# Patient Record
Sex: Male | Born: 1935 | Race: White | Hispanic: No | Marital: Married | Smoking: Former smoker
Health system: Southern US, Community
[De-identification: ages and names within clinical notes are randomized; demographics above are authoritative.]

## PROBLEM LIST (undated history)

## (undated) DIAGNOSIS — N390 Urinary tract infection, site not specified: Secondary | ICD-10-CM

## (undated) DIAGNOSIS — I259 Chronic ischemic heart disease, unspecified: Secondary | ICD-10-CM

## (undated) DIAGNOSIS — N400 Enlarged prostate without lower urinary tract symptoms: Secondary | ICD-10-CM

## (undated) DIAGNOSIS — Z515 Encounter for palliative care: Secondary | ICD-10-CM

## (undated) DIAGNOSIS — D649 Anemia, unspecified: Secondary | ICD-10-CM

## (undated) DIAGNOSIS — A419 Sepsis, unspecified organism: Secondary | ICD-10-CM

## (undated) DIAGNOSIS — M25562 Pain in left knee: Secondary | ICD-10-CM

## (undated) DIAGNOSIS — R531 Weakness: Secondary | ICD-10-CM

## (undated) DIAGNOSIS — E785 Hyperlipidemia, unspecified: Secondary | ICD-10-CM

## (undated) DIAGNOSIS — Z89519 Acquired absence of unspecified leg below knee: Secondary | ICD-10-CM

## (undated) DIAGNOSIS — R197 Diarrhea, unspecified: Secondary | ICD-10-CM

## (undated) DIAGNOSIS — K7460 Unspecified cirrhosis of liver: Secondary | ICD-10-CM

## (undated) DIAGNOSIS — I509 Heart failure, unspecified: Secondary | ICD-10-CM

## (undated) DIAGNOSIS — R410 Disorientation, unspecified: Secondary | ICD-10-CM

## (undated) DIAGNOSIS — I10 Essential (primary) hypertension: Secondary | ICD-10-CM

## (undated) DIAGNOSIS — R634 Abnormal weight loss: Secondary | ICD-10-CM

## (undated) DIAGNOSIS — Z95810 Presence of automatic (implantable) cardiac defibrillator: Secondary | ICD-10-CM

## (undated) DIAGNOSIS — I429 Cardiomyopathy, unspecified: Secondary | ICD-10-CM

## (undated) DIAGNOSIS — M1990 Unspecified osteoarthritis, unspecified site: Secondary | ICD-10-CM

## (undated) DIAGNOSIS — I4891 Unspecified atrial fibrillation: Secondary | ICD-10-CM

## (undated) DIAGNOSIS — I2510 Atherosclerotic heart disease of native coronary artery without angina pectoris: Secondary | ICD-10-CM

## (undated) DIAGNOSIS — M25561 Pain in right knee: Secondary | ICD-10-CM

## (undated) DIAGNOSIS — I5022 Chronic systolic (congestive) heart failure: Secondary | ICD-10-CM

## (undated) DIAGNOSIS — K5900 Constipation, unspecified: Secondary | ICD-10-CM

## (undated) DIAGNOSIS — L98429 Non-pressure chronic ulcer of back with unspecified severity: Secondary | ICD-10-CM

## (undated) HISTORY — DX: Chronic systolic (congestive) heart failure: I50.22

## (undated) HISTORY — DX: Diarrhea, unspecified: R19.7

## (undated) HISTORY — DX: Unspecified cirrhosis of liver: K74.60

## (undated) HISTORY — DX: Chronic ischemic heart disease, unspecified: I25.9

## (undated) HISTORY — DX: Acquired absence of unspecified leg below knee: Z89.519

## (undated) HISTORY — DX: Sepsis, unspecified organism: A41.9

## (undated) HISTORY — DX: Urinary tract infection, site not specified: N39.0

## (undated) HISTORY — DX: Constipation, unspecified: K59.00

## (undated) HISTORY — DX: Cardiomyopathy, unspecified: I42.9

## (undated) HISTORY — DX: Disorientation, unspecified: R41.0

## (undated) HISTORY — PX: CARDIAC CATHETERIZATION: SHX172

## (undated) HISTORY — DX: Pain in right knee: M25.561

## (undated) HISTORY — DX: Weakness: R53.1

## (undated) HISTORY — DX: Anemia, unspecified: D64.9

## (undated) HISTORY — DX: Abnormal weight loss: R63.4

## (undated) HISTORY — DX: Benign prostatic hyperplasia without lower urinary tract symptoms: N40.0

## (undated) HISTORY — PX: CHOLECYSTECTOMY: SHX55

## (undated) HISTORY — DX: Pain in left knee: M25.562

## (undated) HISTORY — PX: OTHER SURGICAL HISTORY: SHX169

---

## 1997-09-13 ENCOUNTER — Encounter (HOSPITAL_COMMUNITY): Admission: RE | Admit: 1997-09-13 | Discharge: 1997-12-12 | Payer: Self-pay | Admitting: Cardiology

## 1997-12-14 ENCOUNTER — Encounter (HOSPITAL_COMMUNITY): Admission: RE | Admit: 1997-12-14 | Discharge: 1998-03-14 | Payer: Self-pay | Admitting: Cardiology

## 1998-03-15 ENCOUNTER — Encounter (HOSPITAL_COMMUNITY): Admission: RE | Admit: 1998-03-15 | Discharge: 1998-06-13 | Payer: Self-pay | Admitting: Cardiology

## 1998-06-14 ENCOUNTER — Encounter (HOSPITAL_COMMUNITY): Admission: RE | Admit: 1998-06-14 | Discharge: 1998-09-12 | Payer: Self-pay | Admitting: Cardiology

## 1998-09-13 ENCOUNTER — Encounter (HOSPITAL_COMMUNITY): Admission: RE | Admit: 1998-09-13 | Discharge: 1998-12-12 | Payer: Self-pay | Admitting: Cardiology

## 1998-12-13 ENCOUNTER — Encounter (HOSPITAL_COMMUNITY): Admission: RE | Admit: 1998-12-13 | Discharge: 1999-03-13 | Payer: Self-pay | Admitting: Cardiology

## 2001-02-15 ENCOUNTER — Inpatient Hospital Stay (HOSPITAL_COMMUNITY): Admission: EM | Admit: 2001-02-15 | Discharge: 2001-03-04 | Payer: Self-pay

## 2001-02-17 ENCOUNTER — Encounter: Payer: Self-pay | Admitting: Internal Medicine

## 2001-02-20 ENCOUNTER — Encounter: Payer: Self-pay | Admitting: Internal Medicine

## 2001-02-22 ENCOUNTER — Encounter: Payer: Self-pay | Admitting: *Deleted

## 2001-02-28 ENCOUNTER — Encounter: Payer: Self-pay | Admitting: *Deleted

## 2001-03-04 ENCOUNTER — Inpatient Hospital Stay: Admission: RE | Admit: 2001-03-04 | Discharge: 2001-03-11 | Payer: Self-pay | Admitting: Internal Medicine

## 2001-03-10 ENCOUNTER — Ambulatory Visit (HOSPITAL_COMMUNITY): Admission: RE | Admit: 2001-03-10 | Discharge: 2001-03-10 | Payer: Self-pay | Admitting: Internal Medicine

## 2001-03-11 ENCOUNTER — Inpatient Hospital Stay (HOSPITAL_COMMUNITY): Admission: AD | Admit: 2001-03-11 | Discharge: 2001-03-16 | Payer: Self-pay

## 2001-03-16 ENCOUNTER — Inpatient Hospital Stay: Admission: RE | Admit: 2001-03-16 | Discharge: 2001-03-20 | Payer: Self-pay | Admitting: Internal Medicine

## 2001-03-17 ENCOUNTER — Encounter (HOSPITAL_COMMUNITY): Admission: RE | Admit: 2001-03-17 | Discharge: 2001-06-15 | Payer: Self-pay | Admitting: Internal Medicine

## 2001-03-19 ENCOUNTER — Encounter: Payer: Self-pay | Admitting: Gastroenterology

## 2001-03-19 ENCOUNTER — Ambulatory Visit (HOSPITAL_COMMUNITY): Admission: RE | Admit: 2001-03-19 | Discharge: 2001-03-19 | Payer: Self-pay | Admitting: Internal Medicine

## 2001-03-20 ENCOUNTER — Encounter: Payer: Self-pay | Admitting: Internal Medicine

## 2001-03-20 ENCOUNTER — Inpatient Hospital Stay (HOSPITAL_COMMUNITY): Admission: AD | Admit: 2001-03-20 | Discharge: 2001-03-29 | Payer: Self-pay | Admitting: Internal Medicine

## 2001-03-21 ENCOUNTER — Encounter: Payer: Self-pay | Admitting: Internal Medicine

## 2001-03-22 ENCOUNTER — Encounter: Payer: Self-pay | Admitting: Gastroenterology

## 2001-03-28 ENCOUNTER — Encounter: Payer: Self-pay | Admitting: Internal Medicine

## 2001-03-29 ENCOUNTER — Observation Stay (HOSPITAL_COMMUNITY): Admission: EM | Admit: 2001-03-29 | Discharge: 2001-03-30 | Payer: Self-pay

## 2001-04-06 ENCOUNTER — Ambulatory Visit (HOSPITAL_COMMUNITY): Admission: RE | Admit: 2001-04-06 | Discharge: 2001-04-06 | Payer: Self-pay | Admitting: Family Medicine

## 2001-04-06 ENCOUNTER — Encounter: Payer: Self-pay | Admitting: Family Medicine

## 2001-04-20 ENCOUNTER — Encounter: Payer: Self-pay | Admitting: Family Medicine

## 2001-04-20 ENCOUNTER — Ambulatory Visit (HOSPITAL_COMMUNITY): Admission: RE | Admit: 2001-04-20 | Discharge: 2001-04-20 | Payer: Self-pay | Admitting: Family Medicine

## 2001-04-26 ENCOUNTER — Encounter: Admission: RE | Admit: 2001-04-26 | Discharge: 2001-07-25 | Payer: Self-pay | Admitting: Family Medicine

## 2001-05-05 ENCOUNTER — Encounter: Payer: Self-pay | Admitting: Family Medicine

## 2001-05-05 ENCOUNTER — Ambulatory Visit (HOSPITAL_COMMUNITY): Admission: RE | Admit: 2001-05-05 | Discharge: 2001-05-05 | Payer: Self-pay | Admitting: Family Medicine

## 2001-06-03 ENCOUNTER — Encounter: Payer: Self-pay | Admitting: Family Medicine

## 2001-06-03 ENCOUNTER — Ambulatory Visit (HOSPITAL_COMMUNITY): Admission: RE | Admit: 2001-06-03 | Discharge: 2001-06-03 | Payer: Self-pay | Admitting: Family Medicine

## 2001-07-28 ENCOUNTER — Encounter: Payer: Self-pay | Admitting: Gastroenterology

## 2001-07-28 ENCOUNTER — Ambulatory Visit (HOSPITAL_COMMUNITY): Admission: RE | Admit: 2001-07-28 | Discharge: 2001-07-28 | Payer: Self-pay | Admitting: Gastroenterology

## 2011-10-26 ENCOUNTER — Emergency Department (HOSPITAL_COMMUNITY): Payer: Medicare Other

## 2011-10-26 ENCOUNTER — Inpatient Hospital Stay (HOSPITAL_COMMUNITY)
Admission: EM | Admit: 2011-10-26 | Discharge: 2011-10-29 | DRG: 293 | Disposition: A | Discharge status: Home / Self Care | Payer: Medicare Other | Attending: Internal Medicine | Admitting: Internal Medicine

## 2011-10-26 ENCOUNTER — Encounter (HOSPITAL_COMMUNITY): Payer: Self-pay | Admitting: *Deleted

## 2011-10-26 DIAGNOSIS — I509 Heart failure, unspecified: Secondary | ICD-10-CM | POA: Diagnosis present

## 2011-10-26 DIAGNOSIS — I4891 Unspecified atrial fibrillation: Secondary | ICD-10-CM

## 2011-10-26 DIAGNOSIS — E785 Hyperlipidemia, unspecified: Secondary | ICD-10-CM

## 2011-10-26 DIAGNOSIS — D649 Anemia, unspecified: Secondary | ICD-10-CM | POA: Diagnosis present

## 2011-10-26 DIAGNOSIS — M7989 Other specified soft tissue disorders: Secondary | ICD-10-CM

## 2011-10-26 DIAGNOSIS — E118 Type 2 diabetes mellitus with unspecified complications: Secondary | ICD-10-CM

## 2011-10-26 DIAGNOSIS — R0602 Shortness of breath: Secondary | ICD-10-CM

## 2011-10-26 DIAGNOSIS — I5023 Acute on chronic systolic (congestive) heart failure: Principal | ICD-10-CM | POA: Diagnosis present

## 2011-10-26 DIAGNOSIS — E119 Type 2 diabetes mellitus without complications: Secondary | ICD-10-CM | POA: Diagnosis present

## 2011-10-26 DIAGNOSIS — E1165 Type 2 diabetes mellitus with hyperglycemia: Secondary | ICD-10-CM

## 2011-10-26 DIAGNOSIS — I10 Essential (primary) hypertension: Secondary | ICD-10-CM | POA: Diagnosis present

## 2011-10-26 HISTORY — DX: Unspecified atrial fibrillation: I48.91

## 2011-10-26 HISTORY — DX: Essential (primary) hypertension: I10

## 2011-10-26 HISTORY — DX: Hyperlipidemia, unspecified: E78.5

## 2011-10-26 HISTORY — DX: Atherosclerotic heart disease of native coronary artery without angina pectoris: I25.10

## 2011-10-26 LAB — DIFFERENTIAL
Basophils Absolute: 0 10*3/uL (ref 0.0–0.1)
Basophils Relative: 0 % (ref 0–1)
Eosinophils Absolute: 0.1 10*3/uL (ref 0.0–0.7)
Eosinophils Relative: 1 % (ref 0–5)
Monocytes Absolute: 0.5 10*3/uL (ref 0.1–1.0)
Neutro Abs: 7.4 10*3/uL (ref 1.7–7.7)

## 2011-10-26 LAB — HEPATIC FUNCTION PANEL
Alkaline Phosphatase: 59 U/L (ref 39–117)
Bilirubin, Direct: 0.2 mg/dL (ref 0.0–0.3)
Indirect Bilirubin: 0.7 mg/dL (ref 0.3–0.9)
Total Protein: 7.5 g/dL (ref 6.0–8.3)

## 2011-10-26 LAB — CBC
HCT: 37.8 % — ABNORMAL LOW (ref 39.0–52.0)
MCH: 29.2 pg (ref 26.0–34.0)
MCHC: 32 g/dL (ref 30.0–36.0)
MCV: 91.1 fL (ref 78.0–100.0)
RDW: 14.2 % (ref 11.5–15.5)

## 2011-10-26 LAB — CARDIAC PANEL(CRET KIN+CKTOT+MB+TROPI): Troponin I: <0.3 ng/mL (ref <0.30)

## 2011-10-26 LAB — PRO B NATRIURETIC PEPTIDE: Pro B Natriuretic peptide (BNP): 2287 pg/mL — ABNORMAL HIGH (ref 0–450)

## 2011-10-26 LAB — COMPREHENSIVE METABOLIC PANEL
AST: 14 U/L (ref 0–37)
Albumin: 3.4 g/dL — ABNORMAL LOW (ref 3.5–5.2)
BUN: 21 mg/dL (ref 6–23)
Calcium: 8.7 mg/dL (ref 8.4–10.5)
Creatinine, Ser: 0.59 mg/dL (ref 0.50–1.35)
Total Protein: 7.3 g/dL (ref 6.0–8.3)

## 2011-10-26 LAB — PROTIME-INR
INR: 2.23 — ABNORMAL HIGH (ref 0.00–1.49)
Prothrombin Time: 25.1 seconds — ABNORMAL HIGH (ref 11.6–15.2)

## 2011-10-26 LAB — POCT I-STAT TROPONIN I: Troponin i, poc: 0.03 ng/mL (ref 0.00–0.08)

## 2011-10-26 LAB — D-DIMER, QUANTITATIVE: D-Dimer, Quant: 0.96 ug/mL-FEU — ABNORMAL HIGH (ref 0.00–0.48)

## 2011-10-26 MED ORDER — SILODOSIN 8 MG PO CAPS
8.0000 mg | ORAL_CAPSULE | Freq: Every day | ORAL | Status: DC
  Administered 2011-10-27 – 2011-10-29 (×3): 8 mg via ORAL
  Filled 2011-10-26 (×4): qty 1

## 2011-10-26 MED ORDER — WARFARIN - PHARMACIST DOSING INPATIENT: Freq: Every day | Status: DC

## 2011-10-26 MED ORDER — WARFARIN SODIUM 5 MG PO TABS: 5.0000 mg | ORAL_TABLET | ORAL | Status: DC

## 2011-10-26 MED ORDER — ISOSORBIDE MONONITRATE ER 30 MG PO TB24
30.0000 mg | ORAL_TABLET | Freq: Every day | ORAL | Status: DC
  Administered 2011-10-27 – 2011-10-29 (×3): 30 mg via ORAL
  Filled 2011-10-26 (×3): qty 1

## 2011-10-26 MED ORDER — NITROGLYCERIN 2 % TD OINT
0.5000 [in_us] | TOPICAL_OINTMENT | Freq: Three times a day (TID) | TRANSDERMAL | Status: DC
  Administered 2011-10-26 – 2011-10-27 (×3): 0.5 [in_us] via TOPICAL
  Filled 2011-10-26 (×2): qty 30

## 2011-10-26 MED ORDER — SIMVASTATIN 20 MG PO TABS
20.0000 mg | ORAL_TABLET | Freq: Every evening | ORAL | Status: DC
  Administered 2011-10-27 – 2011-10-29 (×3): 20 mg via ORAL
  Filled 2011-10-26 (×3): qty 1

## 2011-10-26 MED ORDER — SODIUM CHLORIDE 0.9 % IV SOLN
Freq: Once | INTRAVENOUS | Status: AC
  Administered 2011-10-26 (×2): via INTRAVENOUS

## 2011-10-26 MED ORDER — FUROSEMIDE 10 MG/ML IJ SOLN
40.0000 mg | Freq: Two times a day (BID) | INTRAMUSCULAR | Status: DC
  Administered 2011-10-27 – 2011-10-29 (×5): 40 mg via INTRAVENOUS
  Filled 2011-10-26 (×8): qty 4

## 2011-10-26 MED ORDER — ALBUTEROL SULFATE (5 MG/ML) 0.5% IN NEBU
2.5000 mg | INHALATION_SOLUTION | RESPIRATORY_TRACT | Status: DC
  Administered 2011-10-26: 2.5 mg via RESPIRATORY_TRACT

## 2011-10-26 MED ORDER — IPRATROPIUM BROMIDE 0.02 % IN SOLN
0.5000 mg | RESPIRATORY_TRACT | Status: DC
  Administered 2011-10-26: 0.5 mg via RESPIRATORY_TRACT

## 2011-10-26 MED ORDER — FUROSEMIDE 10 MG/ML IJ SOLN
40.0000 mg | Freq: Once | INTRAMUSCULAR | Status: AC
  Administered 2011-10-26: 40 mg via INTRAVENOUS
  Filled 2011-10-26: qty 4

## 2011-10-26 MED ORDER — SODIUM CHLORIDE 0.9 % IJ SOLN: 3.0000 mL | INTRAMUSCULAR | Status: DC | PRN

## 2011-10-26 MED ORDER — DIGOXIN 250 MCG PO TABS
250.0000 ug | ORAL_TABLET | Freq: Every day | ORAL | Status: DC
  Administered 2011-10-27: 250 ug via ORAL
  Filled 2011-10-26: qty 1

## 2011-10-26 MED ORDER — IPRATROPIUM BROMIDE 0.02 % IN SOLN: 0.5000 mg | Freq: Four times a day (QID) | RESPIRATORY_TRACT | Status: DC

## 2011-10-26 MED ORDER — CARVEDILOL 25 MG PO TABS
25.0000 mg | ORAL_TABLET | Freq: Two times a day (BID) | ORAL | Status: DC
  Administered 2011-10-26 – 2011-10-27 (×2): 25 mg via ORAL
  Filled 2011-10-26 (×4): qty 1

## 2011-10-26 MED ORDER — WARFARIN - PHYSICIAN DOSING INPATIENT: Freq: Every day | Status: DC

## 2011-10-26 MED ORDER — SODIUM CHLORIDE 0.9 % IV SOLN
250.0000 mL | INTRAVENOUS | Status: DC | PRN
  Administered 2011-10-28: 250 mL via INTRAVENOUS

## 2011-10-26 MED ORDER — ONDANSETRON HCL 4 MG/2ML IJ SOLN: 4.0000 mg | Freq: Four times a day (QID) | INTRAMUSCULAR | Status: DC | PRN

## 2011-10-26 MED ORDER — ACETAMINOPHEN 650 MG RE SUPP: 650.0000 mg | Freq: Four times a day (QID) | RECTAL | Status: DC | PRN

## 2011-10-26 MED ORDER — IOHEXOL 300 MG/ML  SOLN
100.0000 mL | Freq: Once | INTRAMUSCULAR | Status: AC | PRN
  Administered 2011-10-26: 100 mL via INTRAVENOUS

## 2011-10-26 MED ORDER — ALBUTEROL SULFATE (5 MG/ML) 0.5% IN NEBU: 2.5000 mg | INHALATION_SOLUTION | RESPIRATORY_TRACT | Status: DC | PRN

## 2011-10-26 MED ORDER — ONDANSETRON HCL 4 MG PO TABS: 4.0000 mg | ORAL_TABLET | Freq: Four times a day (QID) | ORAL | Status: DC | PRN

## 2011-10-26 MED ORDER — ACETAMINOPHEN 325 MG PO TABS
650.0000 mg | ORAL_TABLET | Freq: Four times a day (QID) | ORAL | Status: DC | PRN
Start: 2011-10-26 — End: 2011-10-30

## 2011-10-26 MED ORDER — ALBUTEROL SULFATE (5 MG/ML) 0.5% IN NEBU
2.5000 mg | INHALATION_SOLUTION | Freq: Three times a day (TID) | RESPIRATORY_TRACT | Status: DC
  Administered 2011-10-27 – 2011-10-28 (×6): 2.5 mg via RESPIRATORY_TRACT
  Filled 2011-10-26 (×6): qty 0.5

## 2011-10-26 MED ORDER — SODIUM CHLORIDE 0.9 % IJ SOLN
3.0000 mL | Freq: Two times a day (BID) | INTRAMUSCULAR | Status: DC
  Administered 2011-10-26 – 2011-10-29 (×6): 3 mL via INTRAVENOUS

## 2011-10-26 MED ORDER — ASPIRIN 81 MG PO CHEW
81.0000 mg | CHEWABLE_TABLET | Freq: Every day | ORAL | Status: DC
  Administered 2011-10-27 – 2011-10-29 (×3): 81 mg via ORAL
  Filled 2011-10-26 (×3): qty 1

## 2011-10-26 MED ORDER — ENOXAPARIN SODIUM 40 MG/0.4ML ~~LOC~~ SOLN: 40.0000 mg | SUBCUTANEOUS | Status: DC

## 2011-10-26 MED ORDER — METHYLPREDNISOLONE SODIUM SUCC 125 MG IJ SOLR
60.0000 mg | Freq: Two times a day (BID) | INTRAMUSCULAR | Status: DC
  Administered 2011-10-26 – 2011-10-29 (×6): 60 mg via INTRAVENOUS
  Filled 2011-10-26 (×4): qty 0.96
  Filled 2011-10-26: qty 2
  Filled 2011-10-26 (×3): qty 0.96

## 2011-10-26 MED ORDER — FERROUS SULFATE 325 (65 FE) MG PO TABS
325.0000 mg | ORAL_TABLET | Freq: Every day | ORAL | Status: DC
  Administered 2011-10-27 – 2011-10-29 (×3): 325 mg via ORAL
  Filled 2011-10-26 (×4): qty 1

## 2011-10-26 MED ORDER — WARFARIN SODIUM 7.5 MG PO TABS: 7.5000 mg | ORAL_TABLET | ORAL | Status: DC

## 2011-10-26 MED ORDER — WARFARIN SODIUM 10 MG PO TABS: 10.0000 mg | ORAL_TABLET | ORAL | Status: DC

## 2011-10-26 MED ORDER — IPRATROPIUM BROMIDE 0.02 % IN SOLN
0.5000 mg | Freq: Three times a day (TID) | RESPIRATORY_TRACT | Status: DC
  Administered 2011-10-27 – 2011-10-28 (×6): 0.5 mg via RESPIRATORY_TRACT
  Filled 2011-10-26 (×6): qty 2.5

## 2011-10-26 MED ORDER — GLIPIZIDE ER 10 MG PO TB24
10.0000 mg | ORAL_TABLET | Freq: Two times a day (BID) | ORAL | Status: DC
  Administered 2011-10-27 – 2011-10-29 (×6): 10 mg via ORAL
  Filled 2011-10-26 (×7): qty 1

## 2011-10-26 NOTE — ED Provider Notes (Signed)
4:51 PM Patient (and his wife) informed of all results, including CTA.  Given his Hx of HF, and elevated bnp, he was provided lasix.  He was admitted to the hospitalist service.  Gerhard Munch, MD 10/26/11 [phone]

## 2011-10-26 NOTE — ED Provider Notes (Signed)
History     CSN: [id number]  Arrival date & time 10/26/11  1335   First MD Initiated Contact with Patient 10/26/11 1345      Chief Complaint  Patient presents with  . Shortness of Breath    (Consider location/radiation/quality/duration/timing/severity/associated sxs/prior treatment) Patient is a 78 y.o. male presenting with shortness of breath. The history is provided by the patient and the spouse.  Shortness of Breath  Associated symptoms include shortness of breath.   patient here with shortness of breath orthopnea and dyspnea on exertion x3 days. No chest pain or chest pressure. No fever but nonproductive cough noted. History of CHF in the past, saw his PCP today and x-ray in the office concerning for CHF and patient center for evaluation. No recent change in his medications noted. Denies any syncope or presyncopal events. No palpitations. He also has a history of A. fib and is on Coumadin denies any recent black or bloody stools  Past Medical History  Diagnosis Date  . Atrial fibrillation   . Diabetes mellitus   . Hypertension   . Coronary artery disease   . Hyperlipemia     Past Surgical History  Procedure Date  . Cholecystectomy     No family history on file.  History  Substance Use Topics  . Smoking status: Never Smoker   . Smokeless tobacco: Not on file  . Alcohol Use: No      Review of Systems  Respiratory: Positive for shortness of breath.   All other systems reviewed and are negative.    Allergies  Review of patient's allergies indicates not on file.  Home Medications  No current outpatient prescriptions on file.  BP 154/61  Pulse 55  Temp(Src) 97.2 F (36.2 C) (Oral)  Resp 22  SpO2 91%  Physical Exam  Nursing note and vitals reviewed. Constitutional: He is oriented to person, place, and time. He appears well-developed and well-nourished.  Non-toxic appearance. No distress.  HENT:  Head: Normocephalic and atraumatic.  Eyes: Conjunctivae,  EOM and lids are normal. Pupils are equal, round, and reactive to light.  Neck: Normal range of motion. Neck supple. No tracheal deviation present. No mass present.  Cardiovascular: Normal rate, regular rhythm and normal heart sounds.  Exam reveals no gallop.   No murmur heard. Pulmonary/Chest: Effort normal. No stridor. No respiratory distress. He has no decreased breath sounds. He has no wheezes. He has rhonchi. He has no rales.  Abdominal: Soft. Normal appearance and bowel sounds are normal. He exhibits no distension. There is no tenderness. There is no rebound and no CVA tenderness.  Musculoskeletal: Normal range of motion. He exhibits no edema and no tenderness.       3+ bilateral lower extremity pitting edema  Neurological: He is alert and oriented to person, place, and time. He has normal strength. No cranial nerve deficit or sensory deficit. GCS eye subscore is 4. GCS verbal subscore is 5. GCS motor subscore is 6.  Skin: Skin is warm and dry. No abrasion and no rash noted.  Psychiatric: He has a normal mood and affect. His speech is normal and behavior is normal.    ED Course  Procedures (including critical care time)   Labs Reviewed  CBC  DIFFERENTIAL  COMPREHENSIVE METABOLIC PANEL  PRO B NATRIURETIC PEPTIDE  PROTIME-INR  DIGOXIN LEVEL   No results found.   No diagnosis found.    MDM   Date: 10/26/2011  Rate: 62   Rhythm: atrial fibrillation  QRS Axis:  normal  Intervals: normal  ST/T Wave abnormalities: nonspecific T wave changes  Conduction Disutrbances:afib  Narrative Interpretation:   Old EKG Reviewed: changes noted  3:53 PM cxr without signs of severe chf, D-dimer elevated,Chest ct pending, dr. Jeraldine Loots to dispo       Toy Baker, MD 10/26/11 1555

## 2011-10-26 NOTE — ED Notes (Signed)
Per ems: pt went to pcp today for sob and productive cough. Pt denies any pain. Pt states edmd did a xray at stated he had chf and need to come to the ed

## 2011-10-26 NOTE — ED Notes (Signed)
WUJ:[id number]<JY> Expected date:10/26/11<BR> Expected time: 1:37 PM<BR> Means of arrival:Ambulance<BR> Comments:<BR> afib

## 2011-10-26 NOTE — Progress Notes (Signed)
ANTICOAGULATION CONSULT NOTE - Initial Consult  Pharmacy Consult for Warfarin Indication: atrial fibrillation  No Known Allergies  Patient Measurements:    Vital Signs: Temp: 97.2 F (36.2 C) (05/13 1356) Temp src: Oral (05/13 1356) BP: 154/61 mmHg (05/13 1356) Pulse Rate: 55  (05/13 1356)  Labs:  Basename 10/26/11 1440  HGB 12.1*  HCT 37.8*  PLT 248  APTT --  LABPROT 25.1*  INR 2.23*  HEPARINUNFRC --  CREATININE 0.59  CKTOTAL --  CKMB --  TROPONINI --    CrCl is unknown because there is no height on file for the current visit.   Medical History: Past Medical History  Diagnosis Date  . Atrial fibrillation   . Diabetes mellitus   . Hypertension   . Coronary artery disease   . Hyperlipemia     Assessment:  79 YOM on chronic coumadin for atrial fibrillation.    Home warfarin dose: 7.5 mg daily except takes 10 mg on Mondays/Fridays.  INR therapeutic on admission.  Patient has already taken dose for today (5/13).  CBC ok. No bleeding/complications reported.   Goal of Therapy:  INR 2-3   Plan:  No coumadin dose today since patient has already taken dose at home today. Daily INR.  Clance Boll 10/26/2011,5:06 PM

## 2011-10-26 NOTE — Progress Notes (Signed)
VASCULAR LAB PRELIMINARY  PRELIMINARY  PRELIMINARY  PRELIMINARY  Bilateral lower extremity venous duplex completed.    Preliminary report:  Bilateral:  No evidence of DVT, superficial thrombosis, or Baker's Cyst. Moderate interstitial fluid noted throughout the right lower extremity and the left popliteal fossa to the ankle.   Sanyla Summey D, RVS 10/26/2011, 7:56 PM

## 2011-10-26 NOTE — H&P (Signed)
Troy Cook MRN: [id number] DOB/AGE: 76-Aug-1933 76 y.o. Primary Care Physician: Dr. Ephriam Knuckles Admit date: 10/26/2011 Chief Complaint: Shortness of breath   HPI: 78 year old male who presents with shortness of breath. He presents with dyspnea on exertion for the last 3 days. He denies any chest pain. He denies any fever any chills any productive cough. The patient has noticed 2 pillow. Orthopnea and dependent edema . No episodes of syncope or near-syncope.   The patient does have a history of atrial fibrillation which is rate controlled. He is on anticoagulation but denies any melena or hematochezia.     Past Medical History  Diagnosis Date  . Atrial fibrillation   . Diabetes mellitus   . Hypertension   . Coronary artery disease   . Hyperlipemia     Past Surgical History  Procedure Date  . Cholecystectomy     Prior to Admission medications   Medication Sig Start Date End Date Taking? Authorizing Provider  aspirin 81 MG tablet Take 81 mg by mouth daily.   Yes Historical Provider, MD  carvedilol (COREG) 25 MG tablet Take 25 mg by mouth 2 (two) times daily.   Yes Historical Provider, MD  digoxin (LANOXIN) 0.25 MG tablet Take 250 mcg by mouth daily.   Yes Historical Provider, MD  ferrous sulfate 325 (65 FE) MG tablet Take 325 mg by mouth daily with breakfast.   Yes Historical Provider, MD  glipiZIDE (GLUCOTROL XL) 10 MG 24 hr tablet Take 10 mg by mouth 2 (two) times daily.   Yes Historical Provider, MD  isosorbide mononitrate (IMDUR) 30 MG 24 hr tablet Take 30 mg by mouth daily.   Yes Historical Provider, MD  metFORMIN (GLUCOPHAGE) 500 MG tablet Take 1,000 mg by mouth 2 (two) times daily.   Yes Historical Provider, MD  ramipril (ALTACE) 10 MG capsule Take 10 mg by mouth 2 (two) times daily.   Yes Historical Provider, MD  silodosin (RAPAFLO) 8 MG CAPS capsule Take 8 mg by mouth daily with breakfast.   Yes Historical Provider, MD  simvastatin (ZOCOR) 20 MG tablet Take 20 mg by mouth  every evening.   Yes Historical Provider, MD  warfarin (COUMADIN) 5 MG tablet Take 5 mg by mouth See admin instructions. Pt takes 1 and 1/2 tablets 7.5 mg daily except, takes 10 mg 2 tablets on Monday and Friday   Yes Historical Provider, MD    Allergies: No Known Allergies  History reviewed. No pertinent family history.  Social History:  reports that he has never smoked. He has never used smokeless tobacco. He reports that he does not drink alcohol. His drug history not on file.         JXB:JYNWGN of Systems a complete 14 point review of systems was done and pertinent positives listed in Hpi Respiratory: Positive for shortness of breath.  All other systems reviewed and are negative.  PHYSICAL EXAM: Blood pressure 154/61, pulse 55, temperature 97.2 F (36.2 C), temperature source Oral, resp. rate 22, SpO2 100.00%. Nursing note and vitals reviewed.  Constitutional: He is oriented to person, place, and time. He appears well-developed and well-nourished. Non-toxic appearance. No distress.  HENT:  Head: Normocephalic and atraumatic.  Eyes: Conjunctivae, EOM and lids are normal. Pupils are equal, round, and reactive to light.  Neck: Normal range of motion. Neck supple. No tracheal deviation present. No mass present.  Cardiovascular: Normal rate, regular rhythm and normal heart sounds. Exam reveals no gallop.  No murmur heard.  Pulmonary/Chest: Effort normal. No  stridor. No respiratory distress. He has no decreased breath sounds. He has no wheezes. He has rhonchi. He has no rales.  Abdominal: Soft. Normal appearance and bowel sounds are normal. He exhibits no distension. There is no tenderness. There is no rebound and no CVA tenderness.  Musculoskeletal: Normal range of motion. He exhibits no edema and no tenderness.  3+ bilateral lower extremity pitting edema  Neurological: He is alert and oriented to person, place, and time. He has normal strength. No cranial nerve deficit or sensory  deficit. GCS eye subscore is 4. GCS verbal subscore is 5. GCS motor subscore is 6.  Skin: Skin is warm and dry. No abrasion and no rash noted.  Psychiatric: He has a normal mood and affect. His speech is normal and behavior is normal.    No results found for this or any previous visit (from the past 240 hour(s)).   Results for orders placed during the hospital encounter of 10/26/11 (from the past 48 hour(s))  CBC     Status: Abnormal   Collection Time   10/26/11  2:40 PM      Component Value Range Comment   WBC 9.5  4.0 - 10.5 (K/uL)    RBC 4.15 (*) 4.22 - 5.81 (MIL/uL)    Hemoglobin 12.1 (*) 13.0 - 17.0 (g/dL)    HCT 13.0 (*) 86.5 - 52.0 (%)    MCV 91.1  78.0 - 100.0 (fL)    MCH 29.2  26.0 - 34.0 (pg)    MCHC 32.0  30.0 - 36.0 (g/dL)    RDW 78.4  69.6 - 29.5 (%)    Platelets 248  150 - 400 (K/uL)   DIFFERENTIAL     Status: Abnormal   Collection Time   10/26/11  2:40 PM      Component Value Range Comment   Neutrophils Relative 79 (*) 43 - 77 (%)    Neutro Abs 7.4  1.7 - 7.7 (K/uL)    Lymphocytes Relative 15  12 - 46 (%)    Lymphs Abs 1.5  0.7 - 4.0 (K/uL)    Monocytes Relative 5  3 - 12 (%)    Monocytes Absolute 0.5  0.1 - 1.0 (K/uL)    Eosinophils Relative 1  0 - 5 (%)    Eosinophils Absolute 0.1  0.0 - 0.7 (K/uL)    Basophils Relative 0  0 - 1 (%)    Basophils Absolute 0.0  0.0 - 0.1 (K/uL)   COMPREHENSIVE METABOLIC PANEL     Status: Abnormal   Collection Time   10/26/11  2:40 PM      Component Value Range Comment   Sodium 137  135 - 145 (mEq/L)    Potassium 4.5  3.5 - 5.1 (mEq/L)    Chloride 101  96 - 112 (mEq/L)    CO2 29  19 - 32 (mEq/L)    Glucose, Bld 124 (*) 70 - 99 (mg/dL)    BUN 21  6 - 23 (mg/dL)    Creatinine, Ser 2.84  0.50 - 1.35 (mg/dL)    Calcium 8.7  8.4 - 10.5 (mg/dL)    Total Protein 7.3  6.0 - 8.3 (g/dL)    Albumin 3.4 (*) 3.5 - 5.2 (g/dL)    AST 14  0 - 37 (U/L)    ALT 11  0 - 53 (U/L)    Alkaline Phosphatase 56  39 - 117 (U/L)    Total Bilirubin  0.8  0.3 - 1.2 (mg/dL)  GFR calc non Af Amer >90  >90 (mL/min)    GFR calc Af Amer >90  >90 (mL/min)   PRO B NATRIURETIC PEPTIDE     Status: Abnormal   Collection Time   10/26/11  2:40 PM      Component Value Range Comment   Pro B Natriuretic peptide (BNP) 2287.0 (*) 0 - 450 (pg/mL)   PROTIME-INR     Status: Abnormal   Collection Time   10/26/11  2:40 PM      Component Value Range Comment   Prothrombin Time 25.1 (*) 11.6 - 15.2 (seconds)    INR 2.23 (*) 0.00 - 1.49    DIGOXIN LEVEL     Status: Normal   Collection Time   10/26/11  2:40 PM      Component Value Range Comment   Digoxin Level 1.4  0.8 - 2.0 (ng/mL)   D-DIMER, QUANTITATIVE     Status: Abnormal   Collection Time   10/26/11  2:40 PM      Component Value Range Comment   D-Dimer, Quant 0.96 (*) 0.00 - 0.48 (ug/mL-FEU)   POCT I-STAT TROPONIN I     Status: Normal   Collection Time   10/26/11  3:05 PM      Component Value Range Comment   Troponin i, poc 0.03  0.00 - 0.08 (ng/mL)    Comment 3              Dg Chest 2 View  10/26/2011  *RADIOLOGY REPORT*  Clinical Data: Shortness breath with dizziness.  History of diabetes.  CHEST - 2 VIEW  Comparison: 10/26/2011 and reports from remote examinations prior to 2002.  Findings: The heart size and mediastinal contours are stable.  Low lung volumes and diffuse interstitial prominence are similar to the available prior examination from earlier today. No focal airspace disease or significant pleural effusion is identified. Cholecystectomy clips and bilateral glenohumeral degenerative changes are noted.  IMPRESSION: Suspected chronic interstitial lung disease.  No definite acute superimposed process.  Original Report Authenticated By: Gerrianne Scale, M.D.   Ct Angio Chest W/cm &/or Wo Cm  10/26/2011  *RADIOLOGY REPORT*  Clinical Data: History of CHF, now with low oxygen saturation shortness of breath, evaluate for pulmonary embolism  CT ANGIOGRAPHY CHEST  Technique:  Multidetector CT  imaging of the chest using the standard protocol during bolus administration of intravenous contrast. Multiplanar reconstructed images including MIPs were obtained and reviewed to evaluate the vascular anatomy.  Contrast: OMNIPAQUE IOHEXOL 300 MG/ML  SOLN  Comparison: Chest radiograph - 10/26/2011  Findings:  There is adequate opacification of the pulmonary vasculature with the main pulmonary artery measuring 250 HU.  No discrete filling defects within the pulmonary vasculature to suggest acute pulmonary embolism.  Caliber of the main pulmonary artery is enlarged measuring 3.5 cm in diameter.  Cardiomegaly with apparent enlargement of the right atrium.  No pericardial effusion.  Coronary artery calcifications.  Normal caliber of the thoracic aorta.  Scattered atherosclerotic calcifications within a normal caliber thoracic aorta.  Normal configuration of the aortic arch.  No definite periaortic stranding.  Nonvascular findings:  Small bilateral pleural effusions, right greater than left. Bibasilar dependent opacities, right greater than left. Calcifications within the bilateral bronchi.  Mild air trapping is suspected.  No pneumothorax.  Nonspecific borderline mediastinal adenopathy with index prevascular lymph node measuring 1 cm in short axis diameter (image 35, series four) and index precarinal lymph node measuring 1.2 cm in short axis diameter (image  38).  No definite hilar or axillary lymphadenopathy.  Early arterial phase evaluation of the upper abdomen demonstrates a large ventral abdominal wall containing a portion of nondilated loop of transverse colon, incompletely imaged.  Post cholecystectomy.  Extensive vascular calcifications within the splenic artery.  No acute or aggressive osseous abnormalities.  Thoracic spine degenerative change  IMPRESSION:  1.  Negative for pulmonary embolism.  2.  Overall findings compatible with pulmonary edema with small bilateral effusions and bibasilar opacities, right  greater than left, atelectasis versus infiltrate.  3.  Apparent enlargement of the right atrium and main pulmonary artery, nonspecific though may be seen in the setting of pulmonary arterial hypertension.  Further evaluation with cardiac echo may be performed as clinically indicated.  4. Nonspecific mediastinal lymphadenopathy, possibly reactive in etiology.  Original Report Authenticated By: Waynard Reeds, M.D.    Impression:  Principal Problem:  *Congestive heart failure Active Problems:  Normocytic anemia  Shortness of breath Atrial fibrillation    Plan: #1 shortness of breath the patient has been ruled out for PE. The patient's CT scan is consistent with congestive heart failure, the patient will be admitted to telemetry, cycle cardiac enzymes, check thyroid function, diuresing with IV Lasix, monitor renal function closely, hold off on ACE inhibitor until the echo is resulted.  #2 Hypertension the patient is on Coreg which will be continued  #3 atrial fibrillation the patient is rate controlled at this time continue Coreg continue digoxin, continue anticoagulation per pharmacy   #4 diabetes the patient will be placed on sliding scale insulin will continue glipizide and hold metformin     Kalianna Verbeke 10/26/2011, 4:52 PM

## 2011-10-26 NOTE — ED Notes (Signed)
Pt using urinal. Will attempt for labs once finished.

## 2011-10-27 DIAGNOSIS — N179 Acute kidney failure, unspecified: Secondary | ICD-10-CM

## 2011-10-27 DIAGNOSIS — R4182 Altered mental status, unspecified: Secondary | ICD-10-CM

## 2011-10-27 DIAGNOSIS — M109 Gout, unspecified: Secondary | ICD-10-CM

## 2011-10-27 LAB — CARDIAC PANEL(CRET KIN+CKTOT+MB+TROPI)
Relative Index: INVALID (ref 0.0–2.5)
Troponin I: <0.3 ng/mL (ref <0.30)

## 2011-10-27 LAB — HEMOGLOBIN A1C
Hgb A1c MFr Bld: 7.7 % — ABNORMAL HIGH (ref <5.7)
Mean Plasma Glucose: 174 mg/dL — ABNORMAL HIGH (ref <117)

## 2011-10-27 LAB — COMPREHENSIVE METABOLIC PANEL
ALT: 10 U/L (ref 0–53)
AST: 13 U/L (ref 0–37)
Albumin: 3.2 g/dL — ABNORMAL LOW (ref 3.5–5.2)
Alkaline Phosphatase: 55 U/L (ref 39–117)
BUN: 26 mg/dL — ABNORMAL HIGH (ref 6–23)
Chloride: 102 mEq/L (ref 96–112)
Potassium: 4.4 mEq/L (ref 3.5–5.1)
Sodium: 139 mEq/L (ref 135–145)
Total Bilirubin: 0.8 mg/dL (ref 0.3–1.2)
Total Protein: 6.9 g/dL (ref 6.0–8.3)

## 2011-10-27 LAB — CBC
HCT: 37.9 % — ABNORMAL LOW (ref 39.0–52.0)
MCHC: 31.9 g/dL (ref 30.0–36.0)
Platelets: 233 10*3/uL (ref 150–400)
RDW: 14.2 % (ref 11.5–15.5)
WBC: 6.6 10*3/uL (ref 4.0–10.5)

## 2011-10-27 LAB — GLUCOSE, CAPILLARY: Glucose-Capillary: 186 mg/dL — ABNORMAL HIGH (ref 70–99)

## 2011-10-27 LAB — TSH: TSH: 1.544 u[IU]/mL (ref 0.350–4.500)

## 2011-10-27 MED ORDER — WARFARIN SODIUM 7.5 MG PO TABS
7.5000 mg | ORAL_TABLET | Freq: Once | ORAL | Status: AC
  Administered 2011-10-27: 7.5 mg via ORAL
  Filled 2011-10-27: qty 1

## 2011-10-27 NOTE — Progress Notes (Signed)
Inpatient Diabetes Program Recommendations  AACE/ADA: New Consensus Statement on Inpatient Glycemic Control  Target Ranges:  Prepandial:   less than 140 mg/dL      Peak postprandial:   less than 180 mg/dL (1-2 hours)      Critically ill patients:  140 - 180 mg/dL  Pager:  [phone] Hours:  8 am-10pm   Reason for Visit: Elevated lab glucose while on steroids and history of Diabetes  Inpatient Diabetes Program Recommendations Correction (SSI): Add Novolog Correction

## 2011-10-27 NOTE — Care Management Note (Unsigned)
    Page 1 of 1   10/27/2011     12:26:18 PM   CARE MANAGEMENT NOTE 10/27/2011  Patient:  Troy Cook, Troy Cook   Account Number:  [id number]  Date Initiated:  10/27/2011  Documentation initiated by:  Lanier Clam  Subjective/Objective Assessment:   ADMITTED W/SOB.     Action/Plan:   FROM HOME W/SPOUSE   Anticipated DC Date:  10/30/2011   Anticipated DC Plan:  HOME/SELF CARE         Choice offered to / List presented to:             Status of service:   Medicare Important Message given?   (If response is "NO", the following Medicare IM given date fields will be blank) Date Medicare IM given:   Date Additional Medicare IM given:    Discharge Disposition:    Per UR Regulation:  Reviewed for med. necessity/level of care/duration of stay  If discussed at Long Length of Stay Meetings, dates discussed:    Comments:  10/27/11 [facility] RN,BSN NCM [phone]

## 2011-10-27 NOTE — Progress Notes (Signed)
ANTICOAGULATION CONSULT NOTE - Follow up Consult  Pharmacy Consult for Warfarin Indication: atrial fibrillation  No Known Allergies  Patient Measurements: Height: 6' (182.9 cm) Weight: 258 lb 9.6 oz (117.3 kg) IBW/kg (Calculated) : 77.6   Vital Signs: Temp: 97.2 F (36.2 C) (05/14 0627) Temp src: Oral (05/14 0627) BP: 147/74 mmHg (05/14 0807) Pulse Rate: 71  (05/14 0807)  Labs:  Basename 10/27/11 0345 10/26/11 2054 10/26/11 1839 10/26/11 1440  HGB 12.1* -- -- 12.1*  HCT 37.9* -- -- 37.8*  PLT 233 -- -- 248  APTT -- -- -- --  LABPROT 24.5* -- 25.2* 25.1*  INR 2.16* -- 2.24* 2.23*  HEPARINUNFRC -- -- -- --  CREATININE 0.84 -- -- 0.59  CKTOTAL 32 38 -- --  CKMB 2.7 3.0 -- --  TROPONINI <0.30 <0.30 -- --    Estimated Creatinine Clearance: 97.4 ml/min (by C-G formula based on Cr of 0.84).   Medical History: Past Medical History  Diagnosis Date  . Atrial fibrillation   . Diabetes mellitus   . Hypertension   . Coronary artery disease   . Hyperlipemia     Assessment:  79 YOM on chronic coumadin for atrial fibrillation  Home warfarin dose: 7.5 mg daily except takes 10 mg on Mondays/Fridays.    INR therapeutic on admission.  No DVT or PE noted.  INR remains therapeutic  CBC wnl;  No bleeding/complications reported  Goal of Therapy:  INR 2-3   Plan:   Warfarin 7.5mg  PO today  Daily INR   Lynann Beaver PharmD, BCPS Pager [phone] 10/27/2011 9:07 AM

## 2011-10-27 NOTE — Progress Notes (Signed)
Subjective: Doing so much better today next line denies any chest pain any shortness of breath  Objective: Vital signs in last 24 hours: Filed Vitals:   10/27/11 0807 10/27/11 0925 10/27/11 1028 10/27/11 1323  BP: 147/74   105/49  Pulse: 71  64 50  Temp:    97.4 F (36.3 C)  TempSrc:    Oral  Resp:    18  Height:      Weight:      SpO2:  96%  97%    Intake/Output Summary (Last 24 hours) at 10/27/11 1349 Last data filed at 10/27/11 1324  Gross per 24 hour  Intake    560 ml  Output   2825 ml  Net  -2265 ml    Weight change:   Constitutional: He is oriented to person, place, and time. He appears well-developed and well-nourished. Non-toxic appearance. No distress.  HENT:  Head: Normocephalic and atraumatic.  Eyes: Conjunctivae, EOM and lids are normal. Pupils are equal, round, and reactive to light.  Neck: Normal range of motion. Neck supple. No tracheal deviation present. No mass present.  Cardiovascular: Normal rate, regular rhythm and normal heart sounds. Exam reveals no gallop.  No murmur heard.  Pulmonary/Chest: Effort normal. No stridor. No respiratory distress. He has no decreased breath sounds. He has no wheezes. He has rhonchi. He has no rales.  Abdominal: Soft. Normal appearance and bowel sounds are normal. He exhibits no distension. There is no tenderness. There is no rebound and no CVA tenderness.  Musculoskeletal: Normal range of motion. He exhibits no edema and no tenderness.  3+ bilateral lower extremity pitting edema  Neurological: He is alert and oriented to person, place, and time. He has normal strength. No cranial nerve deficit or sensory deficit. GCS eye subscore is 4. GCS verbal subscore is 5. GCS motor subscore is 6.  Skin: Skin is warm and dry. No abrasion and no rash noted.  Psychiatric: He has a normal mood and affect. His speech is normal and behavior is normal.   Lab Results: Results for orders placed during the hospital encounter of 10/26/11  (from the past 24 hour(s))  CBC     Status: Abnormal   Collection Time   10/26/11  2:40 PM      Component Value Range   WBC 9.5  4.0 - 10.5 (K/uL)   RBC 4.15 (*) 4.22 - 5.81 (MIL/uL)   Hemoglobin 12.1 (*) 13.0 - 17.0 (g/dL)   HCT 91.4 (*) 78.2 - 52.0 (%)   MCV 91.1  78.0 - 100.0 (fL)   MCH 29.2  26.0 - 34.0 (pg)   MCHC 32.0  30.0 - 36.0 (g/dL)   RDW 95.6  21.3 - 08.6 (%)   Platelets 248  150 - 400 (K/uL)  DIFFERENTIAL     Status: Abnormal   Collection Time   10/26/11  2:40 PM      Component Value Range   Neutrophils Relative 79 (*) 43 - 77 (%)   Neutro Abs 7.4  1.7 - 7.7 (K/uL)   Lymphocytes Relative 15  12 - 46 (%)   Lymphs Abs 1.5  0.7 - 4.0 (K/uL)   Monocytes Relative 5  3 - 12 (%)   Monocytes Absolute 0.5  0.1 - 1.0 (K/uL)   Eosinophils Relative 1  0 - 5 (%)   Eosinophils Absolute 0.1  0.0 - 0.7 (K/uL)   Basophils Relative 0  0 - 1 (%)   Basophils Absolute 0.0  0.0 - 0.1 (  K/uL)  COMPREHENSIVE METABOLIC PANEL     Status: Abnormal   Collection Time   10/26/11  2:40 PM      Component Value Range   Sodium 137  135 - 145 (mEq/L)   Potassium 4.5  3.5 - 5.1 (mEq/L)   Chloride 101  96 - 112 (mEq/L)   CO2 29  19 - 32 (mEq/L)   Glucose, Bld 124 (*) 70 - 99 (mg/dL)   BUN 21  6 - 23 (mg/dL)   Creatinine, Ser 0.98  0.50 - 1.35 (mg/dL)   Calcium 8.7  8.4 - 11.9 (mg/dL)   Total Protein 7.3  6.0 - 8.3 (g/dL)   Albumin 3.4 (*) 3.5 - 5.2 (g/dL)   AST 14  0 - 37 (U/L)   ALT 11  0 - 53 (U/L)   Alkaline Phosphatase 56  39 - 117 (U/L)   Total Bilirubin 0.8  0.3 - 1.2 (mg/dL)   GFR calc non Af Amer >90  >90 (mL/min)   GFR calc Af Amer >90  >90 (mL/min)  PRO B NATRIURETIC PEPTIDE     Status: Abnormal   Collection Time   10/26/11  2:40 PM      Component Value Range   Pro B Natriuretic peptide (BNP) 2287.0 (*) 0 - 450 (pg/mL)  PROTIME-INR     Status: Abnormal   Collection Time   10/26/11  2:40 PM      Component Value Range   Prothrombin Time 25.1 (*) 11.6 - 15.2 (seconds)   INR 2.23  (*) 0.00 - 1.49   DIGOXIN LEVEL     Status: Normal   Collection Time   10/26/11  2:40 PM      Component Value Range   Digoxin Level 1.4  0.8 - 2.0 (ng/mL)  D-DIMER, QUANTITATIVE     Status: Abnormal   Collection Time   10/26/11  2:40 PM      Component Value Range   D-Dimer, Quant 0.96 (*) 0.00 - 0.48 (ug/mL-FEU)  POCT I-STAT TROPONIN I     Status: Normal   Collection Time   10/26/11  3:05 PM      Component Value Range   Troponin i, poc 0.03  0.00 - 0.08 (ng/mL)   Comment 3           PROTIME-INR     Status: Abnormal   Collection Time   10/26/11  6:39 PM      Component Value Range   Prothrombin Time 25.2 (*) 11.6 - 15.2 (seconds)   INR 2.24 (*) 0.00 - 1.49   HEPATIC FUNCTION PANEL     Status: Normal   Collection Time   10/26/11  8:47 PM      Component Value Range   Total Protein 7.5  6.0 - 8.3 (g/dL)   Albumin 3.5  3.5 - 5.2 (g/dL)   AST 14  0 - 37 (U/L)   ALT 10  0 - 53 (U/L)   Alkaline Phosphatase 59  39 - 117 (U/L)   Total Bilirubin 0.9  0.3 - 1.2 (mg/dL)   Bilirubin, Direct 0.2  0.0 - 0.3 (mg/dL)   Indirect Bilirubin 0.7  0.3 - 0.9 (mg/dL)  TSH     Status: Normal   Collection Time   10/26/11  8:47 PM      Component Value Range   TSH 1.544  0.350 - 4.500 (uIU/mL)  HEMOGLOBIN A1C     Status: Abnormal   Collection Time   10/26/11  8:47 PM  Component Value Range   Hemoglobin A1C 7.7 (*) <5.7 (%)   Mean Plasma Glucose 174 (*) <117 (mg/dL)  CARDIAC PANEL(CRET KIN+CKTOT+MB+TROPI)     Status: Normal   Collection Time   10/26/11  8:54 PM      Component Value Range   Total CK 38  7 - 232 (U/L)   CK, MB 3.0  0.3 - 4.0 (ng/mL)   Troponin I <0.30  <0.30 (ng/mL)   Relative Index RELATIVE INDEX IS INVALID  0.0 - 2.5   GLUCOSE, CAPILLARY     Status: Abnormal   Collection Time   10/26/11 10:05 PM      Component Value Range   Glucose-Capillary 186 (*) 70 - 99 (mg/dL)  PROTIME-INR     Status: Abnormal   Collection Time   10/27/11  3:45 AM      Component Value Range    Prothrombin Time 24.5 (*) 11.6 - 15.2 (seconds)   INR 2.16 (*) 0.00 - 1.49   CARDIAC PANEL(CRET KIN+CKTOT+MB+TROPI)     Status: Normal   Collection Time   10/27/11  3:45 AM      Component Value Range   Total CK 32  7 - 232 (U/L)   CK, MB 2.7  0.3 - 4.0 (ng/mL)   Troponin I <0.30  <0.30 (ng/mL)   Relative Index RELATIVE INDEX IS INVALID  0.0 - 2.5   CBC     Status: Abnormal   Collection Time   10/27/11  3:45 AM      Component Value Range   WBC 6.6  4.0 - 10.5 (K/uL)   RBC 4.16 (*) 4.22 - 5.81 (MIL/uL)   Hemoglobin 12.1 (*) 13.0 - 17.0 (g/dL)   HCT 16.1 (*) 09.6 - 52.0 (%)   MCV 91.1  78.0 - 100.0 (fL)   MCH 29.1  26.0 - 34.0 (pg)   MCHC 31.9  30.0 - 36.0 (g/dL)   RDW 04.5  40.9 - 81.1 (%)   Platelets 233  150 - 400 (K/uL)  COMPREHENSIVE METABOLIC PANEL     Status: Abnormal   Collection Time   10/27/11  3:45 AM      Component Value Range   Sodium 139  135 - 145 (mEq/L)   Potassium 4.4  3.5 - 5.1 (mEq/L)   Chloride 102  96 - 112 (mEq/L)   CO2 30  19 - 32 (mEq/L)   Glucose, Bld 194 (*) 70 - 99 (mg/dL)   BUN 26 (*) 6 - 23 (mg/dL)   Creatinine, Ser 9.14  0.50 - 1.35 (mg/dL)   Calcium 8.8  8.4 - 78.2 (mg/dL)   Total Protein 6.9  6.0 - 8.3 (g/dL)   Albumin 3.2 (*) 3.5 - 5.2 (g/dL)   AST 13  0 - 37 (U/L)   ALT 10  0 - 53 (U/L)   Alkaline Phosphatase 55  39 - 117 (U/L)   Total Bilirubin 0.8  0.3 - 1.2 (mg/dL)   GFR calc non Af Amer 82 (*) >90 (mL/min)   GFR calc Af Amer >90  >90 (mL/min)  CARDIAC PANEL(CRET KIN+CKTOT+MB+TROPI)     Status: Normal   Collection Time   10/27/11 12:16 PM      Component Value Range   Total CK 37  7 - 232 (U/L)   CK, MB 2.5  0.3 - 4.0 (ng/mL)   Troponin I <0.30  <0.30 (ng/mL)   Relative Index RELATIVE INDEX IS INVALID  0.0 - 2.5      Micro: No  results found for this or any previous visit (from the past 240 hour(s)).  Studies/Results: Dg Chest 2 View  10/26/2011  *RADIOLOGY REPORT*  Clinical Data: Shortness breath with dizziness.  History of  diabetes.  CHEST - 2 VIEW  Comparison: 10/26/2011 and reports from remote examinations prior to 2002.  Findings: The heart size and mediastinal contours are stable.  Low lung volumes and diffuse interstitial prominence are similar to the available prior examination from earlier today. No focal airspace disease or significant pleural effusion is identified. Cholecystectomy clips and bilateral glenohumeral degenerative changes are noted.  IMPRESSION: Suspected chronic interstitial lung disease.  No definite acute superimposed process.  Original Report Authenticated By: Gerrianne Scale, M.D.   Ct Angio Chest W/cm &/or Wo Cm  10/26/2011  *RADIOLOGY REPORT*  Clinical Data: History of CHF, now with low oxygen saturation shortness of breath, evaluate for pulmonary embolism  CT ANGIOGRAPHY CHEST  Technique:  Multidetector CT imaging of the chest using the standard protocol during bolus administration of intravenous contrast. Multiplanar reconstructed images including MIPs were obtained and reviewed to evaluate the vascular anatomy.  Contrast: OMNIPAQUE IOHEXOL 300 MG/ML  SOLN  Comparison: Chest radiograph - 10/26/2011  Findings:  There is adequate opacification of the pulmonary vasculature with the main pulmonary artery measuring 250 HU.  No discrete filling defects within the pulmonary vasculature to suggest acute pulmonary embolism.  Caliber of the main pulmonary artery is enlarged measuring 3.5 cm in diameter.  Cardiomegaly with apparent enlargement of the right atrium.  No pericardial effusion.  Coronary artery calcifications.  Normal caliber of the thoracic aorta.  Scattered atherosclerotic calcifications within a normal caliber thoracic aorta.  Normal configuration of the aortic arch.  No definite periaortic stranding.  Nonvascular findings:  Small bilateral pleural effusions, right greater than left. Bibasilar dependent opacities, right greater than left. Calcifications within the bilateral bronchi.  Mild  air trapping is suspected.  No pneumothorax.  Nonspecific borderline mediastinal adenopathy with index prevascular lymph node measuring 1 cm in short axis diameter (image 35, series four) and index precarinal lymph node measuring 1.2 cm in short axis diameter (image 38).  No definite hilar or axillary lymphadenopathy.  Early arterial phase evaluation of the upper abdomen demonstrates a large ventral abdominal wall containing a portion of nondilated loop of transverse colon, incompletely imaged.  Post cholecystectomy.  Extensive vascular calcifications within the splenic artery.  No acute or aggressive osseous abnormalities.  Thoracic spine degenerative change  IMPRESSION:  1.  Negative for pulmonary embolism.  2.  Overall findings compatible with pulmonary edema with small bilateral effusions and bibasilar opacities, right greater than left, atelectasis versus infiltrate.  3.  Apparent enlargement of the right atrium and main pulmonary artery, nonspecific though may be seen in the setting of pulmonary arterial hypertension.  Further evaluation with cardiac echo may be performed as clinically indicated.  4. Nonspecific mediastinal lymphadenopathy, possibly reactive in etiology.  Original Report Authenticated By: Waynard Reeds, M.D.    Medications:  Scheduled Meds:   . sodium chloride   Intravenous Once  . albuterol  2.5 mg Nebulization TID  . aspirin  81 mg Oral Daily  . carvedilol  25 mg Oral BID WC  . digoxin  250 mcg Oral Daily  . ferrous sulfate  325 mg Oral QPC breakfast  . furosemide  40 mg Intravenous Once  . furosemide  40 mg Intravenous Q12H  . furosemide  40 mg Intravenous Once  . glipiZIDE  10 mg  Oral BID AC  . ipratropium  0.5 mg Nebulization TID  . isosorbide mononitrate  30 mg Oral Daily  . methylPREDNISolone (SOLU-MEDROL) injection  60 mg Intravenous Q12H  . nitroGLYCERIN  0.5 inch Topical Q8H  . silodosin  8 mg Oral Q breakfast  . simvastatin  20 mg Oral QPM  . sodium chloride   3 mL Intravenous Q12H  . warfarin  7.5 mg Oral ONCE-1800  . Warfarin - Pharmacist Dosing Inpatient   Does not apply q1800  . DISCONTD: albuterol  2.5 mg Nebulization Q4H  . DISCONTD: enoxaparin  40 mg Subcutaneous Q24H  . DISCONTD: ipratropium  0.5 mg Nebulization Q6H  . DISCONTD: ipratropium  0.5 mg Nebulization Q4H  . DISCONTD: ipratropium  0.5 mg Nebulization Q6H  . DISCONTD: warfarin  10 mg Oral Custom  . DISCONTD: warfarin  5 mg Oral See admin instructions  . DISCONTD: warfarin  7.5 mg Oral Custom  . DISCONTD: Warfarin - Physician Dosing Inpatient   Does not apply q1800   Continuous Infusions:  PRN Meds:.sodium chloride, acetaminophen, acetaminophen, albuterol, iohexol, ondansetron (ZOFRAN) IV, ondansetron, sodium chloride, DISCONTD: albuterol   Assessment: Principal Problem:  *Congestive heart failure Active Problems:  Normocytic anemia  Shortness of breath  Hypertension  Dyslipidemia  Atrial fibrillation   Plan: #1 shortness of breath the patient has been ruled out for PE, although he has been therapeutic on his INR. The patient's CT scan is consistent with congestive heart failure, 2-D echo still pending at this time, negative cardiac enzymes, TSH of 1.544 , diuresing with IV Lasix, monitor renal function closely, hold off on ACE inhibitor until the echo is resulted.   #2 Hypertension the patient is on Coreg which will be continued   #3 atrial fibrillation the patient is rate controlled at this time continue Coreg continue digoxin, continue anticoagulation per pharmacy   #4 diabetes the patient will be placed on sliding scale insulin will continue glipizide and hold metformin , hemoglobin A1c of 7.7  Disposition DC home in the morning      LOS: 1 day   [facility] 10/27/2011, 1:49 PM

## 2011-10-27 NOTE — Progress Notes (Signed)
Pt had a 2.61 second pause with a rhythm of A-fib and a HR of 49. Pt was sleeping at the time and was asymptomatic with no complaints. Pt has had pauses before but the greatest was 2.5 per monitor tech. BP at this time was 105/59. MD made aware and no new orders received at this time. Will continue to monitor pt.   2:20 PM 10/27/2011 Arta Bruce [facility]

## 2011-10-27 NOTE — Progress Notes (Signed)
Patient had 2.61 second pause. Patients heart rate dropped down to 31 but came back up to 40 in a. Fib. bp is stable. 122/54. Patient has no symptoms. MD was notified of situation. MD plans to discontinue coreg, digoxin, and nitro for now.  Will continue to monitor patient.

## 2011-10-27 NOTE — Progress Notes (Signed)
  Echocardiogram 2D Echocardiogram has been performed.  Troy Cook 10/27/2011, 9:07 AM

## 2011-10-28 DIAGNOSIS — R0602 Shortness of breath: Secondary | ICD-10-CM

## 2011-10-28 DIAGNOSIS — E118 Type 2 diabetes mellitus with unspecified complications: Secondary | ICD-10-CM

## 2011-10-28 DIAGNOSIS — I509 Heart failure, unspecified: Secondary | ICD-10-CM

## 2011-10-28 DIAGNOSIS — E1165 Type 2 diabetes mellitus with hyperglycemia: Secondary | ICD-10-CM

## 2011-10-28 LAB — PROTIME-INR
INR: 2.03 — ABNORMAL HIGH (ref 0.00–1.49)
Prothrombin Time: 23.3 seconds — ABNORMAL HIGH (ref 11.6–15.2)

## 2011-10-28 LAB — GLUCOSE, CAPILLARY

## 2011-10-28 MED ORDER — INSULIN ASPART 100 UNIT/ML ~~LOC~~ SOLN
0.0000 [IU] | Freq: Three times a day (TID) | SUBCUTANEOUS | Status: DC
Start: 2011-10-28 — End: 2011-10-30
  Administered 2011-10-28: 8 [IU] via SUBCUTANEOUS
  Administered 2011-10-29: 3 [IU] via SUBCUTANEOUS
  Administered 2011-10-29: 5 [IU] via SUBCUTANEOUS
  Administered 2011-10-29: 8 [IU] via SUBCUTANEOUS

## 2011-10-28 MED ORDER — WARFARIN SODIUM 7.5 MG PO TABS
7.5000 mg | ORAL_TABLET | Freq: Once | ORAL | Status: AC
  Administered 2011-10-28: 7.5 mg via ORAL
  Filled 2011-10-28: qty 1

## 2011-10-28 NOTE — Progress Notes (Signed)
ANTICOAGULATION CONSULT NOTE - Follow up Consult  Pharmacy Consult for Warfarin Indication: atrial fibrillation  No Known Allergies  Patient Measurements: Height: 6' (182.9 cm) Weight: 256 lb 13.4 oz (116.5 kg) IBW/kg (Calculated) : Troy.6   Vital Signs: Temp: 97.9 F (36.6 C) (05/15 0550) Temp src: Oral (05/15 0550) BP: 162/72 mmHg (05/15 0550) Pulse Rate: 61  (05/15 0550)  Labs:  Basename 10/28/11 0510 10/27/11 1216 10/27/11 0345 10/26/11 2054 10/26/11 1839 10/26/11 1440  HGB -- -- 12.1* -- -- 12.1*  HCT -- -- 37.9* -- -- 37.8*  PLT -- -- 233 -- -- 248  APTT -- -- -- -- -- --  LABPROT 23.3* -- 24.5* -- 25.2* --  INR 2.03* -- 2.16* -- 2.24* --  HEPARINUNFRC -- -- -- -- -- --  CREATININE -- -- 0.84 -- -- 0.59  CKTOTAL -- 37 32 38 -- --  CKMB -- 2.5 2.7 3.0 -- --  TROPONINI -- <0.30 <0.30 <0.30 -- --    Estimated Creatinine Clearance: 97.1 ml/min (by C-G formula based on Cr of 0.84).   Medical History: Past Medical History  Diagnosis Date  . Atrial fibrillation   . Diabetes mellitus   . Hypertension   . Coronary artery disease   . Hyperlipemia     Assessment:  Troy Cook on chronic coumadin for atrial fibrillation  Home warfarin dose: 7.5 mg daily except takes 10 mg on Mondays/Fridays.    INR therapeutic on admission.  No DVT or PE noted.  INR remains therapeutic  CBC wnl;  No bleeding/complications reported  Goal of Therapy:  INR 2-3   Plan:   Warfarin 7.5mg  PO today  Daily INR   Lynann Beaver PharmD, BCPS Pager [phone] 10/28/2011 12:55 PM

## 2011-10-28 NOTE — Consult Note (Signed)
Admit date: 10/26/2011 Referring Physician  Dr. Lenise Arena Primary Physician  Dr. Barbaraann Barthel Primary Cardiologist  Eldridge Dace Reason for Consultation  CHF  HPI: 78 y/o with known ischemic cardiomyopathy.  He has  Had swelling in his LE for several months.  When I saw him in January, we discussed Lasix for edema but he declines as he felt well.  Over the past 10 days, he thinks that the swelling was getting worse.  He has also developed SHOB, particularly with walking.  He had trouble lying flat.  He went to his PMD who sent him to the ER.  No chest pain.  He has diuresed well and feels better.  He has a CTO of the LAD and a failed angioplasty in 2005.    He has also had some bradycardia while on tele.  He denies any syncope or lightheadedness.       PMH:   Past Medical History  Diagnosis Date  . Atrial fibrillation   . Diabetes mellitus   . Hypertension   . Coronary artery disease   . Hyperlipemia      PSH:   Past Surgical History  Procedure Date  . Cholecystectomy     Allergies:  Review of patient's allergies indicates no known allergies. Prior to Admit Meds:   Prescriptions prior to admission  Medication Sig Dispense Refill  . aspirin 81 MG tablet Take 81 mg by mouth daily.      . carvedilol (COREG) 25 MG tablet Take 25 mg by mouth 2 (two) times daily.      . digoxin (LANOXIN) 0.25 MG tablet Take 250 mcg by mouth daily.      . ferrous sulfate 325 (65 FE) MG tablet Take 325 mg by mouth daily with breakfast.      . glipiZIDE (GLUCOTROL XL) 10 MG 24 hr tablet Take 10 mg by mouth 2 (two) times daily.      . isosorbide mononitrate (IMDUR) 30 MG 24 hr tablet Take 30 mg by mouth daily.      . metFORMIN (GLUCOPHAGE) 500 MG tablet Take 1,000 mg by mouth 2 (two) times daily.      . ramipril (ALTACE) 10 MG capsule Take 10 mg by mouth 2 (two) times daily.      . silodosin (RAPAFLO) 8 MG CAPS capsule Take 8 mg by mouth daily with breakfast.      . simvastatin (ZOCOR) 20 MG tablet Take 20 mg by  mouth every evening.      . warfarin (COUMADIN) 5 MG tablet Take 5 mg by mouth See admin instructions. Pt takes 1 and 1/2 tablets 7.5 mg daily except, takes 10 mg 2 tablets on Monday and Friday       Fam HX:   History reviewed. No pertinent family history. Social HX:    History   Social History  . Marital Status: Married    Spouse Name: N/A    Number of Children: N/A  . Years of Education: N/A   Occupational History  . Not on file.   Social History Main Topics  . Smoking status: Never Smoker   . Smokeless tobacco: Never Used  . Alcohol Use: No  . Drug Use:   . Sexually Active: No   Other Topics Concern  . Not on file   Social History Narrative  . No narrative on file     ROS:  All 11 ROS were addressed and are negative except what is stated in the HPI  Physical Exam: Blood  pressure 147/70, pulse 67, temperature 98 F (36.7 C), temperature source Other (Comment), resp. rate 20, height 6' (1.829 m), weight 116.5 kg (256 lb 13.4 oz), SpO2 95.00%.    General: Well developed, well nourished, in no acute distress Head:   Normal cephalic and atramatic  Lungs: left sided crackles Heart:  Irregular, bradycardia Abdomen:  abdomen soft and non-tender Msk:  Back normal, . Normal strength and tone for age. Extremities:  1+ edema Neuro: Alert and oriented X 3. Psych:  Good affect, responds appropriately    Labs:   Lab Results  Component Value Date   WBC 6.6 10/27/2011   HGB 12.1* 10/27/2011   HCT 37.9* 10/27/2011   MCV 91.1 10/27/2011   PLT 233 10/27/2011    Lab 10/27/11 0345  NA 139  K 4.4  CL 102  CO2 30  BUN 26*  CREATININE 0.84  CALCIUM 8.8  PROT 6.9  BILITOT 0.8  ALKPHOS 55  ALT 10  AST 13  GLUCOSE 194*   No results found for this basename: PTT   Lab Results  Component Value Date   INR 2.03* 10/28/2011   INR 2.16* 10/27/2011   INR 2.24* 10/26/2011   Lab Results  Component Value Date   CKTOTAL 37 10/27/2011   CKMB 2.5 10/27/2011   TROPONINI <0.30  10/27/2011     No results found for this basename: CHOL   No results found for this basename: HDL   No results found for this basename: LDLCALC   No results found for this basename: TRIG   No results found for this basename: CHOLHDL   No results found for this basename: LDLDIRECT      Radiology:  No results found.  EKG:  Afib, slow ventricular response; PVCs on tele  ASSESSMENT: Acute on chronic systolic heart failure. Known LVEF 30-35% from 2011 echo in office, and likely since at least 2005.  PLAN:  Continue diuresis.  Beta blocker, digoxin limited by slow heart rate.  He was taking high dose coreg at home along with digoxin.  Now that he has had an exacerbation of CHF, he is willing to consider AICD placement.  Will obtain EP consult to see if he would benefit.  Regardless, his bradycardia will limit heart failure therapy.  Continue ACE-I for now.  Continue statin given CAD.  Corky Crafts., MD  10/28/2011  5:50 PM

## 2011-10-28 NOTE — Progress Notes (Signed)
Subjective: Lying in bed visiting with wife. Denies pain/discomfort. Reports feeling better since "whatever you people did".   Objective: Vital signs Filed Vitals:   10/27/11 2110 10/28/11 0550 10/28/11 0829 10/28/11 1322  BP: 131/62 162/72  147/70  Pulse: 54 61  67  Temp: 97.8 F (36.6 C) 97.9 F (36.6 C)  98 F (36.7 C)  TempSrc: Oral Oral  Other (Comment)  Resp: 20 20  20   Height:      Weight:  116.5 kg (256 lb 13.4 oz)    SpO2: 98% 96% 97% 94%   Weight change: -1.7 kg (-3 lb 12 oz) Last BM Date: 10/27/11  Intake/Output from previous day: 05/14 0701 - 05/15 0700 In: 800 [P.O.:800] Out: 1425 [Urine:1425] Total I/O In: 240 [P.O.:240] Out: 275 [Urine:275]   Physical Exam: General: Alert, awake, oriented x3, in no acute distress. HEENT: No bruits, no goiter. Mucus membranes mouth moist/pink. Heart: regular rate and rhythm, without murmurs, rubs, gallops. Lungs:Normal effort. Breath sounds somewhat coarse. No wheeze. Abdomen:  Obese, Soft, nontender, nondistended, positive bowel sounds. Extremities: No clubbing cyanosis or edema with positive pedal pulses. 2+LEE Neuro: Grossly intact, nonfocal.    Lab Results: Basic Metabolic Panel:  Basename 10/27/11 0345 10/26/11 1440  NA 139 137  K 4.4 4.5  CL 102 101  CO2 30 29  GLUCOSE 194* 124*  BUN 26* 21  CREATININE 0.84 0.59  CALCIUM 8.8 8.7  MG -- --  PHOS -- --   Liver Function Tests:  Basename 10/27/11 0345 10/26/11 2047  AST 13 14  ALT 10 10  ALKPHOS 55 59  BILITOT 0.8 0.9  PROT 6.9 7.5  ALBUMIN 3.2* 3.5   No results found for this basename: LIPASE:2,AMYLASE:2 in the last 72 hours No results found for this basename: AMMONIA:2 in the last 72 hours CBC:  Basename 10/27/11 0345 10/26/11 1440  WBC 6.6 9.5  NEUTROABS -- 7.4  HGB 12.1* 12.1*  HCT 37.9* 37.8*  MCV 91.1 91.1  PLT 233 248   Cardiac Enzymes:  Basename 10/27/11 1216 10/27/11 0345 10/26/11 2054  CKTOTAL 37 32 38  CKMB 2.5 2.7 3.0    CKMBINDEX -- -- --  TROPONINI <0.30 <0.30 <0.30   BNP:  Basename 10/26/11 1440  PROBNP 2287.0*   D-Dimer:  Basename 10/26/11 1440  DDIMER 0.96*   CBG:  Basename 10/26/11 2205  GLUCAP 186*   Hemoglobin A1C:  Basename 10/26/11 2047  HGBA1C 7.7*   Fasting Lipid Panel: No results found for this basename: CHOL,HDL,LDLCALC,TRIG,CHOLHDL,LDLDIRECT in the last 72 hours Thyroid Function Tests:  [facility] 10/26/11 2047  TSH 1.544  T4TOTAL --  FREET4 --  T3FREE --  THYROIDAB --   Anemia Panel: No results found for this basename: VITAMINB12,FOLATE,FERRITIN,TIBC,IRON,RETICCTPCT in the last 72 hours Coagulation:  Basename 10/28/11 0510 10/27/11 0345  LABPROT 23.3* 24.5*  INR 2.03* 2.16*   Urine Drug Screen: Drugs of Abuse  No results found for this basename: labopia, cocainscrnur, labbenz, amphetmu, thcu, labbarb    Alcohol Level: No results found for this basename: ETH:2 in the last 72 hours Urinalysis: No results found for this basename: COLORURINE:2,APPERANCEUR:2,LABSPEC:2,PHURINE:2,GLUCOSEU:2,HGBUR:2,BILIRUBINUR:2,KETONESUR:2,PROTEINUR:2,UROBILINOGEN:2,NITRITE:2,LEUKOCYTESUR:2 in the last 72 hours Misc. Labs:  No results found for this or any previous visit (from the past 240 hour(s)).  Studies/Results: Dg Chest 2 View  10/26/2011  *RADIOLOGY REPORT*  Clinical Data: Shortness breath with dizziness.  History of diabetes.  CHEST - 2 VIEW  Comparison: 10/26/2011 and reports from remote examinations prior to 2002.  Findings: The heart size  and mediastinal contours are stable.  Low lung volumes and diffuse interstitial prominence are similar to the available prior examination from earlier today. No focal airspace disease or significant pleural effusion is identified. Cholecystectomy clips and bilateral glenohumeral degenerative changes are noted.  IMPRESSION: Suspected chronic interstitial lung disease.  No definite acute superimposed process.  Original Report Authenticated  By: Gerrianne Scale, M.D.   Ct Angio Chest W/cm &/or Wo Cm  10/26/2011  *RADIOLOGY REPORT*  Clinical Data: History of CHF, now with low oxygen saturation shortness of breath, evaluate for pulmonary embolism  CT ANGIOGRAPHY CHEST  Technique:  Multidetector CT imaging of the chest using the standard protocol during bolus administration of intravenous contrast. Multiplanar reconstructed images including MIPs were obtained and reviewed to evaluate the vascular anatomy.  Contrast: OMNIPAQUE IOHEXOL 300 MG/ML  SOLN  Comparison: Chest radiograph - 10/26/2011  Findings:  There is adequate opacification of the pulmonary vasculature with the main pulmonary artery measuring 250 HU.  No discrete filling defects within the pulmonary vasculature to suggest acute pulmonary embolism.  Caliber of the main pulmonary artery is enlarged measuring 3.5 cm in diameter.  Cardiomegaly with apparent enlargement of the right atrium.  No pericardial effusion.  Coronary artery calcifications.  Normal caliber of the thoracic aorta.  Scattered atherosclerotic calcifications within a normal caliber thoracic aorta.  Normal configuration of the aortic arch.  No definite periaortic stranding.  Nonvascular findings:  Small bilateral pleural effusions, right greater than left. Bibasilar dependent opacities, right greater than left. Calcifications within the bilateral bronchi.  Mild air trapping is suspected.  No pneumothorax.  Nonspecific borderline mediastinal adenopathy with index prevascular lymph node measuring 1 cm in short axis diameter (image 35, series four) and index precarinal lymph node measuring 1.2 cm in short axis diameter (image 38).  No definite hilar or axillary lymphadenopathy.  Early arterial phase evaluation of the upper abdomen demonstrates a large ventral abdominal wall containing a portion of nondilated loop of transverse colon, incompletely imaged.  Post cholecystectomy.  Extensive vascular calcifications within the  splenic artery.  No acute or aggressive osseous abnormalities.  Thoracic spine degenerative change  IMPRESSION:  1.  Negative for pulmonary embolism.  2.  Overall findings compatible with pulmonary edema with small bilateral effusions and bibasilar opacities, right greater than left, atelectasis versus infiltrate.  3.  Apparent enlargement of the right atrium and main pulmonary artery, nonspecific though may be seen in the setting of pulmonary arterial hypertension.  Further evaluation with cardiac echo may be performed as clinically indicated.  4. Nonspecific mediastinal lymphadenopathy, possibly reactive in etiology.  Original Report Authenticated By: Waynard Reeds, M.D.    Medications: Scheduled Meds:   . albuterol  2.5 mg Nebulization TID  . aspirin  81 mg Oral Daily  . ferrous sulfate  325 mg Oral QPC breakfast  . furosemide  40 mg Intravenous Q12H  . glipiZIDE  10 mg Oral BID AC  . ipratropium  0.5 mg Nebulization TID  . isosorbide mononitrate  30 mg Oral Daily  . methylPREDNISolone (SOLU-MEDROL) injection  60 mg Intravenous Q12H  . silodosin  8 mg Oral Q breakfast  . simvastatin  20 mg Oral QPM  . sodium chloride  3 mL Intravenous Q12H  . warfarin  7.5 mg Oral ONCE-1800  . warfarin  7.5 mg Oral ONCE-1800  . Warfarin - Pharmacist Dosing Inpatient   Does not apply q1800  . DISCONTD: carvedilol  25 mg Oral BID WC  . DISCONTD:  digoxin  250 mcg Oral Daily  . DISCONTD: nitroGLYCERIN  0.5 inch Topical Q8H   Continuous Infusions:  PRN Meds:.sodium chloride, acetaminophen, acetaminophen, albuterol, ondansetron (ZOFRAN) IV, ondansetron, sodium chloride  Assessment/Plan:  Principal Problem:  *Congestive heart failure Active Problems:  Normocytic anemia  Shortness of breath  Hypertension  Dyslipidemia  Atrial fibrillation #1 shortness of breath. Much improved. the patient has been ruled out for PE, although he has been therapeutic on his INR. The patient's CT scan is consistent with  congestive heart failure, 2-D echo yields EF 30-35% with diffuse hypokinesis. Negative cardiac enzymes, TSH of 1.544. Will continue diuresing with IV Lasix, monitor renal function closely. ACE was on hold, consider resuming as BP creeping up.  #2 Hypertension. Coreg discontinued 5/14 afternoon for HR 40. BP stable but trending up. Monitor closely and consider resuming ACE.  #3 atrial fibrillation: HR trended from 60's to 40  On 5/14. Coreg and dig discontinued. HR in 60's today. Will request cardiology consult per family. anticoagulation per pharmacy  #4 diabetes the patient will be placed on sliding scale insulin will continue glipizide and hold metformin , hemoglobin A1c of 7.7      LOS: 2 days   [facility] M 10/28/2011, 2:00 PM

## 2011-10-28 NOTE — Plan of Care (Signed)
Problem: Consults Goal: Skin Care Protocol Initiated - if indicated If consults are not indicated, leave blank or document N/A  Outcome: Progressing Has a skin tear that was bleeding some to top of gluteal fold area of buttocks. Pt states that he has had a problem with this for years that he had a cyst removed from that area yrs ago, & often times it breaks open & bleeds some. Using Aleovesta cream to the area at present to promote healing & comfort to the pt. He requested that no dsg be appied at this time if possible, because it makes it very uncomfortable for him if a dsg is applied to that area. Will con't to monitor daily & q shift.

## 2011-10-28 NOTE — Progress Notes (Signed)
Pt was seen and examined at bedside. I have reviewed labs and vitals which are stable and pt is hemodynamically stable. I agree with the above's assessment and plan.  Continue diuresis and follow up on cardiology recommendations.  Debbora Presto  [facility], pager #: [phone] Main office number: [phone]

## 2011-10-29 DIAGNOSIS — E1165 Type 2 diabetes mellitus with hyperglycemia: Secondary | ICD-10-CM

## 2011-10-29 DIAGNOSIS — E118 Type 2 diabetes mellitus with unspecified complications: Secondary | ICD-10-CM

## 2011-10-29 DIAGNOSIS — R0602 Shortness of breath: Secondary | ICD-10-CM

## 2011-10-29 DIAGNOSIS — I509 Heart failure, unspecified: Secondary | ICD-10-CM

## 2011-10-29 LAB — GLUCOSE, CAPILLARY
Glucose-Capillary: 217 mg/dL — ABNORMAL HIGH (ref 70–99)
Glucose-Capillary: 255 mg/dL — ABNORMAL HIGH (ref 70–99)
Glucose-Capillary: 193 mg/dL — ABNORMAL HIGH (ref 70–99)

## 2011-10-29 LAB — CBC
Hemoglobin: 12.2 g/dL — ABNORMAL LOW (ref 13.0–17.0)
MCH: 29 pg (ref 26.0–34.0)
MCHC: 32.2 g/dL (ref 30.0–36.0)
RDW: 14.2 % (ref 11.5–15.5)

## 2011-10-29 LAB — BASIC METABOLIC PANEL
Calcium: 8.9 mg/dL (ref 8.4–10.5)
GFR calc Af Amer: >90 mL/min (ref >90)
GFR calc non Af Amer: 79 mL/min — ABNORMAL LOW (ref >90)
Glucose, Bld: 244 mg/dL — ABNORMAL HIGH (ref 70–99)
Potassium: 3.8 mEq/L (ref 3.5–5.1)
Sodium: 140 mEq/L (ref 135–145)

## 2011-10-29 LAB — PROTIME-INR
INR: 2.32 — ABNORMAL HIGH (ref 0.00–1.49)
Prothrombin Time: 25.9 seconds — ABNORMAL HIGH (ref 11.6–15.2)

## 2011-10-29 MED ORDER — WARFARIN SODIUM 7.5 MG PO TABS
7.5000 mg | ORAL_TABLET | Freq: Once | ORAL | Status: AC
  Administered 2011-10-29: 7.5 mg via ORAL
  Filled 2011-10-29: qty 1

## 2011-10-29 MED ORDER — INSULIN GLARGINE 100 UNIT/ML ~~LOC~~ SOLN
15.0000 [IU] | Freq: Every day | SUBCUTANEOUS | Status: DC
  Administered 2011-10-29: 15 [IU] via SUBCUTANEOUS

## 2011-10-29 MED ORDER — RAMIPRIL 10 MG PO CAPS
10.0000 mg | ORAL_CAPSULE | Freq: Two times a day (BID) | ORAL | Status: DC
  Administered 2011-10-29: 10 mg via ORAL
  Filled 2011-10-29 (×2): qty 1

## 2011-10-29 MED ORDER — FUROSEMIDE 40 MG PO TABS
60.0000 mg | ORAL_TABLET | Freq: Every day | ORAL | Status: DC
  Administered 2011-10-29: 60 mg via ORAL
  Filled 2011-10-29: qty 1

## 2011-10-29 MED ORDER — FUROSEMIDE 20 MG PO TABS: 60.0000 mg | ORAL_TABLET | Freq: Every day | ORAL | Status: DC

## 2011-10-29 NOTE — Plan of Care (Signed)
Problem: Phase III Progression Outcomes Goal: Activity at appropriate level-compared to baseline (UP IN CHAIR FOR HEMODIALYSIS)  Outcome: Progressing During PT eval: at rest-95% RA, 57 bpm. During ambulation-83% RA, 87 bpm.

## 2011-10-29 NOTE — Progress Notes (Signed)
ANTICOAGULATION CONSULT NOTE - Follow up Consult  Pharmacy Consult for Warfarin Indication: atrial fibrillation  No Known Allergies  Patient Measurements: Height: 6' (182.9 cm) Weight: 250 lb 6.4 oz (113.581 kg) IBW/kg (Calculated) : 77.6   Vital Signs: Temp: 97.9 F (36.6 C) (05/16 0543) Temp src: Oral (05/16 0543) BP: 172/20 mmHg (05/16 0543) Pulse Rate: 80  (05/16 0543)  Labs:  Basename 10/29/11 0520 10/28/11 0510 10/27/11 1216 10/27/11 0345 10/26/11 2054 10/26/11 1440  HGB 12.2* -- -- 12.1* -- --  HCT 37.9* -- -- 37.9* -- 37.8*  PLT 254 -- -- 233 -- 248  APTT -- -- -- -- -- --  LABPROT 25.9* 23.3* -- 24.5* -- --  INR 2.32* 2.03* -- 2.16* -- --  HEPARINUNFRC -- -- -- -- -- --  CREATININE 0.93 -- -- 0.84 -- 0.59  CKTOTAL -- -- 37 32 38 --  CKMB -- -- 2.5 2.7 3.0 --  TROPONINI -- -- <0.30 <0.30 <0.30 --    Estimated Creatinine Clearance: 86.6 ml/min (by C-G formula based on Cr of 0.93).   Medical History: Past Medical History  Diagnosis Date  . Atrial fibrillation   . Diabetes mellitus   . Hypertension   . Coronary artery disease   . Hyperlipemia     Assessment:  79 YOM on chronic coumadin for atrial fibrillation  Home warfarin dose: 7.5 mg daily except takes 10 mg on Mondays/Fridays.    INR therapeutic on admission.  No DVT or PE noted.  INR remains therapeutic  CBC wnl;  No bleeding/complications reported  Goal of Therapy:  INR 2-3   Plan:   Warfarin 7.5mg  PO today  F/U Daily INR   Lynann Beaver PharmD, BCPS Pager [phone] 10/29/2011 11:24 AM

## 2011-10-29 NOTE — Evaluation (Signed)
Physical Therapy Evaluation Patient Details Name: Troy Cook MRN: [id number] DOB: 01-16-1932 Today's Date: 10/29/2011 Time: 4098-1191 PT Time Calculation (min): 32 min  PT Assessment / Plan / Recommendation Clinical Impression  Pt diagnosed with CHF. Pt demosntrated general weakness and decreased activity tolerance during evaluation. O2 sats dropped to 83% on RA during ambulation. Feel pt should be able to return home with wife with HHPT.     PT Assessment  Patient needs continued PT services    Follow Up Recommendations  Home health PT    Barriers to Discharge Decreased caregiver support wife in CAM boot. Unable to assist physically.     lEquipment Recommendations  3 in 1 bedside comode;Rolling walker with 5" wheels    Recommendations for Other Services OT consult   Frequency Min 3X/week    Precautions / Restrictions Precautions Precautions: Fall Restrictions Weight Bearing Restrictions: No   Pertinent Vitals/Pain At rest, 95% RA, 57 bpm. During activity, 83% RA, 87 bpm      Mobility  Bed Mobility Bed Mobility: Supine to Sit;Sit to Supine Supine to Sit: HOB elevated;6: Modified independent (Device/Increase time) Sit to Supine: 6: Modified independent (Device/Increase time) Transfers Transfers: Sit to Stand;Stand to Sit Sit to Stand: 5: Supervision;From elevated surface Stand to Sit: 5: Supervision Details for Transfer Assistance: VCs safety, hand placement.  Ambulation/Gait Ambulation/Gait Assistance: 4: Min guard Ambulation Distance (Feet): 60 Feet Assistive device: Rolling walker Ambulation/Gait Assistance Details: Slow gait speed. Pt declined to ambulate further due to concern for not making it to bathroom in time. Dyspnea 2/4.  Gait Pattern: Trunk flexed;Decreased step length - left;Decreased step length - right;Decreased stride length    Exercises     PT Diagnosis: Difficulty walking;Generalized weakness  PT Problem List: Decreased strength;Decreased  activity tolerance;Decreased knowledge of use of DME PT Treatment Interventions: DME instruction;Gait training;Functional mobility training;Therapeutic activities;Patient/family education   PT Goals Acute Rehab PT Goals PT Goal Formulation: With patient/family Time For Goal Achievement: 11/05/11 Potential to Achieve Goals: Good Pt will go Supine/Side to Sit: with modified independence PT Goal: Supine/Side to Sit - Progress: Goal set today Pt will go Sit to Supine/Side: with modified independence PT Goal: Sit to Supine/Side - Progress: Goal set today Pt will go Sit to Stand: with modified independence;from elevated surface PT Goal: Sit to Stand - Progress: Goal set today Pt will Ambulate: 51 - 150 feet;with modified independence;with least restrictive assistive device PT Goal: Ambulate - Progress: Goal set today  Visit Information  Last PT Received On: 10/29/11 Assistance Needed: +1    Subjective Data  Subjective: "I can't walk too far away from the bathroom because of this lasix they're giving me" Patient Stated Goal: Home   Prior Functioning  Home Living Lives With: Spouse Type of Home: House Home Access: Ramped entrance Home Layout: One level;Laundry or work area in [employer] Equipment: Straight cane Prior Function Level of Independence: Independent with assistive device(s) Able to Take Stairs?: No Driving: Yes Communication Communication: No difficulties    Cognition  Overall Cognitive Status: Appears within functional limits for tasks assessed/performed Arousal/Alertness: Awake/alert Orientation Level: Appears intact for tasks assessed Behavior During Session: [facility] for tasks performed    Extremity/Trunk Assessment Right Lower Extremity Assessment RLE ROM/Strength/Tone: [facility] for tasks assessed (noted edema) RLE Coordination: WFL - gross motor Left Lower Extremity Assessment LLE ROM/Strength/Tone: WFL for tasks assessed (noted edema) LLE Coordination:  WFL - gross motor Trunk Assessment Trunk Assessment: Normal   Balance    End of  Session PT - End of Session Equipment Utilized During Treatment: Gait belt Activity Tolerance: Patient tolerated treatment well Patient left: in bed;with call bell/phone within reach   Rebeca Alert [facility] 10/29/2011, 4:28 PM [phone]

## 2011-10-29 NOTE — Discharge Instructions (Signed)
Heart Failure Heart failure (HF) is a condition in which the heart has trouble pumping blood. This means your heart does not pump blood efficiently for your body to work well. In some cases of HF, fluid may back up into your lungs or you may have swelling (edema) in your lower legs. HF is a long-term (chronic) condition. It is important for you to take good care of yourself and follow your caregiver's treatment plan. CAUSES   Health conditions:   High blood pressure (hypertension) causes the heart muscle to work harder than normal. When pressure in the blood vessels is high, the heart needs to pump (contract) with more force in order to circulate blood throughout the body. High blood pressure eventually causes the heart to become stiff and weak.   Coronary artery disease (CAD) is the buildup of cholesterol and fat (plaques) in the arteries of the heart. The blockage in the arteries deprives the heart muscle of oxygen and blood. This can cause chest pain and may lead to a heart attack. High blood pressure can also contribute to CAD.   Heart attack (myocardial infarction) occurs when 1 or more arteries in the heart become blocked. The loss of oxygen damages the muscle tissue of the heart. When this happens, part of the heart muscle dies. The injured tissue does not contract as well and weakens the heart's ability to pump blood.   Abnormal heart valves can cause HF when the heart valves do not open and close properly. This makes the heart muscle pump harder to keep the blood flowing.   Heart muscle disease (cardiomyopathy or myocarditis) is damage to the heart muscle from a variety of causes. These can include drug or alcohol abuse, infections, or unknown reasons. These can increase the risk of HF.   Lung disease makes the heart work harder because the lungs do not work properly. This can cause a strain on the heart leading it to fail.   Diabetes increases the risk of HF. High blood sugar contributes  to high fat (lipid) levels in the blood. Diabetes can also cause slow damage to tiny blood vessels that carry important nutrients to the heart muscle. When the heart does not get enough oxygen and food, it can cause the heart to become weak and stiff. This leads to a heart that does not contract efficiently.   Other diseases can contribute to HF. These include abnormal heart rhythms, thyroid problems, and low blood counts (anemia).   Unhealthy lifestyle habits:   Obesity.   Smoking.   Eating foods high in fat and cholesterol.   Eating or drinking beverages high in salt.   Drug or alcohol abuse.   Lack of exercise.  SYMPTOMS  HF symptoms may vary and can be hard to detect. Symptoms may include:  Shortness of breath with activity, such as climbing stairs.   Persistent cough.   Swelling of the feet, ankles, legs, or abdomen.   Unexplained weight gain.   Difficulty breathing when lying flat.   Waking from sleep because of the need to sit up and get more air.   Rapid heartbeat.   Fatigue and loss of energy.   Feeling lightheaded or close to fainting.  DIAGNOSIS  A diagnosis of HF is based on your history, symptoms, physical examination, and diagnostic tests. Diagnostic tests for HF may include:  EKG.   Chest X-ray.   Blood tests.   Exercise stress test.   Blood oxygen test (arterial blood gas).   Evaluation   by a heart doctor (cardiologist).   Ultrasound evaluation of the heart (echocardiogram).   Heart artery test to look for blockages (angiogram).   Radioactive imaging to look at the heart (radionuclide test).  TREATMENT  Treatment is aimed at managing the symptoms of HF. Medicines, lifestyle changes, or surgical intervention may be necessary to treat HF.  Medicines to help treat HF may include:   Angiotensin-converting enzyme (ACE) inhibitors. These block the effects of a blood protein called angiotensin-converting enzyme. ACE inhibitors relax (dilate) the  blood vessels and help lower blood pressure. This decreases the workload of the heart, slows the progression of HF, and improves symptoms.   Angiotensin receptor blockers (ARBs). These medications work similar to ACE inhibitors. ARBs may be an alternative for people who cannot tolerate an ACE inhibitor.   Aldosterone antagonists. This medication helps get rid of extra fluid from your body. This lowers the volume of blood the heart has to pump.   Water pills (diuretics). Diuretics cause the kidneys to remove salt and water from the blood. The extra fluid is removed by urination. By removing extra fluid from the body, diuretics help lower the workload of the heart and help prevent fluid buildup in the lungs so breathing is easier.   Beta blockers. These prevent the heart from beating too fast and improve heart muscle strength. Beta blockers help maintain a normal heart rate, control blood pressure, and improve HF symptoms.   Digitalis. This increases the force of the heartbeat and may be helpful to people with HF or heart rhythm problems.   Healthy lifestyle changes include:   Stopping smoking.   Eating a healthy diet. Avoid foods high in fat. Avoid foods fried in oil or made with fat. A dietician can help with healthy food choices.   Limiting how much salt you eat.   Limiting alcohol intake to no more than 1 drink per day for women and 2 drinks per day for men. Drinking more than that is harmful to your heart. If your heart has already been damaged by alcohol or you have severe HF, drinking alcohol should be stopped completely.   Exercising as directed by your caregiver.   Surgical treatment for HF may include:   Procedures to open blocked arteries, repair damaged heart valves, or remove damaged heart muscle tissue.   A pacemaker to help heart muscle function and to control certain abnormal heart rhythms.   A defibrillator to possibly prevent sudden cardiac death.  HOME CARE  INSTRUCTIONS   Activity level. Your caregiver can help you determine what type of exercise program may be helpful. It is important to maintain your strength. Pace your physical activity to avoid shortness of breath or chest pain. Rest for 1 hour before and after meals. A cardiac rehabilitation program may be helpful to some people with HF.   Diet. Eat a heart healthy diet. Food choices should be low in saturated fat and cholesterol. Talk to a dietician to learn about heart healthy foods.   Salt intake. When you have HF, you need to limit the amount of salt you eat. Eat less than 1500 milligrams (mg) of salt per day or as recommended by your caregiver.   Weight monitoring. Weigh yourself every day. You should weigh yourself in the morning after you urinate and before you eat breakfast. Wear the same amount of clothing each time you weigh yourself. Record your weight daily. Bring your recorded weights to your clinic visits. Tell your caregiver right away if   you have gained 3 lb/1.4 kg in 1 day, or 5 lb/2.3 kg in a week or whatever amount you were told to report.   Blood pressure monitoring. This should be done as directed by your caregiver. A home blood pressure cuff can be purchased at a drugstore. Record your blood pressure numbers and bring them to your clinic visits. Tell your caregiver if you become dizzy or lightheaded upon standing up.   Smoking. If you are currently a smoker, it is time to quit. Nicotine makes your heart work harder by causing your blood vessels to constrict. Do not use nicotine gum or patches before talking to your caregiver.   Follow up. Be sure to schedule a follow-up visit with your caregiver. Keep all your appointments.  SEEK MEDICAL CARE IF:   Your weight increases by 3 lb/1.4 kg in 1 day or 5 lb/2.3 kg in a week.   You notice increasing shortness of breath that is unusual for you. This may happen during rest, sleep, or with activity.   You cough more than normal,  especially with physical activity.   You notice more swelling in your hands, feet, ankles, or belly (abdomen).   You are unable to sleep because it is hard to breathe.   You cough up bloody mucus (sputum).   You begin to feel "jumping" or "fluttering" sensations (palpitations) in your chest.  SEEK IMMEDIATE MEDICAL CARE IF:   You have severe chest pain or pressure which may include symptoms such as:   Pain or pressure in the arms, neck, jaw, or back.   Feeling sweaty.   Feeling sick to your stomach (nauseous).   Feeling short of breath while at rest.   Having a fast or irregular heartbeat.   You experience stroke symptoms. These symptoms include:   Facial weakness or numbness.   Weakness or numbness in an arm, leg, or on one side of your body.   Blurred vision.   Difficulty talking or thinking.   Dizziness or fainting.   Severe headache.  THESE ARE MEDICAL EMERGENCIES. Do not wait to see if the symptoms go away. Call your local emergency services (911 in [place] DO NOT drive yourself to the hospital. IMPORTANT  Make a list of every medicine, vitamin, or herbal supplement you are taking. Keep the list with you at all times. Show it to your caregiver at every visit. Keep the list up-to-date.   Ask your caregiver or pharmacist to write an explanation of each medicine you are taking. This should include:   Why you are taking it.   The possible side effects.   The best time of day to take it.   Foods to take with it or what foods to avoid.   When to stop taking it.  MAKE SURE YOU:   Understand these instructions.   Will watch your condition.   Will get help right away if you are not doing well or get worse.  Document Released: 06/01/2005 Document Revised: 05/21/2011 Document Reviewed: 09/13/2009 [employer] Patient Information 2012 [employer]. 

## 2011-10-29 NOTE — Progress Notes (Signed)
Please see discharge summary for today  Debbora Presto  [facility], pager #: [phone] Main office number: [phone]

## 2011-10-29 NOTE — Progress Notes (Signed)
Glycemic Control Recommendations   Elevated fasting glucose: 244 mg/dL     Recommendation:             Add Lantus 15 units daily

## 2011-10-29 NOTE — Discharge Summary (Signed)
Patient ID: TEDFORD BERG MRN: [id number] DOB/AGE: 10/09/1931 78 y.o.  Admit date: 10/26/2011 Discharge date: 10/29/2011  Primary Care Physician:  Beverley Fiedler, MD, MD  Discharge Diagnoses:  Shortness of breath secondary to acute on chronic moderate to severe systolic CHF exacerbation  Principal Problem:  *Congestive heart failure Active Problems:  Normocytic anemia  Shortness of breath  Hypertension  Dyslipidemia  Atrial fibrillation   Medication List  As of 10/29/2011  5:02 PM   STOP taking these medications         carvedilol 25 MG tablet      digoxin 0.25 MG tablet         TAKE these medications         aspirin 81 MG tablet   Take 81 mg by mouth daily.      ferrous sulfate 325 (65 FE) MG tablet   Take 325 mg by mouth daily with breakfast.      furosemide 20 MG tablet   Commonly known as: LASIX   Take 3 tablets (60 mg total) by mouth daily.      glipiZIDE 10 MG 24 hr tablet   Commonly known as: GLUCOTROL XL   Take 10 mg by mouth 2 (two) times daily.      isosorbide mononitrate 30 MG 24 hr tablet   Commonly known as: IMDUR   Take 30 mg by mouth daily.      metFORMIN 500 MG tablet   Commonly known as: GLUCOPHAGE   Take 1,000 mg by mouth 2 (two) times daily.      ramipril 10 MG capsule   Commonly known as: ALTACE   Take 10 mg by mouth 2 (two) times daily.      RAPAFLO 8 MG Caps capsule   Generic drug: silodosin   Take 8 mg by mouth daily with breakfast.      simvastatin 20 MG tablet   Commonly known as: ZOCOR   Take 20 mg by mouth every evening.      warfarin 5 MG tablet   Commonly known as: COUMADIN   Take 5 mg by mouth See admin instructions. Pt takes 1 and 1/2 tablets 7.5 mg daily except, takes 10 mg 2 tablets on Monday and Friday            Disposition and Follow-up: Pt will need to se EP specialist Dr. Ladona Ridgel with [facility] for further evaluation of AICD placement. He will also need to see Dr. Eldridge Dace in 1-2 weeks post  discharge. Please note that due to bradycardia digoxin and coreg were both held during the hospitalization. Pt was started on Lasix and prefers to take it only once a day and the determined dose based on clinically response was Lasix 60 mg PO QD. Pt was advised to check weight regularly and to write it down and to notify the cardiologist of new and sudden onset weight gain with shortness of breath.  Consults:  cardiology  Significant Diagnostic Studies:   Dg Chest 2 View 10/26/2011  IMPRESSION:  Suspected chronic interstitial lung disease.  No definite acute superimposed process.    Ct Angio Chest W/cm &/or Wo Cm 10/26/2011    IMPRESSION:   1.  Negative for pulmonary embolism.   2.  Overall findings compatible with pulmonary edema with small bilateral effusions and bibasilar opacities, right greater than left, atelectasis versus infiltrate.   3.  Apparent enlargement of the right atrium and main pulmonary artery, may be seen in the setting of pulmonary arterial  hypertension.  Further evaluation with cardiac echo may be performed as clinically indicated.   4.  Nonspecific mediastinal lymphadenopathy, possibly reactive in etiology.    10/27/2011 2 D ECHO - Left ventricle: Systolic function was moderately to severely reduced. The estimated EF = 30% to 35%. Severe hypokinesis of the distalanteroseptal and apical myocardium. - Mitral valve: Calcified annulus. Mildly thickened leaflets. Mild regurgitation. - Left atrium: The atrium was moderately dilated. - Right atrium: The atrium was mildly dilated. - Pulmonary arteries: Systolic pressure was moderately increased. PA peak pressure: 58mm Hg (S).  Brief H and P: Pt is 78 yo male who presented to [facility] ED with main concern of progressively worsening shortness of breath associated with worsening bilateral lower extremity edema and 3-4 pillow orthopnea. This has started approximately 3-4 days prior to admission and pt has also noted significant weight  gain but is unsure of the exact numbers. He reports similar episodes in the past but not of this extent. He does report being on Lasix in the past but was told to stop taking it at one point. He denies fevers, chills, abdominal or urinary concerns, no specific aggravating or alleviating factors.   Physical Exam on Discharge:  Filed Vitals:   10/29/11 0543 10/29/11 1356  BP: 172/20 151/65  Pulse: 80 67  Temp: 97.9 F (36.6 C) 97.5 F (36.4 C)  TempSrc: Oral Oral  Resp: 20 20  Height:    Weight: 113.581 kg or 250 lbs   SpO2: 95% 98%    Intake/Output Summary (Last 24 hours) at 10/29/11 1702 Last data filed at 10/29/11 1500  Gross per 24 hour  Intake    897 ml  Output   1875 ml  Net   -978 ml    General: Alert, awake, oriented x3, in no acute distress. HEENT: No bruits, no goiter. Heart: Regular rate and rhythm, without murmurs, rubs, gallops. Lungs: Clear to auscultation bilaterally with bibasilar crackles Abdomen: Soft, nontender, nondistended, positive bowel sounds. Extremities: No clubbing cyanosis, +2 bilateral pitting edema with positive pedal pulses. Neuro: Grossly intact, nonfocal.  Lab Results:  Lab 10/29/11 0520 10/27/11 0345 10/26/11 1440  WBC 11.5* 6.6 9.5  HGB 12.2* 12.1* 12.1*  HCT 37.9* 37.9* 37.8*  PLT 254 233 248   Lab 10/29/11 0520 10/27/11 0345 10/26/11 1440  NA 140 139 137  K 3.8 4.4 4.5  CL 100 102 101  CO2 30 30 29   GLUCOSE 244* 194* 124*  BUN 44* 26* 21  CREATININE 0.93 0.84 0.59  CALCIUM 8.9 8.8 8.7    Lab 10/29/11 0520 10/28/11 0510 10/27/11 0345 10/26/11 1839 10/26/11 1440  INR 2.32* 2.03* 2.16* 2.24* 2.23*  PROTIME -- -- -- -- --   Cardiac markers:  Lab 10/27/11 1216 10/27/11 0345 10/26/11 2054  CKMB 2.5 2.7 3.0  TROPONINI <0.30 <0.30 <0.30  MYOGLOBIN -- -- --    Hospital Course:   1. Acute on chronic systolic HF.   Pt has responded well to IV Lasix 40 mg BID and has maintained good UOP.  Much improved. the patient has been  ruled out for PE, although he has been therapeutic on his INR.   The patient's CT scan is consistent with congestive heart failure, along with BNP and physical exam findings  2-D ECHO yields EF 30-35% with diffuse hypokinesis. Negative cardiac enzymes, TSH of 1.544.   Will transition lasix to po, monitor renal function closely in an outpatient setting. Resume ACE and discontinue steroids.   Volume status neg  4L for this hospitalization. Appreciate assistance from cardiology.   Please note that leukocytosis was determined to be secondary to steroid use rather than the true infectious etiology.  2. Hypertension.   Coreg discontinued 5/14 afternoon for HR 40.   BP stable but trending up.   Monitor closely and resume  ACE.   3. atrial fibrillation.   HR trended from 60's to 40 On 5/14.   Coreg and digoxin discontinued.   HR in 60's today. Appreciate cardiology assistance.  Anticoagulation per pharmacy   4. Diabetes.   CBG range 186-282. Of note pt on solumedrol.   Continued sliding scale insulin, and added lantus per rec of DM coordinator. hemoglobin A1c of 7.7   5. Disposition.  plan of care and diagnosis, diagnostic studies and test results were discussed with pt and family at bedside (wife)  pt and  family verbalized understanding  Time spent on Discharge: Over 30 minutes  Signed: Debbora Presto 10/29/2011, 5:02 PM  [facility], pager #: [phone] Main office number: [phone]

## 2011-10-29 NOTE — Progress Notes (Signed)
Subjective: Up in chair. NAD. No events during night.   Objective: Vital signs Filed Vitals:   10/28/11 2043 10/28/11 2112 10/29/11 0325 10/29/11 0543  BP: 134/68   172/20  Pulse: 74   80  Temp: 98.1 F (36.7 C)   97.9 F (36.6 C)  TempSrc: Oral   Oral  Resp: 20   20  Height:      Weight:   113.581 kg (250 lb 6.4 oz)   SpO2: 94% 95%  90%   Weight change: -2.919 kg (-6 lb 7 oz) Last BM Date: 10/27/11  Intake/Output from previous day: 05/15 0701 - 05/16 0700 In: 657 [P.O.:600; I.V.:53; IV Piggyback:4] Out: 1950 [Urine:1950] Total I/O In: -  Out: 200 [Urine:200]   Physical Exam: General: Alert, awake, oriented x3, in no acute distress.  HEENT: No bruits, no goiter. Mucus membranes mouth moist/pink. PERRL  Heart: Iregular rate and rhythm, without murmurs, rubs, gallops. Lungs:Normal effort. Breath sounds with fine crackles bases. Otherwise clear to auscultation bilaterally. Abdomen:  Obese, Soft, nontender, nondistended, positive bowel sounds. Extremities: No clubbing cyanosis  with positive pedal pulses. 1-2+ LEE.  Neuro: Grossly intact, nonfocal.    Lab Results: Basic Metabolic Panel:  Basename 10/29/11 0520 10/27/11 0345  NA 140 139  K 3.8 4.4  CL 100 102  CO2 30 30  GLUCOSE 244* 194*  BUN 44* 26*  CREATININE 0.93 0.84  CALCIUM 8.9 8.8  MG -- --  PHOS -- --   Liver Function Tests:  Basename 10/27/11 0345 10/26/11 2047  AST 13 14  ALT 10 10  ALKPHOS 55 59  BILITOT 0.8 0.9  PROT 6.9 7.5  ALBUMIN 3.2* 3.5   No results found for this basename: LIPASE:2,AMYLASE:2 in the last 72 hours No results found for this basename: AMMONIA:2 in the last 72 hours CBC:  Basename 10/29/11 0520 10/27/11 0345 10/26/11 1440  WBC 11.5* 6.6 --  NEUTROABS -- -- 7.4  HGB 12.2* 12.1* --  HCT 37.9* 37.9* --  MCV 90.2 91.1 --  PLT 254 233 --   Cardiac Enzymes:  Basename 10/27/11 1216 10/27/11 0345 10/26/11 2054  CKTOTAL 37 32 38  CKMB 2.5 2.7 3.0  CKMBINDEX -- -- --   TROPONINI <0.30 <0.30 <0.30   BNP:  Basename 10/26/11 1440  PROBNP 2287.0*   D-Dimer:  Basename 10/26/11 1440  DDIMER 0.96*   CBG:  Basename 10/29/11 0725 10/28/11 2042 10/28/11 1651 10/26/11 2205  GLUCAP 217* 241* 282* 186*   Hemoglobin A1C:  Basename 10/26/11 2047  HGBA1C 7.7*   Fasting Lipid Panel: No results found for this basename: CHOL,HDL,LDLCALC,TRIG,CHOLHDL,LDLDIRECT in the last 72 hours Thyroid Function Tests:  [facility] 10/26/11 2047  TSH 1.544  T4TOTAL --  FREET4 --  T3FREE --  THYROIDAB --   Anemia Panel: No results found for this basename: VITAMINB12,FOLATE,FERRITIN,TIBC,IRON,RETICCTPCT in the last 72 hours Coagulation:  Basename 10/29/11 0520 10/28/11 0510  LABPROT 25.9* 23.3*  INR 2.32* 2.03*   Urine Drug Screen: Drugs of Abuse  No results found for this basename: labopia, cocainscrnur, labbenz, amphetmu, thcu, labbarb    Alcohol Level: No results found for this basename: ETH:2 in the last 72 hours Urinalysis: No results found for this basename: COLORURINE:2,APPERANCEUR:2,LABSPEC:2,PHURINE:2,GLUCOSEU:2,HGBUR:2,BILIRUBINUR:2,KETONESUR:2,PROTEINUR:2,UROBILINOGEN:2,NITRITE:2,LEUKOCYTESUR:2 in the last 72 hours Misc. Labs:  No results found for this or any previous visit (from the past 240 hour(s)).  Studies/Results: No results found.  Medications: Scheduled Meds:   . aspirin  81 mg Oral Daily  . ferrous sulfate  325 mg Oral QPC  breakfast  . furosemide  40 mg Intravenous Q12H  . glipiZIDE  10 mg Oral BID AC  . insulin aspart  0-15 Units Subcutaneous TID WC  . isosorbide mononitrate  30 mg Oral Daily  . methylPREDNISolone (SOLU-MEDROL) injection  60 mg Intravenous Q12H  . ramipril  10 mg Oral BID  . silodosin  8 mg Oral Q breakfast  . simvastatin  20 mg Oral QPM  . sodium chloride  3 mL Intravenous Q12H  . warfarin  7.5 mg Oral ONCE-1800  . Warfarin - Pharmacist Dosing Inpatient   Does not apply q1800  . DISCONTD: albuterol  2.5 mg  Nebulization TID  . DISCONTD: ipratropium  0.5 mg Nebulization TID   Continuous Infusions:  PRN Meds:.sodium chloride, acetaminophen, acetaminophen, albuterol, ondansetron (ZOFRAN) IV, ondansetron, sodium chloride  Assessment/Plan:  Principal Problem:  *Congestive heart failure Active Problems:  Normocytic anemia  Shortness of breath  Hypertension  Dyslipidemia  Atrial fibrillation #1 Acute on chronic systolic HF. Much improved. the patient has been ruled out for PE, although he has been therapeutic on his INR. The patient's CT scan is consistent with congestive heart failure, 2-D echo yields EF 30-35% with diffuse hypokinesis. Negative cardiac enzymes, TSH of 1.544. Will transition lasix to po, monitor renal function closely. Resume ACE and discontinue steroids.  Volume status neg 4L for this hospitalization. Appreciate assistance from cardiology.  #2 Hypertension. Coreg discontinued 5/14 afternoon for HR 40. BP stable but trending up. Monitor closely and consider resuming ACE.  #3 atrial fibrillation: HR trended from 60's to 40 On 5/14. Coreg and dig discontinued. HR in 60's today. Appreciate cardiology assistance.  anticoagulation per pharmacy  #4 diabetes CBG range 186-282. Of note pt on solumedrol.  Continue  sliding scale insulin, will add lantus per rec of DM coordinator.  will continue glipizide and hold metformin , hemoglobin A1c of 7.7       LOS: 3 days   [facility] M 10/29/2011, 9:04 AM

## 2011-10-30 ENCOUNTER — Institutional Professional Consult (permissible substitution): Payer: Medicare Other | Admitting: Internal Medicine

## 2011-11-03 ENCOUNTER — Encounter: Payer: Self-pay | Admitting: Internal Medicine

## 2011-11-03 ENCOUNTER — Encounter: Payer: Self-pay | Admitting: *Deleted

## 2011-11-03 ENCOUNTER — Ambulatory Visit (INDEPENDENT_AMBULATORY_CARE_PROVIDER_SITE_OTHER): Payer: Medicare Other | Admitting: Internal Medicine

## 2011-11-03 VITALS — BP 108/56 | HR 72 | Ht 72.0 in

## 2011-11-03 DIAGNOSIS — I4891 Unspecified atrial fibrillation: Secondary | ICD-10-CM

## 2011-11-03 DIAGNOSIS — I10 Essential (primary) hypertension: Secondary | ICD-10-CM

## 2011-11-03 DIAGNOSIS — I509 Heart failure, unspecified: Secondary | ICD-10-CM

## 2011-11-03 NOTE — Assessment & Plan Note (Signed)
His blood pressure is controlled. He is encouraged to maintain a low-sodium diet. 

## 2011-11-03 NOTE — Progress Notes (Signed)
HPI Mr. Torrisi is referred today by Dr. Varanasi for consideration for prophylactic ICD implantation. The patient has a long-standing history of an ischemic cardiomyopathy and worsening congestive heart failure. His ejection fraction is 30-35% by echo despite maximal medical therapy. 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 Attempt to cross the lesion were unsuccessful. The patient has had chronic atrial fibrillation for over 10 years. He has not had syncope. Over the last several weeks, he has had worsening shortness of breath, peripheral edema, and volume overload. He has not had syncope. His weight is improved now that he has been undergoing diuresis initially with intravenous Lasix and now with oral Lasix. No Known Allergies   Current Outpatient Prescriptions  Medication Sig Dispense Refill  . aspirin 81 MG tablet Take 81 mg by mouth daily.      . ferrous sulfate 325 (65 FE) MG tablet Take 325 mg by mouth daily with breakfast.      . furosemide (LASIX) 20 MG tablet Take 3 tablets (60 mg total) by mouth daily.  90 tablet  1  . glipiZIDE (GLUCOTROL XL) 10 MG 24 hr tablet Take 10 mg by mouth 2 (two) times daily.      . isosorbide mononitrate (IMDUR) 30 MG 24 hr tablet Take 30 mg by mouth daily.      . metFORMIN (GLUCOPHAGE) 500 MG tablet Take 1,000 mg by mouth 2 (two) times daily.      . ramipril (ALTACE) 10 MG capsule Take 10 mg by mouth 2 (two) times daily.      . silodosin (RAPAFLO) 8 MG CAPS capsule Take 8 mg by mouth daily with breakfast.      . simvastatin (ZOCOR) 20 MG tablet Take 20 mg by mouth every evening.      . warfarin (COUMADIN) 5 MG tablet Take 5 mg by mouth See admin instructions. Pt takes 1 and 1/2 tablets 7.5 mg daily except, takes 10 mg 2 tablets on Monday and Friday         Past Medical History  Diagnosis Date  . Atrial fibrillation   . Diabetes mellitus   . Hypertension   . Coronary artery disease   .  Hyperlipemia     ROS:   All systems reviewed and negative except as noted in the HPI.   Past Surgical History  Procedure Date  . Cholecystectomy      No family history on file.   History   Social History  . Marital Status: Married    Spouse Name: N/A    Number of Children: N/A  . Years of Education: N/A   Occupational History  . Not on file.   Social History Main Topics  . Smoking status: Former Smoker    Quit date: 02/13/2001  . Smokeless tobacco: Never Used  . Alcohol Use: No  . Drug Use: Not on file  . Sexually Active: No   Other Topics Concern  . Not on file   Social History Narrative  . No narrative on file     BP 108/56  Pulse 72  Ht 6' (1.829 m)  Physical Exam:  ill appearing 78-year-old man, NAD HEENT: Unremarkable Neck:  7 cm JVD, no thyromegally Lungs:  Clear with no wheezes, rhonchi, but basilar rales are present. HEART:  Regular rate rhythm, no murmurs, no rubs, no clicks Abd:  soft, positive bowel sounds, no organomegally, no rebound, no guarding Ext:  2 plus pulses, no   edema, no cyanosis, no clubbing Skin:  No rashes no nodules Neuro:  CN II through XII intact, motor grossly intact  DEVICE  Normal device function.  See PaceArt for details.   Assess/Plan:   

## 2011-11-03 NOTE — Assessment & Plan Note (Signed)
His chronic systolic heart failure is class 2-3. I discussed the treatment options with the patient. The risk, goals, benefits, and expectations of ICD implantation have been discussed with the patient and he wishes to proceed. This will be scheduled at the earliest possible pending at time.

## 2011-11-03 NOTE — Patient Instructions (Signed)

## 2011-11-03 NOTE — Assessment & Plan Note (Signed)
His ventricular rate appears to be well-controlled. He will continue his current medical therapy. 

## 2011-11-05 ENCOUNTER — Other Ambulatory Visit: Payer: Self-pay | Admitting: *Deleted

## 2011-11-05 DIAGNOSIS — I509 Heart failure, unspecified: Secondary | ICD-10-CM

## 2011-11-05 DIAGNOSIS — I4891 Unspecified atrial fibrillation: Secondary | ICD-10-CM

## 2011-11-12 ENCOUNTER — Other Ambulatory Visit (INDEPENDENT_AMBULATORY_CARE_PROVIDER_SITE_OTHER): Payer: Medicare Other

## 2011-11-12 DIAGNOSIS — I509 Heart failure, unspecified: Secondary | ICD-10-CM

## 2011-11-12 DIAGNOSIS — I4891 Unspecified atrial fibrillation: Secondary | ICD-10-CM

## 2011-11-12 LAB — CBC WITH DIFFERENTIAL/PLATELET
Basophils Relative: 0.7 % (ref 0.0–3.0)
Eosinophils Relative: 0.8 % (ref 0.0–5.0)
HCT: 33.7 % — ABNORMAL LOW (ref 39.0–52.0)
Hemoglobin: 10.8 g/dL — ABNORMAL LOW (ref 13.0–17.0)
Lymphocytes Relative: 18.5 % (ref 12.0–46.0)
Lymphs Abs: 1.7 10*3/uL (ref 0.7–4.0)
Monocytes Relative: 8.3 % (ref 3.0–12.0)
Neutro Abs: 6.7 10*3/uL (ref 1.4–7.7)
RBC: 3.7 Mil/uL — ABNORMAL LOW (ref 4.22–5.81)
RDW: 15.1 % — ABNORMAL HIGH (ref 11.5–14.6)
WBC: 9.4 10*3/uL (ref 4.5–10.5)

## 2011-11-12 LAB — BASIC METABOLIC PANEL
BUN: 19 mg/dL (ref 6–23)
CO2: 32 mEq/L (ref 19–32)
Calcium: 8.4 mg/dL (ref 8.4–10.5)
Chloride: 100 mEq/L (ref 96–112)
Creatinine, Ser: 0.6 mg/dL (ref 0.4–1.5)
Glucose, Bld: 69 mg/dL — ABNORMAL LOW (ref 70–99)

## 2011-11-12 LAB — PROTIME-INR: INR: 2.4 ratio — ABNORMAL HIGH (ref 0.8–1.0)

## 2011-11-18 MED ORDER — CEFAZOLIN SODIUM-DEXTROSE 2-3 GM-% IV SOLR
2.0000 g | INTRAVENOUS | Status: DC
  Filled 2011-11-18 (×2): qty 50

## 2011-11-18 MED ORDER — SODIUM CHLORIDE 0.9 % IR SOLN
80.0000 mg | Status: DC
  Filled 2011-11-18: qty 2

## 2011-11-19 ENCOUNTER — Encounter (HOSPITAL_COMMUNITY): Payer: Self-pay | Admitting: General Practice

## 2011-11-19 ENCOUNTER — Encounter (HOSPITAL_COMMUNITY)
Admission: RE | Disposition: A | Discharge status: Home / Self Care | Payer: Self-pay | Source: Ambulatory Visit | Attending: Internal Medicine

## 2011-11-19 ENCOUNTER — Ambulatory Visit (HOSPITAL_COMMUNITY)
Admission: RE | Admit: 2011-11-19 | Discharge: 2011-11-20 | Disposition: A | Discharge status: Home / Self Care | Payer: Medicare Other | Source: Ambulatory Visit | Attending: Internal Medicine | Admitting: Internal Medicine

## 2011-11-19 DIAGNOSIS — Z7901 Long term (current) use of anticoagulants: Secondary | ICD-10-CM | POA: Insufficient documentation

## 2011-11-19 DIAGNOSIS — Z95810 Presence of automatic (implantable) cardiac defibrillator: Secondary | ICD-10-CM

## 2011-11-19 DIAGNOSIS — I10 Essential (primary) hypertension: Secondary | ICD-10-CM | POA: Insufficient documentation

## 2011-11-19 DIAGNOSIS — E119 Type 2 diabetes mellitus without complications: Secondary | ICD-10-CM | POA: Insufficient documentation

## 2011-11-19 DIAGNOSIS — I2589 Other forms of chronic ischemic heart disease: Secondary | ICD-10-CM | POA: Insufficient documentation

## 2011-11-19 DIAGNOSIS — I2510 Atherosclerotic heart disease of native coronary artery without angina pectoris: Secondary | ICD-10-CM | POA: Insufficient documentation

## 2011-11-19 DIAGNOSIS — I4891 Unspecified atrial fibrillation: Secondary | ICD-10-CM | POA: Insufficient documentation

## 2011-11-19 DIAGNOSIS — E785 Hyperlipidemia, unspecified: Secondary | ICD-10-CM

## 2011-11-19 DIAGNOSIS — I509 Heart failure, unspecified: Secondary | ICD-10-CM | POA: Insufficient documentation

## 2011-11-19 DIAGNOSIS — R0602 Shortness of breath: Secondary | ICD-10-CM

## 2011-11-19 HISTORY — DX: Heart failure, unspecified: I50.9

## 2011-11-19 HISTORY — DX: Presence of automatic (implantable) cardiac defibrillator: Z95.810

## 2011-11-19 HISTORY — PX: IMPLANTABLE CARDIOVERTER DEFIBRILLATOR IMPLANT: SHX5473

## 2011-11-19 HISTORY — PX: CARDIAC DEFIBRILLATOR PLACEMENT: SHX171

## 2011-11-19 LAB — SURGICAL PCR SCREEN
MRSA, PCR: NEGATIVE
Staphylococcus aureus: POSITIVE — AB

## 2011-11-19 LAB — PROTIME-INR: Prothrombin Time: 24.3 seconds — ABNORMAL HIGH (ref 11.6–15.2)

## 2011-11-19 SURGERY — IMPLANTABLE CARDIOVERTER DEFIBRILLATOR IMPLANT
Anesthesia: LOCAL

## 2011-11-19 MED ORDER — RAMIPRIL 10 MG PO CAPS
10.0000 mg | ORAL_CAPSULE | Freq: Two times a day (BID) | ORAL | Status: DC
  Administered 2011-11-19 – 2011-11-20 (×2): 10 mg via ORAL
  Filled 2011-11-19 (×4): qty 1

## 2011-11-19 MED ORDER — ACETAMINOPHEN 325 MG PO TABS
325.0000 mg | ORAL_TABLET | ORAL | Status: DC | PRN
  Administered 2011-11-20: 650 mg via ORAL
  Filled 2011-11-19: qty 2

## 2011-11-19 MED ORDER — CHLORHEXIDINE GLUCONATE 4 % EX LIQD: 60.0000 mL | Freq: Once | CUTANEOUS | Status: DC

## 2011-11-19 MED ORDER — FENTANYL CITRATE 0.05 MG/ML IJ SOLN
INTRAMUSCULAR | Status: AC
  Filled 2011-11-19: qty 2

## 2011-11-19 MED ORDER — MUPIROCIN 2 % EX OINT
TOPICAL_OINTMENT | CUTANEOUS | Status: AC
  Administered 2011-11-19: 1 via NASAL
  Filled 2011-11-19: qty 22

## 2011-11-19 MED ORDER — WARFARIN SODIUM 10 MG PO TABS
10.0000 mg | ORAL_TABLET | ORAL | Status: DC
  Filled 2011-11-19: qty 1

## 2011-11-19 MED ORDER — METFORMIN HCL 500 MG PO TABS
1000.0000 mg | ORAL_TABLET | Freq: Two times a day (BID) | ORAL | Status: DC
  Administered 2011-11-19 – 2011-11-20 (×2): 1000 mg via ORAL
  Filled 2011-11-19 (×4): qty 2

## 2011-11-19 MED ORDER — SODIUM CHLORIDE 0.9 % IV SOLN: 250.0000 mL | INTRAVENOUS | Status: DC

## 2011-11-19 MED ORDER — MIDAZOLAM HCL 2 MG/2ML IJ SOLN
INTRAMUSCULAR | Status: AC
  Filled 2011-11-19: qty 2

## 2011-11-19 MED ORDER — ISOSORBIDE MONONITRATE ER 30 MG PO TB24
30.0000 mg | ORAL_TABLET | Freq: Every day | ORAL | Status: DC
  Administered 2011-11-20: 30 mg via ORAL
  Filled 2011-11-19: qty 1

## 2011-11-19 MED ORDER — TAMSULOSIN HCL 0.4 MG PO CAPS
0.4000 mg | ORAL_CAPSULE | Freq: Every day | ORAL | Status: DC
  Administered 2011-11-20: 0.4 mg via ORAL
  Filled 2011-11-19 (×2): qty 1

## 2011-11-19 MED ORDER — ASPIRIN EC 81 MG PO TBEC
81.0000 mg | DELAYED_RELEASE_TABLET | Freq: Every day | ORAL | Status: DC
  Administered 2011-11-20: 81 mg via ORAL
  Filled 2011-11-19: qty 1

## 2011-11-19 MED ORDER — WARFARIN SODIUM 7.5 MG PO TABS
7.5000 mg | ORAL_TABLET | ORAL | Status: DC
  Administered 2011-11-19: 7.5 mg via ORAL
  Filled 2011-11-19: qty 1

## 2011-11-19 MED ORDER — WARFARIN SODIUM 5 MG PO TABS: 5.0000 mg | ORAL_TABLET | ORAL | Status: DC

## 2011-11-19 MED ORDER — FERROUS SULFATE 325 (65 FE) MG PO TABS
325.0000 mg | ORAL_TABLET | Freq: Every day | ORAL | Status: DC
  Administered 2011-11-20: 325 mg via ORAL
  Filled 2011-11-19 (×2): qty 1

## 2011-11-19 MED ORDER — ONDANSETRON HCL 4 MG/2ML IJ SOLN: 4.0000 mg | Freq: Four times a day (QID) | INTRAMUSCULAR | Status: DC | PRN

## 2011-11-19 MED ORDER — FUROSEMIDE 40 MG PO TABS
60.0000 mg | ORAL_TABLET | Freq: Every day | ORAL | Status: DC
  Administered 2011-11-19: 60 mg via ORAL
  Filled 2011-11-19 (×3): qty 1

## 2011-11-19 MED ORDER — SODIUM CHLORIDE 0.45 % IV SOLN
INTRAVENOUS | Status: DC
  Administered 2011-11-19: 10:00:00 via INTRAVENOUS

## 2011-11-19 MED ORDER — LIDOCAINE HCL (PF) 1 % IJ SOLN
INTRAMUSCULAR | Status: AC
  Filled 2011-11-19: qty 60

## 2011-11-19 MED ORDER — ASPIRIN 81 MG PO TABS: 81.0000 mg | ORAL_TABLET | Freq: Every day | ORAL | Status: DC

## 2011-11-19 MED ORDER — SIMVASTATIN 20 MG PO TABS
20.0000 mg | ORAL_TABLET | Freq: Every evening | ORAL | Status: DC
  Administered 2011-11-19: 20 mg via ORAL
  Filled 2011-11-19 (×2): qty 1

## 2011-11-19 MED ORDER — WARFARIN - PHYSICIAN DOSING INPATIENT: Freq: Every day | Status: DC

## 2011-11-19 MED ORDER — DIAZEPAM 5 MG PO TABS
5.0000 mg | ORAL_TABLET | Freq: Once | ORAL | Status: AC
  Administered 2011-11-19: 5 mg via ORAL

## 2011-11-19 MED ORDER — DIAZEPAM 5 MG PO TABS
ORAL_TABLET | ORAL | Status: AC
  Filled 2011-11-19: qty 1

## 2011-11-19 MED ORDER — CEFAZOLIN SODIUM-DEXTROSE 2-3 GM-% IV SOLR
2.0000 g | Freq: Four times a day (QID) | INTRAVENOUS | Status: AC
  Administered 2011-11-19 – 2011-11-20 (×3): 2 g via INTRAVENOUS
  Filled 2011-11-19 (×3): qty 50

## 2011-11-19 MED ORDER — GLIPIZIDE ER 10 MG PO TB24
10.0000 mg | ORAL_TABLET | Freq: Two times a day (BID) | ORAL | Status: DC
  Administered 2011-11-19 – 2011-11-20 (×2): 10 mg via ORAL
  Filled 2011-11-19 (×4): qty 1

## 2011-11-19 NOTE — Clinical Social Work Placement (Addendum)
     Clinical Social Work Department CLINICAL SOCIAL WORK PLACEMENT NOTE 11/20/2011  Patient:  Troy Cook, Troy Cook  Account Number:  [id number] Admit date:  11/19/2011  Clinical Social Worker:  Doree Albee  Date/time:  11/19/2011 04:00 PM  Clinical Social Work is seeking post-discharge placement for this patient at the following level of care:   SKILLED NURSING   (*CSW will update this form in Epic as items are completed)   11/19/2011  Patient/family provided with [facility] Department of Clinical Social Works list of facilities offering this level of care within the geographic area requested by the patient (or if unable, by the patients family).  11/19/2011  Patient/family informed of their freedom to choose among providers that offer the needed level of care, that participate in Medicare, Medicaid or managed care program needed by the patient, have an available bed and are willing to accept the patient.  11/19/2011  Patient/family informed of [facility] ownership interest in [facility], as well as of the fact that they are under no obligation to receive care at this facility.  PASARR submitted to EDS on 11/19/2011 PASARR number received from EDS on 11/20/2011  FL2 transmitted to all facilities in geographic area requested by pt/family on  11/19/2011 FL2 transmitted to all facilities within larger geographic area on   Patient informed that his/her managed care company has contracts with or will negotiate with  certain facilities, including the following:     Patient/family informed of bed offers received:  11/19/2011 Patient chooses bed at [facility] Physician recommends and patient chooses bed at    Patient to be transferred to [facility] [place] on  11/20/2011 Patient to be transferred to facility by facility transportation  The following physician request were entered in Epic:   Additional Comments:

## 2011-11-19 NOTE — H&P (View-Only) (Signed)
HPI Mr. Bail is referred today by Dr. Eldridge Dace for consideration for prophylactic ICD implantation. The patient has a long-standing history of an ischemic cardiomyopathy and worsening congestive heart failure. His ejection fraction is 30-35% by echo despite maximal medical therapy. 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 Attempt to cross the lesion were unsuccessful. The patient has had chronic atrial fibrillation for over 10 years. He has not had syncope. Over the last several weeks, he has had worsening shortness of breath, peripheral edema, and volume overload. He has not had syncope. His weight is improved now that he has been undergoing diuresis initially with intravenous Lasix and now with oral Lasix. No Known Allergies   Current Outpatient Prescriptions  Medication Sig Dispense Refill  . aspirin 81 MG tablet Take 81 mg by mouth daily.      . ferrous sulfate 325 (65 FE) MG tablet Take 325 mg by mouth daily with breakfast.      . furosemide (LASIX) 20 MG tablet Take 3 tablets (60 mg total) by mouth daily.  90 tablet  1  . glipiZIDE (GLUCOTROL XL) 10 MG 24 hr tablet Take 10 mg by mouth 2 (two) times daily.      . isosorbide mononitrate (IMDUR) 30 MG 24 hr tablet Take 30 mg by mouth daily.      . metFORMIN (GLUCOPHAGE) 500 MG tablet Take 1,000 mg by mouth 2 (two) times daily.      . ramipril (ALTACE) 10 MG capsule Take 10 mg by mouth 2 (two) times daily.      . silodosin (RAPAFLO) 8 MG CAPS capsule Take 8 mg by mouth daily with breakfast.      . simvastatin (ZOCOR) 20 MG tablet Take 20 mg by mouth every evening.      . warfarin (COUMADIN) 5 MG tablet Take 5 mg by mouth See admin instructions. Pt takes 1 and 1/2 tablets 7.5 mg daily except, takes 10 mg 2 tablets on Monday and Friday         Past Medical History  Diagnosis Date  . Atrial fibrillation   . Diabetes mellitus   . Hypertension   . Coronary artery disease   .  Hyperlipemia     ROS:   All systems reviewed and negative except as noted in the HPI.   Past Surgical History  Procedure Date  . Cholecystectomy      No family history on file.   History   Social History  . Marital Status: Married    Spouse Name: N/A    Number of Children: N/A  . Years of Education: N/A   Occupational History  . Not on file.   Social History Main Topics  . Smoking status: Former Smoker    Quit date: 02/13/2001  . Smokeless tobacco: Never Used  . Alcohol Use: No  . Drug Use: Not on file  . Sexually Active: No   Other Topics Concern  . Not on file   Social History Narrative  . No narrative on file     BP 108/56  Pulse 72  Ht 6' (1.829 m)  Physical Exam:  ill appearing 78 year old man, NAD HEENT: Unremarkable Neck:  7 cm JVD, no thyromegally Lungs:  Clear with no wheezes, rhonchi, but basilar rales are present. HEART:  Regular rate rhythm, no murmurs, no rubs, no clicks Abd:  soft, positive bowel sounds, no organomegally, no rebound, no guarding Ext:  2 plus pulses, no  edema, no cyanosis, no clubbing Skin:  No rashes no nodules Neuro:  CN II through XII intact, motor grossly intact  DEVICE  Normal device function.  See PaceArt for details.   Assess/Plan:

## 2011-11-19 NOTE — Interval H&P Note (Signed)
History and Physical Interval Note:  11/19/2011 8:44 AM  Troy Cook  has presented today for surgery, with the diagnosis of CHF Afib  The various methods of treatment have been discussed with the patient and family. After consideration of risks, benefits and other options for treatment, the patient has consented to  Procedure(s) (LRB): IMPLANTABLE CARDIOVERTER DEFIBRILLATOR IMPLANT (N/A) as a surgical intervention .  The patients' history has been reviewed, patient examined, no change in status, stable for surgery.  I have reviewed the patients' chart and labs.  Questions were answered to the patient's satisfaction.     Lewayne Bunting

## 2011-11-19 NOTE — Op Note (Signed)
ICD implant via the left cephalic vein without immediate complication.Z#[id number].

## 2011-11-19 NOTE — Op Note (Signed)
NAMEMarland Kitchen  MENELIK, MCFARREN NO.:  [id number]  MEDICAL RECORD NO.:  [id number]  LOCATION:  3733                         FACILITY:  [facility]  PHYSICIAN:  Doylene Canning. Ladona Ridgel, MD    DATE OF BIRTH:  [birth year]  DATE OF PROCEDURE:  11/19/2011 DATE OF DISCHARGE:                              OPERATIVE REPORT   PROCEDURE PERFORMED:  Insertion of a single-chamber defibrillator.  INDICATION:  Longstanding ischemic cardiomyopathy with severe left ventricular dysfunction and ejection fraction 30%.  INTRODUCTION:  The patient is a 78 year old man who is status post MI over 10 years ago.  He was found to have an occluded LAD which could not be crossed successfully.  He had anterior scar.  His ejection fraction was 30% despite maximal medical therapy, and he is now referred for ICD implantation.  He has class III congestive heart failure.  PROCEDURE:  After informed consent was obtained, the patient was taken to the diagnostic EP lab in a fasting state.  After usual preparation and draping, intravenous fentanyl and midazolam was given for sedation. A 30 mL of lidocaine was infiltrated into the left infraclavicular region.  A 7 cm incision was carried out over this region. Electrocautery was utilized to dissect down to the fascial plane.  The cephalic vein was isolated without particular difficulty.  It was cannulated and the [employer] SG active fixation single coil defibrillation lead, serial [id number] was advanced by way of the cephalic vein into the right ventricle.  Mapping was carried out at the final site.  The R-waves measured 12 mV.  The pacing impedance was 600 ohms, threshold 0.5 V at 0.5 msec.  A 10 V pacing did not stimulate the diaphragm.  With the defibrillation lead in satisfactory position, it was secured to the subpectoral fascia with a figure-of-eight silk suture.  The sewing sleeve was secured with silk suture.  Electrocautery was utilized to  make subcutaneous pocket.  Antibiotic irrigation was utilized to irrigate the pocket.  Electrocautery was utilized to assure hemostasis.  The [employer] single-chamber defibrillator, serial [id number] was connected to the defibrillation lead and placed back in the subcutaneous pocket.  The generator was secured with silk suture. The pocket was irrigated with antibiotic irrigation and the incision was closed with 2-0 and 3-0 Vicryl.  A pressure dressing was placed after benzoin and Steri-Strips were placed on the incision.  At this point, I scrubbed out the case to supervise defibrillation threshold testing.  After the patient was more deeply sedated with fentanyl and Versed, VF was induced with a T-wave shock.  A 17 joule shock was delivered which terminated ventricular fibrillation, restoring atrial fibrillation.  At this point, the patient was returned to his room in satisfactory condition.  COMPLICATIONS:  There were no immediate procedure complications.  RESULTS:  This demonstrate successful implantation of a [employer] single coil single-chamber defibrillator in a patient with a longstanding ischemic cardiomyopathy, class III heart failure, ejection fraction of 30%.     Doylene Canning. Ladona Ridgel, MD     GWT/MEDQ  D:  11/19/2011  T:  11/19/2011  Job:  [id number]  cc:   Corky Crafts, MD

## 2011-11-19 NOTE — Clinical Social Work Psychosocial (Signed)
     Clinical Social Work Department BRIEF PSYCHOSOCIAL ASSESSMENT 11/19/2011  Patient:  Troy Cook, Troy Cook     Account Number:  [id number]     Admit date:  11/19/2011  Clinical Social Worker:  Doree Albee  Date/Time:  11/19/2011 03:48 PM  Referred by:  RN  Date Referred:  11/19/2011 Referred for  SNF Placement   Other Referral:   Interview type:  Patient Other interview type:   and pt wife    PSYCHOSOCIAL DATA Living Status:  FACILITY Admitted from facility:  [facility] Level of care:  Independent Living Primary support name:  Emelio Schneller Primary support relationship to patient:  SPOUSE Degree of support available:   strong    CURRENT CONCERNS Current Concerns  Post-Acute Placement   Other Concerns:    SOCIAL WORK ASSESSMENT / PLAN CSW met with pt and pt spouse at bedside to discuss pt discharge plans. Pt and pt spouse had planned for pt to return to [facility] when medically stable to the skilled nursing section for a few days for extra assistance.    Per discussion with pt and pt spouse, this was elected to provide pt with extra care needed as pt spouse is not able to physically provide all pt needs.    CSW also ocnfirmed with [facility] of pt discharge plans. [facility] agreed to ensure pt and pt spouse understood pt stay at snf would be private pay if patient does not have 3 night qualifying stay. Per discussion with pt spouse and pt, pt is anticipating to discharge tomorrow if medically stable.    Please see placement note for placement progress.   Assessment/plan status:  Psychosocial Support/Ongoing Assessment of Needs Other assessment/ plan:   and discharge planning   Information/referral to community resources:   no resources idenditifed    PATIENTS/FAMILYS RESPONSE TO PLAN OF CARE: Pt and pt spouse appreciated csw concern and support. Pt is motivated to discharge for extra care at [facility]  snf when medically stable. Pt stated, "I'm glad you cam to say hello and help with this process."

## 2011-11-20 ENCOUNTER — Telehealth: Payer: Self-pay | Admitting: Internal Medicine

## 2011-11-20 ENCOUNTER — Ambulatory Visit (HOSPITAL_COMMUNITY): Payer: Medicare Other

## 2011-11-20 LAB — PROTIME-INR
INR: 2.21 — ABNORMAL HIGH (ref 0.00–1.49)
Prothrombin Time: 24.9 seconds — ABNORMAL HIGH (ref 11.6–15.2)

## 2011-11-20 MED ORDER — METOPROLOL TARTRATE 25 MG PO TABS: 25.0000 mg | ORAL_TABLET | Freq: Two times a day (BID) | ORAL | Status: DC

## 2011-11-20 MED ORDER — METOPROLOL TARTRATE 25 MG PO TABS
25.0000 mg | ORAL_TABLET | Freq: Two times a day (BID) | ORAL | Status: DC
  Administered 2011-11-20: 25 mg via ORAL
  Filled 2011-11-20 (×2): qty 1

## 2011-11-20 MED ORDER — CARVEDILOL 12.5 MG PO TABS
12.5000 mg | ORAL_TABLET | Freq: Two times a day (BID) | ORAL | Status: DC
  Filled 2011-11-20 (×2): qty 1

## 2011-11-20 MED ORDER — CARVEDILOL 12.5 MG PO TABS: 12.5000 mg | ORAL_TABLET | Freq: Two times a day (BID) | ORAL | Status: DC

## 2011-11-20 NOTE — Progress Notes (Signed)
Ambulating patient from the bathroom to the chair by walker. HR went up to 190's Pt stated "Ouch, it went off." Pt sat down on the chair and monitors called, strip printed off. MD notified. Pacemaker interrogator called by MD. Will monitor.

## 2011-11-20 NOTE — Progress Notes (Signed)
25 metoprolol given at 1242, Pt's HR before ambulation in 80-90's. Got pt up ambulated to bathroom and and back and forth in the room three times. Pt tolerated well pts HR went up to 115 at the max. No complaints or SOB.

## 2011-11-20 NOTE — Progress Notes (Signed)
SUBJECTIVE:  Had AFib with RVR that led to AICD firing.   OBJECTIVE:   Vitals:   Filed Vitals:   11/19/11 1600 11/19/11 1700 11/19/11 2142 11/20/11 0621  BP: 103/54 103/56 123/78 139/98  Pulse: 93 95 98 110  Temp:   97.7 F (36.5 C) 98 F (36.7 C)  TempSrc:   Oral Oral  Resp:   18 20  Height:      Weight:      SpO2:   94% 91%   I&O's:  No intake or output data in the 24 hours ending 11/20/11 1316 TELEMETRY: Reviewed telemetry pt in AFib, HR 110:     PHYSICAL EXAM General: Well developed, well nourished, in no acute distress Head: Eyes PERRLA, No xanthomas.   Normal cephalic and atramatic  Lungs:   Clear bilaterally to auscultation and percussion. Heart:  Irregularly irregular Abdomen: Bowel sounds are positive, abdomen soft and non-tender without masses or                  Hernia's noted. Msk:  Back normal, normal gait. Normal strength and tone for age. Extremities:  Bilateral edema Neuro: Alert and oriented X 3. Psych:  Good affect, responds appropriately   LABS: Basic Metabolic Panel: No results found for this basename: NA:2,K:2,CL:2,CO2:2,GLUCOSE:2,BUN:2,CREATININE:2,CALCIUM:2,MG:2,PHOS:2 in the last 72 hours Liver Function Tests: No results found for this basename: AST:2,ALT:2,ALKPHOS:2,BILITOT:2,PROT:2,ALBUMIN:2 in the last 72 hours No results found for this basename: LIPASE:2,AMYLASE:2 in the last 72 hours CBC: No results found for this basename: WBC:2,NEUTROABS:2,HGB:2,HCT:2,MCV:2,PLT:2 in the last 72 hours Cardiac Enzymes: No results found for this basename: CKTOTAL:3,CKMB:3,CKMBINDEX:3,TROPONINI:3 in the last 72 hours BNP: No components found with this basename: POCBNP:3 D-Dimer: No results found for this basename: DDIMER:2 in the last 72 hours Hemoglobin A1C: No results found for this basename: HGBA1C in the last 72 hours Fasting Lipid Panel: No results found for this basename: CHOL,HDL,LDLCALC,TRIG,CHOLHDL,LDLDIRECT in the last 72 hours Thyroid  Function Tests: No results found for this basename: TSH,T4TOTAL,FREET3,T3FREE,THYROIDAB in the last 72 hours Anemia Panel: No results found for this basename: VITAMINB12,FOLATE,FERRITIN,TIBC,IRON,RETICCTPCT in the last 72 hours Coag Panel:   Lab Results  Component Value Date   INR 2.21* 11/20/2011   INR 2.14* 11/19/2011   INR 2.4* 11/12/2011    RADIOLOGY: Dg Chest 2 View  11/20/2011  *RADIOLOGY REPORT*  Clinical Data: Chest and arm pain.  Status post AICD placement.  CHEST - 2 VIEW  Comparison: 10/26/2011  Findings: A new AICD is seen in appropriate position, with a single lead in the right ventricle.  No evidence of pneumothorax.  Decreased interstitial edema is seen since previous study.  Mild atelectasis seen at the left lung base.  No evidence of pulmonary consolidation or pleural effusion.  Heart size is mildly enlarged is stable.  IMPRESSION:  1.  New AICD in appropriate position.  No evidence of pneumothorax. 2.  Decreased interstitial edema. 2.  Stable mild cardiomegaly and left basilar atelectasis.  Original Report Authenticated By: Danae Orleans, M.D.   Dg Chest 2 View  10/26/2011  *RADIOLOGY REPORT*  Clinical Data: Shortness breath with dizziness.  History of diabetes.  CHEST - 2 VIEW  Comparison: 10/26/2011 and reports from remote examinations prior to 2002.  Findings: The heart size and mediastinal contours are stable.  Low lung volumes and diffuse interstitial prominence are similar to the available prior examination from earlier today. No focal airspace disease or significant pleural effusion is identified. Cholecystectomy clips and bilateral glenohumeral degenerative changes are noted.  IMPRESSION: Suspected chronic interstitial lung disease.  No definite acute superimposed process.  Original Report Authenticated By: Gerrianne Scale, M.D.   Ct Angio Chest W/cm &/or Wo Cm  10/26/2011  *RADIOLOGY REPORT*  Clinical Data: History of CHF, now with low oxygen saturation shortness of breath,  evaluate for pulmonary embolism  CT ANGIOGRAPHY CHEST  Technique:  Multidetector CT imaging of the chest using the standard protocol during bolus administration of intravenous contrast. Multiplanar reconstructed images including MIPs were obtained and reviewed to evaluate the vascular anatomy.  Contrast: OMNIPAQUE IOHEXOL 300 MG/ML  SOLN  Comparison: Chest radiograph - 10/26/2011  Findings:  There is adequate opacification of the pulmonary vasculature with the main pulmonary artery measuring 250 HU.  No discrete filling defects within the pulmonary vasculature to suggest acute pulmonary embolism.  Caliber of the main pulmonary artery is enlarged measuring 3.5 cm in diameter.  Cardiomegaly with apparent enlargement of the right atrium.  No pericardial effusion.  Coronary artery calcifications.  Normal caliber of the thoracic aorta.  Scattered atherosclerotic calcifications within a normal caliber thoracic aorta.  Normal configuration of the aortic arch.  No definite periaortic stranding.  Nonvascular findings:  Small bilateral pleural effusions, right greater than left. Bibasilar dependent opacities, right greater than left. Calcifications within the bilateral bronchi.  Mild air trapping is suspected.  No pneumothorax.  Nonspecific borderline mediastinal adenopathy with index prevascular lymph node measuring 1 cm in short axis diameter (image 35, series four) and index precarinal lymph node measuring 1.2 cm in short axis diameter (image 38).  No definite hilar or axillary lymphadenopathy.  Early arterial phase evaluation of the upper abdomen demonstrates a large ventral abdominal wall containing a portion of nondilated loop of transverse colon, incompletely imaged.  Post cholecystectomy.  Extensive vascular calcifications within the splenic artery.  No acute or aggressive osseous abnormalities.  Thoracic spine degenerative change  IMPRESSION:  1.  Negative for pulmonary embolism.  2.  Overall findings  compatible with pulmonary edema with small bilateral effusions and bibasilar opacities, right greater than left, atelectasis versus infiltrate.  3.  Apparent enlargement of the right atrium and main pulmonary artery, nonspecific though may be seen in the setting of pulmonary arterial hypertension.  Further evaluation with cardiac echo may be performed as clinically indicated.  4. Nonspecific mediastinal lymphadenopathy, possibly reactive in etiology.  Original Report Authenticated By: Waynard Reeds, M.D.      ASSESSMENT: AFib, ischemic cardiomyopathy s/p AICD.  PLAN:  At home, prior to recent CHF admit, patient was on Digoxin and Coreg 25 mg BID, with HR in the 50-60 range.  Meds were stopped due to CHF and bradycardia.  Now that AICD is in place, will restart Coreg at 12.5 mg BID.  BP should tolerate.  D/C Metoprolol.  HR increases with minimal activity.  Patient already has Coreg 25 mg tablets at home.  May need to increase based on BP and HR.  Continue Lasix as well.  We discussed his urinary difficulties.  I think it is important for him to stay on this to avoid CHF.  Corky Crafts., MD  11/20/2011  1:16 PM

## 2011-11-20 NOTE — Progress Notes (Signed)
Pt discharged by wheelchair to [facility]iece to [facility] section. Pt stable.

## 2011-11-20 NOTE — Telephone Encounter (Signed)
New problem:  Per Rose,  1.  need clarification on recent discharge orders.  2.  Who should the  nurse contacted if patient should have any issues over the weekend regarding his pacemaker.

## 2011-11-20 NOTE — Progress Notes (Signed)
   ELECTROPHYSIOLOGY ROUNDING NOTE    Patient Name: Troy Cook Date of Encounter: 11-20-2011    SUBJECTIVE: Patient status post single chamber ICD implant 11-19-2011.  No chest pain or shortness of breath.  Mild incisional soreness.    TELEMETRY: Reviewed telemetry pt in atrial fibrillation with PVC's Filed Vitals:   11/19/11 1600 11/19/11 1700 11/19/11 2142 11/20/11 0621  BP: 103/54 103/56 123/78 139/98  Pulse: 93 95 98 110  Temp:   97.7 F (36.5 C) 98 F (36.7 C)  TempSrc:   Oral Oral  Resp:   18 20  Height:      Weight:      SpO2:   94% 91%   Radiology/Studies:  Dg Chest 2 View 11/20/2011  *RADIOLOGY REPORT*  Clinical Data: Chest and arm pain.  Status post AICD placement.  CHEST - 2 VIEW  Comparison: 10/26/2011  Findings: A new AICD is seen in appropriate position, with a single lead in the right ventricle.  No evidence of pneumothorax.  Decreased interstitial edema is seen since previous study.  Mild atelectasis seen at the left lung base.  No evidence of pulmonary consolidation or pleural effusion.  Heart size is mildly enlarged is stable.  IMPRESSION:  1.  New AICD in appropriate position.  No evidence of pneumothorax. 2.  Decreased interstitial edema. 2.  Stable mild cardiomegaly and left basilar atelectasis.  Original Report Authenticated By: Danae Orleans, M.D.   PHYSICAL EXAM Left chest without hematoma or ecchymosis.  CV - IRIR Lungs - clear bilaterally Ext - minimal edema   DEVICE INTERROGATION: Device interrogated by industry.  Lead values including impedence, sensing, threshold within normal values.    Assessment/Impression/Plan  1. S/p ICD implant 2. ICM 3. Chronic atrial fib Rec: will discharge with usual followup. No change in medical therapy.  Leonia Reeves.D.

## 2011-11-20 NOTE — Discharge Instructions (Addendum)
   Supplemental Discharge Instructions for  Pacemaker/Defibrillator Patients  Activity No heavy lifting or vigorous activity with your left/right arm for 6 to 8 weeks.  Do not raise your left/right arm above your head for one week.  Gradually raise your affected arm as drawn below.           06/09                       06/10                       06/11                      06/12       NO DRIVING for 1 week; you may begin driving on 16/03/9603. WOUND CARE   Keep the wound area clean and dry.  Do not get this area wet for one week. No showers for one week; you may shower on 11/27/2011.   The tape/steri-strips on your wound will fall off; do not pull them off.  No bandage is needed on the site.  DO  NOT apply any creams, oils, or ointments to the wound area.   If you notice any drainage or discharge from the wound, any swelling or bruising at the site, or you develop a fever > 101? F after you are discharged home, call the office at once.  Special Instructions   You are still able to use cellular telephones; use the ear opposite the side where you have your pacemaker/defibrillator.  Avoid carrying your cellular phone near your device.   When traveling through airports, show security personnel your identification card to avoid being screened in the metal detectors.  Ask the security personnel to use the hand wand.   Avoid arc welding equipment, MRI testing (magnetic resonance imaging), TENS units (transcutaneous nerve stimulators).  Call the office for questions about other devices.   Avoid electrical appliances that are in poor condition or are not properly grounded.   Microwave ovens are safe to be near or to operate.  Additional information for defibrillator patients should your device go off:   If your device goes off ONCE and you feel fine afterward, notify the device clinic nurses.   If your device goes off ONCE and you do not feel well afterward, call 911.   If your device goes off  TWICE, call 911.   If your device goes off THREE times in one day, call 911.  DO NOT DRIVE YOURSELF OR A FAMILY MEMBER WITH A DEFIBRILLATOR TO THE HOSPITAL--CALL 911.

## 2011-11-20 NOTE — Progress Notes (Signed)
Clinical social worker spoke with pt facility regarding possible delay in discharge. Per discussion with pt nurse, there was an concern regarding pt heart rate. Facility stated that they will allow pt to transfer if medically stable, no later than 3pm, however with strong reservations regarding risk for readmission.   .Clinical social worker continuing to follow pt to assist with pt dc plans and further csw needs.   Catha Gosselin, LCSWA  [phone] 11/20/2011 1149am

## 2011-11-20 NOTE — Discharge Summary (Signed)
ELECTROPHYSIOLOGY PROCEDURE DISCHARGE SUMMARY    Patient ID: Troy Cook,  MRN: [id number], DOB/AGE: 76-Mar-1933 76 y.o.  Admit date: 11/19/2011 Discharge date: 11/20/2011  Primary Care Physician: Beverley Fiedler, MD Primary Cardiologist: Everette Rank, MD Electrophysiologist: Lewayne Bunting, MD  Primary Discharge Diagnosis:  Ischemic cardiomyopathy status post ICD implantation this admission.   Secondary Discharge Diagnosis:  1.  Atrial fibrillation 2.  Chronic anticoagulation with Warfarin- followed by [facility] 3.  Diabetes 4.  Hypertension 5.  Hyperlipidemia 6.  Coronary artery disease- chronic total occlusion of LAD diagnosed in 2005.  Procedures This Admission: 1.  Implantation of a single chamber ICD on 11-19-2011 by Dr Ladona Ridgel.  The patient received a BSX Energen ICD with Endotak Reliance RV lead.  DFT's at time of implant were successful at 17J.  There were no early immediate complications. 2.  Chest x-ray on 11-20-2011 demonstrated no pneumothorax status post device implantation.   Brief HPI: Troy Cook is a 78 year old male referred to Dr Ladona Ridgel in the outpatient setting by Dr. Eldridge Dace for consideration for prophylactic ICD implantation. The patient has a long-standing history of an ischemic cardiomyopathy and worsening congestive heart failure. His ejection fraction is 30-35% by echo despite maximal medical therapy. 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 Attempt to cross the lesion were unsuccessful. The patient has had chronic atrial fibrillation for over 10 years. He has not had syncope. Over the last several weeks, he has had worsening shortness of breath, peripheral edema, and volume overload. He has not had syncope. His weight is improved now that he has been undergoing diuresis initially with intravenous Lasix and now with oral Lasix.  Hospital Course:  The patient was admitted and underwent implantation of a single  chamber ICD with details as outlined above.   He was monitored on telemetry overnight which demonstrated atrial fibrillation with occasional PVC's.  Left chest was without hematoma or ecchymosis.  The device was interrogated and found to be functioning normally.  CXR was obtained and demonstrated no pneumothorax status post device implantation.  Wound care, arm mobility, and restrictions were reviewed with the patient.  Dr Ladona Ridgel examined the patient and considered them stable for discharge to home.  Per the patient's and wife's request, the patient will be discharge to the skilled nursing facility at [facility] for a few days and then transition back to independent living.    Discharge Vitals: Blood pressure 139/98, pulse 110, temperature 98 F (36.7 C), temperature source Oral, resp. rate 20, height 5\' 10"  (1.778 m), weight 227 lb (102.967 kg), SpO2 91.00%.    Labs:   Lab Results  Component Value Date   WBC 9.4 11/12/2011   HGB 10.8* 11/12/2011   HCT 33.7* 11/12/2011   MCV 91.1 11/12/2011   PLT 259.0 11/12/2011    Discharge Medications:  Medication List  As of 11/20/2011  8:53 AM   TAKE these medications         aspirin 81 MG tablet   Take 81 mg by mouth daily.      ferrous sulfate 325 (65 FE) MG tablet   Take 325 mg by mouth daily with breakfast.      furosemide 20 MG tablet   Commonly known as: LASIX   Take 3 tablets (60 mg total) by mouth daily.      glipiZIDE 10 MG 24 hr tablet   Commonly known as: GLUCOTROL XL   Take 10  mg by mouth 2 (two) times daily.      isosorbide mononitrate 30 MG 24 hr tablet   Commonly known as: IMDUR   Take 30 mg by mouth daily.      metFORMIN 500 MG tablet   Commonly known as: GLUCOPHAGE   Take 1,000 mg by mouth 2 (two) times daily.      ramipril 10 MG capsule   Commonly known as: ALTACE   Take 10 mg by mouth 2 (two) times daily.      RAPAFLO 8 MG Caps capsule   Generic drug: silodosin   Take 8 mg by mouth daily with breakfast.       simvastatin 20 MG tablet   Commonly known as: ZOCOR   Take 20 mg by mouth every evening.      warfarin 5 MG tablet   Commonly known as: COUMADIN   Take 5 mg by mouth See admin instructions. Pt takes 1 and 1/2 tablets 7.5 mg daily except, takes 10 mg 2 tablets on Monday and Friday            Disposition:  Discharge Orders    Future Appointments: Provider: Department: Dept Phone: Center:   11/30/2011 4:00 PM [facility] [phone] [facility]   02/19/2012 11:45 AM Marinus Maw, MD [facility] [phone] [facility]     Future Orders Please Complete By Expires   Diet - low sodium heart healthy      Increase activity slowly      Discharge instructions      Comments:   Please see post ICD discharge instructions.     Follow-up Information    Follow up with [facility] on 11/30/2011. (At 4:00 PM for wound check)    Contact information:   [address] [place] [address]       Follow up with Lewayne Bunting, MD on 02/19/2012. (At 11:45 AM)    Contact information:   [address]  [address] [place] [address] [phone]       Follow up with Corky Crafts., MD on 12/02/2011. (For lab - coumadin follow-up (INR check))    Contact information:   [address] [place] [address] [phone]          Duration of Discharge Encounter: Greater than 30 minutes including physician time.  Signed, Gypsy Balsam, RN, BSN 11/20/2011, 8:53 AM  I have seen, examined the patient, and reviewed the above assessment and plan.  In addition, prior to discharge, he developed afib with RVR.  He received an inappropriate ICD shocks for this.  Upon discussion with Dr Ladona Ridgel, metoprolol 25mg  BID was added.  In addition, his device was adjusted to add a VT zone from 200-240 bpm with Rhythm ID and 15 second duration of detection.  VF is 240bpm+ with a  5 second  duration.  Upon discussion with the patient and his spouse, they would like to proceed with discharge later today.  We will ambulate the patient and assess this afternoon.  If no further concerns, he can discharge today.  If any concerns arise, we will have a low threshold to keep him another day.  Co Sign: Hillis Range, MD 11/20/2011 11:11 AM

## 2011-11-20 NOTE — Telephone Encounter (Signed)
I spoke with Rose and verified the pt's discharge medications based on discharge summary (including Metoprolol Rx).  The pt lives at [facility] in independent living but due to just being discharged he will go to the assisted living over the weekend.  I made Rose aware that if they have any concerns about the pt's ICD then they can contact our office.  She also asked about who will be managing the pt's coumadin. I made her aware that the pt's primary cardiologist Dr Eldridge Dace should be managing this medication.

## 2011-11-23 ENCOUNTER — Telehealth: Payer: Self-pay | Admitting: Cardiology

## 2011-11-23 NOTE — Telephone Encounter (Signed)
New Problem:    Called in wondering if the patient was supposed to be taking carvedilol (COREG) 12.5 MG tablet and metoprolol.  Please call back.

## 2011-11-24 NOTE — Telephone Encounter (Signed)
Dr Eldridge Dace saw patient after Metoprolol was sent.  Patient was previously on Coreg 12.5mg  twice daily.  After discussion between Dr Eldridge Dace and Dr Johney Frame, plan to resume Coreg 12.5mg  1 tablet twice daily instead of Metoprolol.  Pharmacy aware.

## 2011-11-30 ENCOUNTER — Ambulatory Visit (INDEPENDENT_AMBULATORY_CARE_PROVIDER_SITE_OTHER): Payer: Medicare Other | Admitting: *Deleted

## 2011-11-30 ENCOUNTER — Encounter: Payer: Self-pay | Admitting: Internal Medicine

## 2011-11-30 DIAGNOSIS — I428 Other cardiomyopathies: Secondary | ICD-10-CM

## 2011-11-30 DIAGNOSIS — I4891 Unspecified atrial fibrillation: Secondary | ICD-10-CM

## 2011-11-30 LAB — ICD DEVICE OBSERVATION
BRDY-0002RV: 40 {beats}/min
DEV-0020ICD: NEGATIVE
DEVICE MODEL ICD: 119029
VENTRICULAR PACING ICD: 1 pct

## 2011-11-30 NOTE — Progress Notes (Signed)
Wound check defib  

## 2012-02-19 ENCOUNTER — Ambulatory Visit (INDEPENDENT_AMBULATORY_CARE_PROVIDER_SITE_OTHER): Payer: Medicare Other | Admitting: Internal Medicine

## 2012-02-19 ENCOUNTER — Encounter: Payer: Self-pay | Admitting: Internal Medicine

## 2012-02-19 VITALS — BP 106/54 | HR 80 | Ht 71.0 in | Wt 232.8 lb

## 2012-02-19 DIAGNOSIS — I5022 Chronic systolic (congestive) heart failure: Secondary | ICD-10-CM | POA: Insufficient documentation

## 2012-02-19 DIAGNOSIS — I4891 Unspecified atrial fibrillation: Secondary | ICD-10-CM

## 2012-02-19 DIAGNOSIS — I509 Heart failure, unspecified: Secondary | ICD-10-CM

## 2012-02-19 DIAGNOSIS — Z95810 Presence of automatic (implantable) cardiac defibrillator: Secondary | ICD-10-CM | POA: Insufficient documentation

## 2012-02-19 LAB — ICD DEVICE OBSERVATION
BRDY-0002RV: 40 {beats}/min
RV LEAD AMPLITUDE: 22.8 mv
TZON-0003FASTVT: 300 ms
VENTRICULAR PACING ICD: 0 pct

## 2012-02-19 NOTE — Assessment & Plan Note (Signed)
His device is working normally. He's had no ICD discharges. He is asymptomatic. We'll recheck in several months.

## 2012-02-19 NOTE — Progress Notes (Signed)
HPI [name] returns today for followup. He is a 78 year old man with a long-standing ischemic cardiomyopathy, chronic systolic heart failure, diabetes, and hypertension. He is status post ICD implantation. In the interim, he has been stable. He denies chest pain, shortness of breath, or syncope. He does admit to weakness. No Known Allergies   Current Outpatient Prescriptions  Medication Sig Dispense Refill  . aspirin 81 MG tablet Take 81 mg by mouth daily.      . carvedilol (COREG) 12.5 MG tablet Take 1 tablet (12.5 mg total) by mouth 2 (two) times daily with a meal.  60 tablet  6  . ferrous sulfate 325 (65 FE) MG tablet Take 325 mg by mouth daily with breakfast.      . furosemide (LASIX) 20 MG tablet Take 3 tablets (60 mg total) by mouth daily.  90 tablet  1  . glipiZIDE (GLUCOTROL XL) 10 MG 24 hr tablet Take 10 mg by mouth 2 (two) times daily.      . isosorbide mononitrate (IMDUR) 30 MG 24 hr tablet Take 30 mg by mouth daily.      . metFORMIN (GLUCOPHAGE) 500 MG tablet Take 1,000 mg by mouth 2 (two) times daily.      . ramipril (ALTACE) 10 MG capsule Take 10 mg by mouth 2 (two) times daily.      . silodosin (RAPAFLO) 8 MG CAPS capsule Take 8 mg by mouth daily with breakfast.      . simvastatin (ZOCOR) 20 MG tablet Take 20 mg by mouth every evening.      . warfarin (COUMADIN) 5 MG tablet Take 5 mg by mouth See admin instructions. Pt takes 1 and 1/2 tablets 7.5 mg daily except, takes 10 mg 2 tablets on Monday and Friday         Past Medical History  Diagnosis Date  . Atrial fibrillation   . Diabetes mellitus   . Hypertension   . Coronary artery disease   . Hyperlipemia   . ICD (implantable cardiac defibrillator) in place   . CHF (congestive heart failure)     ROS:   All systems reviewed and negative except as noted in the HPI.   Past Surgical History  Procedure Date  . Cholecystectomy   . Cardiac defibrillator placement 11/19/2011    single chamber  . Cardiac  catheterization   . Cystectomy      No family history on file.   History   Social History  . Marital Status: Married    Spouse Name: N/A    Number of Children: N/A  . Years of Education: N/A   Occupational History  . Not on file.   Social History Main Topics  . Smoking status: Former Smoker    Quit date: 02/13/2001  . Smokeless tobacco: Never Used  . Alcohol Use: No  . Drug Use: No  . Sexually Active: No   Other Topics Concern  . Not on file   Social History Narrative  . No narrative on file     BP 106/54  Pulse 80  Ht 5\' 11"  (1.803 m)  Wt 232 lb 12.8 oz (105.597 kg)  BMI 32.47 kg/m2  Physical Exam:  Well appearing 78 year-old man, NAD HEENT: Unremarkable Neck:  No JVD, no thyromegally Lymphatics:  No adenopathy Back:  No CVA tenderness Lungs:  Clear with no wheezes, rales, or rhonchi. HEART:  Regular rate rhythm, no murmurs, no rubs, no clicks Abd:  soft, positive bowel sounds, no organomegally, no  rebound, no guarding Ext:  2 plus pulses, no edema, no cyanosis, no clubbing Skin:  No rashes no nodules Neuro:  CN II through XII intact, motor grossly intact  DEVICE  Normal device function.  See PaceArt for details.   Assess/Plan:

## 2012-02-19 NOTE — Patient Instructions (Addendum)
Your physician wants you to follow-up in: June 2014 You will receive a reminder letter in the mail two months in advance. If you don't receive a letter, please call our office to schedule the follow-up appointment.   Remote monitoring is used to monitor your Pacemaker of ICD from home. This monitoring reduces the number of office visits required to check your device to one time per year. It allows [place] to keep an eye on the functioning of your device to ensure it is working properly. You are scheduled for a device check from home on 05/26/12. You may send your transmission at any time that day. If you have a wireless device, the transmission will be sent automatically. After your physician reviews your transmission, you will receive a postcard with your next transmission date.

## 2012-02-19 NOTE — Assessment & Plan Note (Signed)
His chronic systolic heart failure is well compensated. He will continue his current medical therapy and maintain a low-sodium diet. I've encouraged the patient to increase his physical activity.

## 2012-02-19 NOTE — Assessment & Plan Note (Signed)
He's had no recurrent episodes since discharge. No change in medical therapy today.

## 2012-03-16 ENCOUNTER — Telehealth: Payer: Self-pay | Admitting: Internal Medicine

## 2012-03-16 NOTE — Telephone Encounter (Signed)
Spoke w/pt in regards to transmissions. Pt has wireless ICD and had questions about [employer]. All questions answered.

## 2012-03-16 NOTE — Telephone Encounter (Signed)
plz return call to patient at [phone] regarding questions about remote transmission

## 2012-05-26 ENCOUNTER — Encounter: Payer: Self-pay | Admitting: Internal Medicine

## 2012-05-26 ENCOUNTER — Ambulatory Visit (INDEPENDENT_AMBULATORY_CARE_PROVIDER_SITE_OTHER): Payer: Medicare Other | Admitting: *Deleted

## 2012-05-26 DIAGNOSIS — Z95810 Presence of automatic (implantable) cardiac defibrillator: Secondary | ICD-10-CM

## 2012-05-26 DIAGNOSIS — I5022 Chronic systolic (congestive) heart failure: Secondary | ICD-10-CM

## 2012-05-26 LAB — REMOTE ICD DEVICE
CHARGE TIME: 9 s
DEV-0020ICD: NEGATIVE
FVT: 0
PACEART VT: 0
RV LEAD AMPLITUDE: 25 mv
TOT-0006: 20130906000000
TZAT-0001FASTVT: 1
TZAT-0013FASTVT: 2
TZON-0003FASTVT: 300 ms
TZST-0001FASTVT: 3
TZST-0001FASTVT: 6
TZST-0001FASTVT: 7
TZST-0003FASTVT: 41 J
TZST-0003FASTVT: 41 J
VENTRICULAR PACING ICD: 0 pct

## 2012-06-14 ENCOUNTER — Other Ambulatory Visit: Payer: Self-pay | Admitting: *Deleted

## 2012-06-14 MED ORDER — CARVEDILOL 12.5 MG PO TABS: 12.5000 mg | ORAL_TABLET | Freq: Two times a day (BID) | ORAL | Status: DC

## 2012-07-21 ENCOUNTER — Emergency Department (HOSPITAL_COMMUNITY)
Admission: EM | Admit: 2012-07-21 | Discharge: 2012-07-21 | Disposition: A | Discharge status: Home / Self Care | Payer: PRIVATE HEALTH INSURANCE | Attending: Emergency Medicine | Admitting: Emergency Medicine

## 2012-07-21 ENCOUNTER — Encounter (HOSPITAL_COMMUNITY): Payer: Self-pay | Admitting: *Deleted

## 2012-07-21 DIAGNOSIS — Z87891 Personal history of nicotine dependence: Secondary | ICD-10-CM | POA: Insufficient documentation

## 2012-07-21 DIAGNOSIS — N390 Urinary tract infection, site not specified: Secondary | ICD-10-CM | POA: Insufficient documentation

## 2012-07-21 DIAGNOSIS — Z79899 Other long term (current) drug therapy: Secondary | ICD-10-CM | POA: Insufficient documentation

## 2012-07-21 DIAGNOSIS — I2510 Atherosclerotic heart disease of native coronary artery without angina pectoris: Secondary | ICD-10-CM | POA: Insufficient documentation

## 2012-07-21 DIAGNOSIS — I509 Heart failure, unspecified: Secondary | ICD-10-CM | POA: Insufficient documentation

## 2012-07-21 DIAGNOSIS — Z7901 Long term (current) use of anticoagulants: Secondary | ICD-10-CM | POA: Insufficient documentation

## 2012-07-21 DIAGNOSIS — I4891 Unspecified atrial fibrillation: Secondary | ICD-10-CM | POA: Insufficient documentation

## 2012-07-21 DIAGNOSIS — N471 Phimosis: Secondary | ICD-10-CM | POA: Insufficient documentation

## 2012-07-21 DIAGNOSIS — R300 Dysuria: Secondary | ICD-10-CM | POA: Insufficient documentation

## 2012-07-21 DIAGNOSIS — Z7982 Long term (current) use of aspirin: Secondary | ICD-10-CM | POA: Insufficient documentation

## 2012-07-21 DIAGNOSIS — Z95810 Presence of automatic (implantable) cardiac defibrillator: Secondary | ICD-10-CM | POA: Insufficient documentation

## 2012-07-21 DIAGNOSIS — R350 Frequency of micturition: Secondary | ICD-10-CM | POA: Insufficient documentation

## 2012-07-21 DIAGNOSIS — E119 Type 2 diabetes mellitus without complications: Secondary | ICD-10-CM | POA: Insufficient documentation

## 2012-07-21 DIAGNOSIS — M5380 Other specified dorsopathies, site unspecified: Secondary | ICD-10-CM | POA: Insufficient documentation

## 2012-07-21 DIAGNOSIS — R319 Hematuria, unspecified: Secondary | ICD-10-CM | POA: Insufficient documentation

## 2012-07-21 DIAGNOSIS — M62830 Muscle spasm of back: Secondary | ICD-10-CM

## 2012-07-21 DIAGNOSIS — E785 Hyperlipidemia, unspecified: Secondary | ICD-10-CM | POA: Insufficient documentation

## 2012-07-21 DIAGNOSIS — I10 Essential (primary) hypertension: Secondary | ICD-10-CM | POA: Insufficient documentation

## 2012-07-21 DIAGNOSIS — R3915 Urgency of urination: Secondary | ICD-10-CM | POA: Insufficient documentation

## 2012-07-21 LAB — URINE MICROSCOPIC-ADD ON

## 2012-07-21 LAB — URINALYSIS, ROUTINE W REFLEX MICROSCOPIC
Glucose, UA: 250 mg/dL — AB
Specific Gravity, Urine: 1.02 (ref 1.005–1.030)
Urobilinogen, UA: 1 mg/dL (ref 0.0–1.0)

## 2012-07-21 MED ORDER — METHOCARBAMOL 500 MG PO TABS: 500.0000 mg | ORAL_TABLET | Freq: Three times a day (TID) | ORAL | Status: DC

## 2012-07-21 MED ORDER — CIPROFLOXACIN HCL 500 MG PO TABS: 500.0000 mg | ORAL_TABLET | Freq: Two times a day (BID) | ORAL | Status: DC

## 2012-07-21 MED ORDER — METHOCARBAMOL 500 MG PO TABS
500.0000 mg | ORAL_TABLET | Freq: Once | ORAL | Status: AC
  Administered 2012-07-21: 500 mg via ORAL
  Filled 2012-07-21: qty 1

## 2012-07-21 MED ORDER — TRAMADOL HCL 50 MG PO TABS: ORAL_TABLET | ORAL | Status: DC

## 2012-07-21 MED ORDER — CIPROFLOXACIN HCL 500 MG PO TABS
500.0000 mg | ORAL_TABLET | Freq: Once | ORAL | Status: AC
  Administered 2012-07-21: 500 mg via ORAL
  Filled 2012-07-21: qty 1

## 2012-07-21 NOTE — ED Notes (Signed)
Bed:WA02<BR> Expected date:<BR> Expected time:<BR> Means of arrival:<BR> Comments:<BR> EMS

## 2012-07-21 NOTE — ED Provider Notes (Signed)
History     CSN: [id number]  Arrival date & time 07/21/12  1844   First MD Initiated Contact with Patient 07/21/12 1858      Chief Complaint  Patient presents with  . Hematuria    (Consider location/radiation/quality/duration/timing/severity/associated sxs/prior treatment) HPI Patient reports he gets urinary tract infections every couple years. He states he is normally treated with Cipro which works very well for him. He states last night he started having pain with urination, hematuria, frequency, and urgency. He denies fever, nausea, vomiting, abdominal pain, or flank pain. He is not sure of his passing clots or not. He states this morning he took  AZO over the counter without relief.  Patient also states in the 70s he had bad pain in his back and was having problems and was out of work for about 3 months. He states he started getting "a catch" in his back today. He states he also has been laying in bed all day because his bed is close to the bathroom.  Patient states he just had his yearly physical one week ago.   PCP Dr Tenny Craw  Past Medical History  Diagnosis Date  . Atrial fibrillation   . Diabetes mellitus   . Hypertension   . Coronary artery disease   . Hyperlipemia   . ICD (implantable cardiac defibrillator) in place   . CHF (congestive heart failure)     Past Surgical History  Procedure Date  . Cholecystectomy   . Cardiac defibrillator placement 11/19/2011    single chamber  . Cardiac catheterization   . Cystectomy     History reviewed. No pertinent family history.  History  Substance Use Topics  . Smoking status: Former Smoker    Quit date: 02/13/2001  . Smokeless tobacco: Never Used  . Alcohol Use: No   Lives at home Lives with spouse  Review of Systems  All other systems reviewed and are negative.    Allergies  Review of patient's allergies indicates no known allergies.  Home Medications   Current Outpatient Rx  Name  Route  Sig  Dispense   Refill  . ASPIRIN 81 MG PO TABS   Oral   Take 81 mg by mouth daily.         Marland Kitchen CARVEDILOL 12.5 MG PO TABS   Oral   Take 1 tablet (12.5 mg total) by mouth 2 (two) times daily with a meal.   60 tablet   6   . CIPROFLOXACIN HCL 500 MG PO TABS   Oral   Take 1 tablet (500 mg total) by mouth 2 (two) times daily.   28 tablet   0   . FERROUS SULFATE 325 (65 FE) MG PO TABS   Oral   Take 325 mg by mouth daily with breakfast.         . FUROSEMIDE 20 MG PO TABS   Oral   Take 3 tablets (60 mg total) by mouth daily.   90 tablet   1   . GLIPIZIDE ER 10 MG PO TB24   Oral   Take 10 mg by mouth 2 (two) times daily.         . ISOSORBIDE MONONITRATE ER 30 MG PO TB24   Oral   Take 30 mg by mouth daily.         Marland Kitchen METFORMIN HCL 500 MG PO TABS   Oral   Take 1,000 mg by mouth 2 (two) times daily.         Marland Kitchen  METHOCARBAMOL 500 MG PO TABS   Oral   Take 1 tablet (500 mg total) by mouth 3 (three) times daily.   30 tablet   0   . RAMIPRIL 10 MG PO CAPS   Oral   Take 10 mg by mouth 2 (two) times daily.         Marland Kitchen SILODOSIN 8 MG PO CAPS   Oral   Take 8 mg by mouth daily with breakfast.         . SIMVASTATIN 20 MG PO TABS   Oral   Take 20 mg by mouth every evening.         Marland Kitchen TRAMADOL HCL 50 MG PO TABS      Take 1 or 2 po Q 6hrs for pain   16 tablet   0   . WARFARIN SODIUM 5 MG PO TABS   Oral   Take 5 mg by mouth See admin instructions. Pt takes 1 and 1/2 tablets 7.5 mg daily except, takes 10 mg 2 tablets on Monday and Friday           BP 124/68  Pulse 112  Temp 98.2 F (36.8 C) (Oral)  Resp 20  SpO2 97%  Vital signs normal except tachycardia   Physical Exam  Nursing note and vitals reviewed. Constitutional: He is oriented to person, place, and time. He appears well-developed and well-nourished.  Non-toxic appearance. He does not appear ill. No distress.  HENT:  Head: Normocephalic and atraumatic.  Right Ear: External ear normal.  Left Ear: External  ear normal.  Nose: Nose normal. No mucosal edema or rhinorrhea.  Mouth/Throat: Oropharynx is clear and moist and mucous membranes are normal. No dental abscesses or uvula swelling.  Eyes: Conjunctivae normal and EOM are normal. Pupils are equal, round, and reactive to light.  Neck: Normal range of motion and full passive range of motion without pain. Neck supple.  Cardiovascular: Normal rate, regular rhythm and normal heart sounds.  Exam reveals no gallop and no friction rub.   No murmur heard. Pulmonary/Chest: Effort normal and breath sounds normal. No respiratory distress. He has no wheezes. He has no rhonchi. He has no rales. He exhibits no tenderness and no crepitus.  Abdominal: Soft. Normal appearance and bowel sounds are normal. He exhibits no distension. There is no tenderness. There is no rebound and no guarding.  Genitourinary:       phimosis  Musculoskeletal: Normal range of motion. He exhibits no edema and no tenderness.       Moves all extremities well. Pt c/o muscle tenderness in his lower back  Neurological: He is alert and oriented to person, place, and time. He has normal strength. No cranial nerve deficit.  Skin: Skin is warm, dry and intact. No rash noted. No erythema. No pallor.  Psychiatric: He has a normal mood and affect. His speech is normal and behavior is normal. His mood appears not anxious.    ED Course  Procedures (including critical care time)   Medications  methocarbamol (ROBAXIN) tablet 500 mg (500 mg Oral Given 07/21/12 2022)  ciprofloxacin (CIPRO) tablet 500 mg (500 mg Oral Given 07/21/12 2215)   Bladder scan was "0"  Pt states cipro is what he is given for his UTI's in the past and it has helped.    Results for orders placed during the hospital encounter of 07/21/12  URINALYSIS, ROUTINE W REFLEX MICROSCOPIC      Component Value Range   Color, Urine RED (*) YELLOW  APPearance TURBID (*) CLEAR   Specific Gravity, Urine 1.020  1.005 - 1.030   pH 5.5   5.0 - 8.0   Glucose, UA 250 (*) NEGATIVE mg/dL   Hgb urine dipstick LARGE (*) NEGATIVE   Bilirubin Urine LARGE (*) NEGATIVE   Ketones, ur 40 (*) NEGATIVE mg/dL   Protein, ur >409 (*) NEGATIVE mg/dL   Urobilinogen, UA 1.0  0.0 - 1.0 mg/dL   Nitrite POSITIVE (*) NEGATIVE   Leukocytes, UA LARGE (*) NEGATIVE  URINE MICROSCOPIC-ADD ON      Component Value Range   WBC, UA TOO NUMEROUS TO COUNT  <3 WBC/hpf   RBC / HPF TOO NUMEROUS TO COUNT  <3 RBC/hpf   Bacteria, UA MANY (*) RARE   Urine-Other FIELD OBSCURED BY WBC'S     Laboratory interpretation all normal except UTI    1. Hematuria   2. Phimosis   3. UTI (lower urinary tract infection)   4. Muscle spasm of back     New Prescriptions   CIPROFLOXACIN (CIPRO) 500 MG TABLET    Take 1 tablet (500 mg total) by mouth 2 (two) times daily.   METHOCARBAMOL (ROBAXIN) 500 MG TABLET    Take 1 tablet (500 mg total) by mouth 3 (three) times daily.   TRAMADOL (ULTRAM) 50 MG TABLET    Take 1 or 2 po Q 6hrs for pain    Plan discharge  Devoria Albe, MD, Armando Gang    MDM          Ward Givens, MD 07/21/12 2234

## 2012-07-21 NOTE — ED Notes (Signed)
Bladder scan showed 0mL.  

## 2012-07-21 NOTE — ED Notes (Signed)
Pt arrives from home by [facility] with c/o hematuria with hx of same. Pt also c/o back spasms. Pt A&Ox's4.

## 2012-07-22 ENCOUNTER — Telehealth (HOSPITAL_COMMUNITY): Payer: Self-pay | Admitting: *Deleted

## 2012-07-23 ENCOUNTER — Telehealth (HOSPITAL_COMMUNITY): Payer: Self-pay | Admitting: Emergency Medicine

## 2012-07-23 LAB — URINE CULTURE

## 2012-07-23 NOTE — ED Notes (Signed)
[facility] called regarding Urine culture. Urine culture still pending.

## 2012-07-24 ENCOUNTER — Telehealth (HOSPITAL_COMMUNITY): Payer: Self-pay | Admitting: Emergency Medicine

## 2012-07-24 NOTE — ED Notes (Signed)
Patient had +Urine culture. Checking to see if appropriate treatment was given. °

## 2012-07-24 NOTE — ED Notes (Signed)
+  Urine. Patient treated with Cipro. Sensitive to same. Per protocol MD. °

## 2012-07-25 ENCOUNTER — Telehealth (HOSPITAL_COMMUNITY): Payer: Self-pay | Admitting: Emergency Medicine

## 2012-08-31 ENCOUNTER — Non-Acute Institutional Stay (SKILLED_NURSING_FACILITY): Payer: PRIVATE HEALTH INSURANCE | Admitting: Nurse Practitioner

## 2012-08-31 DIAGNOSIS — I959 Hypotension, unspecified: Secondary | ICD-10-CM

## 2012-08-31 DIAGNOSIS — M25519 Pain in unspecified shoulder: Secondary | ICD-10-CM

## 2012-08-31 DIAGNOSIS — E871 Hypo-osmolality and hyponatremia: Secondary | ICD-10-CM

## 2012-08-31 DIAGNOSIS — M25562 Pain in left knee: Secondary | ICD-10-CM

## 2012-08-31 DIAGNOSIS — M25569 Pain in unspecified knee: Secondary | ICD-10-CM

## 2012-08-31 DIAGNOSIS — M25511 Pain in right shoulder: Secondary | ICD-10-CM

## 2012-08-31 MED ORDER — CELECOXIB 200 MG PO CAPS: 200.0000 mg | ORAL_CAPSULE | Freq: Two times a day (BID) | ORAL | Status: DC

## 2012-08-31 MED ORDER — LISINOPRIL 20 MG PO TABS: 20.0000 mg | ORAL_TABLET | Freq: Every day | ORAL | Status: DC

## 2012-08-31 NOTE — Progress Notes (Signed)
Subjective:     Patient ID: Troy Cook, male   DOB: [birth year], 79 y.o.   MRN: [id number]  HPI  08/31/12:  Hyponatremia: Serum sodium 133  Chronic neck and shoulders pain, usually left is worse than the right, but now complains of more pain in the right shoulder. Meloxicam worked but caused anemia which stopped subsequently.   On 08/29/12 the xray of right shoulder showed moderate osteoarthritis.   Knee pain is chronic and improved shortly after steroid injection in the knees and have not relapsed.   Hypotension: Multiple anti-hypertensive agents.   Hyponatremia: after Lasix 60mg  held and oral fluid encouraged.  ARD: improved after Lasix 60mg  held, oral hydration encouraged, Bun/creat improved from 68/1.90 08/18/12 to 51/1.33 08/22/12. BNP: 267.5 08/18/12, Lasix resumed at 20mg  daily 08/22/12, f/u BMP on 08/24/12 Anemia: worse, has been on Fe, 9.76 08/18/12, Meloxicam held to reduce possible GI side effect, no further c/o neck pain presently. Anemia panel ordered.  REASSESSMENT OF ONGOING PROBLEMS:   250.00-DM, UNCOMPLICATED TYPE II  apparently his fasting CBG in am has been in 130-140 since Metformin decreased to 500mg  bid since 08/19/12 401.9-HTN UNSPECIFIED  controlled.  428.22-CHRONIC SYSTOLIC HEART FAILURE  clinically compensating since Lasix 60mg  held 08/18/12--resumed at lower dose of 20mg  08/22/12 since Na normalized and Bun/creat improved.  599.0-UTI  recurrent with MRSA, currently treated with Septra DS-stopped 08/22/12 due to elevated serum K 5.7 and started Nitrofurantoin for total 7 days. The sensitivities for this organism show broad sensitivity to many antibiotics. He is on isolation for now. 707.05-PRESSURE ULCER, BUTTOCK   Huge sacral decubitus ulcer that has enlarged. His initial evaluation showed 3 ulcers  in the area of a remotely done pilonidal cyst repair. I anticipated that the tissue destruction would be significant and involve a larger area than was immediately obvious--currently the  wound bed is debride with Santyl and wound treatment with Wound Vac.  719.46-PAIN IN JOINT, KNEE   bilateral knee discomfort and swelling in the left knee.Both knees have crepitance and stiffness. There are no inflammatory changes. Discomfort improved substantially since the steroid injections done in each knee. 276.7-HYPERKALEMIA    new, serum K 5.7 08/22/12-stopped Septra ? culprit, Kayexalate 15gm x2 on 08/22/12, f/u BMP 08/24/12     Review of Systems  Constitutional: Negative for activity change, appetite change and fatigue.  HENT: Positive for hearing loss, neck pain and voice change.   Respiratory: Negative for cough, chest tightness, shortness of breath and wheezing.   Cardiovascular: Negative for leg swelling.  Gastrointestinal: Negative for abdominal pain.  Endocrine: Negative for cold intolerance and heat intolerance.  Genitourinary: Positive for frequency.  Musculoskeletal: Positive for back pain, arthralgias and gait problem.  Neurological: Negative for tremors and speech difficulty.  Hematological: Negative for adenopathy.  Psychiatric/Behavioral: Positive for confusion. Negative for behavioral problems. The patient is nervous/anxious.        Objective:   Physical Exam  Skin:     Sacral pressure wound, stage IV, size of a golf ball and x3 in depth. Wound bed is covered with thin layer of yellow/whitish/black(may come from Wound vac foam). Treated with Santyl and wound Vac        Assessment:     Right shoulder pain: worse in right shoulder, xray showed moderate osteoarthritis.  Knee Pain: relapsed  Hypotension: 98/56 Hyponatremia: mild - 133    Plan:     Right shoulder pain: Celebrex 200mg  daily PO, no substitute Knee Pain: celebrex should relieve the pain, injection worked  but can only use up to 3-4x/year Hypotension: hold lisinopril  Hyponatremia: observe and monitor. Check BMP in 2 weeks.

## 2012-09-01 ENCOUNTER — Telehealth: Payer: Self-pay | Admitting: Internal Medicine

## 2012-09-01 ENCOUNTER — Non-Acute Institutional Stay (SKILLED_NURSING_FACILITY): Payer: PRIVATE HEALTH INSURANCE | Admitting: Nurse Practitioner

## 2012-09-01 ENCOUNTER — Encounter: Payer: Self-pay | Admitting: Nurse Practitioner

## 2012-09-01 DIAGNOSIS — E871 Hypo-osmolality and hyponatremia: Secondary | ICD-10-CM

## 2012-09-01 DIAGNOSIS — N390 Urinary tract infection, site not specified: Secondary | ICD-10-CM

## 2012-09-01 DIAGNOSIS — F4323 Adjustment disorder with mixed anxiety and depressed mood: Secondary | ICD-10-CM

## 2012-09-01 MED ORDER — DULOXETINE HCL 30 MG PO CPEP: 30.0000 mg | ORAL_CAPSULE | Freq: Every day | ORAL | Status: DC

## 2012-09-01 MED ORDER — SULFAMETHOXAZOLE-TRIMETHOPRIM 800-160 MG PO TABS: 1.0000 | ORAL_TABLET | Freq: Two times a day (BID) | ORAL | Status: DC

## 2012-09-01 NOTE — Progress Notes (Signed)
Subjective:     Patient ID: Troy Cook, male   DOB: December 28, 1931, 79 y.o.   MRN: [id number]  HPI Urine culture 09/01/12 MRSA >100,000c/ml, susceptible to Trimeth/Sulfa Hyponatremia: resolved after Lasix 60mg  held and oral fluid encouraged.  ARD: improved after Lasix 60mg  held, oral hydration encouraged, Bun/creat improved from 68/1.90 08/18/12 to 51/1.33 08/22/12. BNP: 267.5 08/18/12, Lasix resumed at 20mg  daily 08/22/12, f/u BMP on 08/24/12 Anemia: worse, has been on Fe, 9.76 08/18/12, Meloxicam held to reduce possible GI side effect, no further c/o neck pain presently. Anemia panel ordered.  REASSESSMENT OF ONGOING PROBLEMS:   250.00-DM, UNCOMPLICATED TYPE II  apparently his fasting CBG in am has been in 130-140 since Metformin decreased to 500mg  bid since 08/19/12 401.9-HTN UNSPECIFIED  controlled.  428.22-CHRONIC SYSTOLIC HEART FAILURE  clinically compensating since Lasix 60mg  held 08/18/12--resumed at lower dose of 20mg  08/22/12 since Na normalized and Bun/creat improved.  599.0-UTI  recurrent with MRSA, currently treated with Septra DS-stopped 08/22/12 due to elevated serum K 5.7 and started Nitrofurantoin for total 7 days. The sensitivities for this organism show broad sensitivity to many antibiotics. He is on isolation for now. 707.05-PRESSURE ULCER, BUTTOCK   Huge sacral decubitus ulcer that has enlarged. His initial evaluation showed 3 ulcers  in the area of a remotely done pilonidal cyst repair. I anticipated that the tissue destruction would be significant and involve a larger area than was immediately obvious--currently the wound bed is debride with Santyl and wound treatment with Wound Vac.  719.46-PAIN IN JOINT, KNEE   bilateral knee discomfort and swelling in the left knee.Both knees have crepitance and stiffness. There are no inflammatory changes. Discomfort improved substantially since the steroid injections done in each knee. 276.7-HYPERKALEMIA    new, serum K 5.7 08/22/12-stopped Septra ? culprit,  Kayexalate 15gm x2 on 08/22/12, f/u BMP 08/24/12     Review of Systems  Constitutional: Positive for fatigue. Negative for activity change and appetite change.  HENT: Negative.   Eyes: Negative.   Respiratory: Negative.   Cardiovascular: Positive for leg swelling.  Endocrine: Negative.   Genitourinary: Positive for dysuria, urgency and frequency.  Musculoskeletal: Positive for arthralgias and gait problem.  Allergic/Immunologic: Negative.   Neurological: Negative.   Hematological: Bruises/bleeds easily.  Psychiatric/Behavioral: Positive for behavioral problems.       Objective:   Physical Exam  Constitutional: He appears well-developed and well-nourished.  HENT:  Head: Normocephalic and atraumatic.  Eyes: Conjunctivae and EOM are normal. Pupils are equal, round, and reactive to light.  Neck: Normal range of motion. Neck supple. No JVD present. No thyromegaly present.  Cardiovascular: Normal rate and intact distal pulses.   Murmur heard. Pulmonary/Chest: Effort normal and breath sounds normal. He has no wheezes. He has no rales.  Abdominal: Soft. Bowel sounds are normal.  Genitourinary: Rectum normal.  Musculoskeletal:       Right shoulder: He exhibits decreased range of motion.       Left shoulder: He exhibits decreased range of motion and tenderness.       Right knee: Tenderness found.       Left knee: Tenderness found.  Lymphadenopathy:    He has no cervical adenopathy.  Skin:     Pressure ulcer, stage IV, wound vac and Santyl, black necrotic tissue seen diffused and in small areas from edge of wound to wound bed       Assessment:     UTI: recurrent, organism is MRSA, will treat with Septra DS bid for 3 weeks  and f/u Cath UA and C/S upon completion of ABT along with Florastor Depression: Cymbalta 30mg  daily for now to stabilize mood and neck/shoulder/knee pain.  Knee pain: X-ray 08/31/12 showed no evidence of acute fracture. Moderate osteoarthritic degenerative  changes are noted--Celebrex for now.

## 2012-09-01 NOTE — Telephone Encounter (Signed)
New Problem:    Called in wanting to speak with you about receiving that patient transmission.  Please call back.

## 2012-09-02 NOTE — Telephone Encounter (Signed)
Left message for RN @ [facility] that transmission was received and patient is scheduled to be seen in office 09/14/12.

## 2012-09-05 ENCOUNTER — Encounter: Payer: Self-pay | Admitting: Internal Medicine

## 2012-09-05 ENCOUNTER — Non-Acute Institutional Stay (SKILLED_NURSING_FACILITY): Payer: PRIVATE HEALTH INSURANCE | Admitting: Nurse Practitioner

## 2012-09-05 ENCOUNTER — Other Ambulatory Visit: Payer: Self-pay | Admitting: Internal Medicine

## 2012-09-05 DIAGNOSIS — M25511 Pain in right shoulder: Secondary | ICD-10-CM

## 2012-09-05 DIAGNOSIS — L8990 Pressure ulcer of unspecified site, unspecified stage: Secondary | ICD-10-CM

## 2012-09-05 DIAGNOSIS — N390 Urinary tract infection, site not specified: Secondary | ICD-10-CM | POA: Insufficient documentation

## 2012-09-05 DIAGNOSIS — L8994 Pressure ulcer of unspecified site, stage 4: Secondary | ICD-10-CM | POA: Insufficient documentation

## 2012-09-05 DIAGNOSIS — M25519 Pain in unspecified shoulder: Secondary | ICD-10-CM

## 2012-09-05 LAB — REMOTE ICD DEVICE
BRDY-0002RV: 40 {beats}/min
DEVICE MODEL ICD: 119029
HV IMPEDENCE: 90 Ohm
RV LEAD AMPLITUDE: 9.7 mv
TZAT-0001FASTVT: 1
TZAT-0013FASTVT: 2
TZAT-0018FASTVT: NEGATIVE
TZAT-0018FASTVT: NEGATIVE
TZST-0001FASTVT: 3
TZST-0001FASTVT: 4
TZST-0001FASTVT: 7
TZST-0001FASTVT: 8
TZST-0003FASTVT: 41 J
TZST-0003FASTVT: 41 J
VENTRICULAR PACING ICD: 0 pct

## 2012-09-05 NOTE — Progress Notes (Signed)
Subjective:     Patient ID: Troy Cook, male   DOB: 09-06-1931, 79 y.o.   MRN: [id number]  HPI   09/05/12 I was requested to evaluate sacral wound: enlarged and foul odor, wound culture is unremarkable, periosteum seen which is tightly attached to sacrum, the opposite wound bed seems granulating/angiogenesis sign. Now question is Santyl and Wound Vac will be still appropriate treatment. Wound Vac consultant presented during today's evaluation and agreed to hold both for 1 week, the reevaluate.            Urine culture 09/01/12 MRSA >100,000c/ml, Septra DS started subsequently per curine culture.  Hyponatremia: resolved after Lasix 60mg  held and oral fluid encouraged.  ARD: improved after Lasix 60mg  held, oral hydration encouraged, Bun/creat improved from 68/1.90 08/18/12 to 51/1.33 08/22/12. BNP: 267.5 08/18/12, Lasix resumed at 20mg  daily 08/22/12, f/u BMP on 08/24/12 Anemia: worse, has been on Fe, 9.76 08/18/12, Meloxicam held to reduce possible GI side effect, no further c/o neck pain presently. Anemia panel ordered.  REASSESSMENT OF ONGOING PROBLEMS:   250.00-DM, UNCOMPLICATED TYPE II  apparently his fasting CBG in am has been in 130-140 since Metformin decreased to 500mg  bid since 08/19/12 401.9-HTN UNSPECIFIED  controlled.  428.22-CHRONIC SYSTOLIC HEART FAILURE  clinically compensating since Lasix 60mg  held 08/18/12--resumed at lower dose of 20mg  08/22/12 since Na normalized and Bun/creat improved.  599.0-UTI  recurrent with MRSA, currently treated with Septra DS-stopped 08/22/12 due to elevated serum K 5.7 and started Nitrofurantoin for total 7 days. The sensitivities for this organism show broad sensitivity to many antibiotics. He is on isolation for now. 707.05-PRESSURE ULCER, BUTTOCK   Huge sacral decubitus ulcer that has enlarged. His initial evaluation showed 3 ulcers  in the area of a remotely done pilonidal cyst repair. I anticipated that the tissue destruction would be significant and involve a larger  area than was immediately obvious--currently the wound bed is debride with Santyl and wound treatment with Wound Vac.  719.46-PAIN IN JOINT, KNEE   bilateral knee discomfort and swelling in the left knee.Both knees have crepitance and stiffness. There are no inflammatory changes. Discomfort improved substantially since the steroid injections done in each knee. 276.7-HYPERKALEMIA    new, serum K 5.7 08/22/12-stopped Septra ? culprit, Kayexalate 15gm x2 on 08/22/12, f/u BMP 08/24/12     Review of Systems  Constitutional: Positive for fatigue. Negative for activity change and appetite change.  HENT: Negative.   Eyes: Negative.   Respiratory: Negative.   Cardiovascular: Positive for leg swelling.  Gastrointestinal: Negative.   Endocrine: Negative.   Genitourinary: Positive for dysuria, urgency and frequency.  Musculoskeletal: Positive for arthralgias and gait problem.  Skin: Positive for wound. Rash: sacral presssure ulcer stage IV.  Allergic/Immunologic: Negative.   Neurological: Negative.   Hematological: Bruises/bleeds easily.  Psychiatric/Behavioral: Positive for behavioral problems.       Objective:   Physical Exam  Constitutional: He appears well-developed and well-nourished.  HENT:  Head: Normocephalic and atraumatic.  Eyes: Conjunctivae and EOM are normal. Pupils are equal, round, and reactive to light.  Neck: Normal range of motion. Neck supple. No JVD present. No thyromegaly present.  Cardiovascular: Normal rate and intact distal pulses.   Murmur heard. Pulmonary/Chest: Effort normal and breath sounds normal. He has no wheezes. He has no rales.  Abdominal: Soft. Bowel sounds are normal.  Genitourinary: Rectum normal.  Musculoskeletal:       Right shoulder: He exhibits decreased range of motion.       Left shoulder:  He exhibits decreased range of motion and tenderness.       Right knee: Tenderness found.       Left knee: Tenderness found.  Lymphadenopathy:    He has no  cervical adenopathy.  Skin:     Pressure ulcer, stage IV, wound vac and Santyl, black necrotic tissue seen diffused and in small areas from edge of wound to wound bed-almost cleared--My opinion these were nonviable tissue. Now saw large area of periosteum attached to sacrum tightly       Assessment:   Congestive heart failure compensated on Furosemide 20mg    . Normocytic anemia stable, Hgb 10s  . Hypertension controlled.   . Atrial fibrillation rate controoled.   . ICD (implantable cardiac defibrillator) in place functional.  . Chronic systolic heart failure improved.   . Pressure ulcer stage IV hold Santyl and Wound Vac, try wet to dry packing bid and re-evaluate in one week.   Marland Kitchen UTI (urinary tract infection) continue Septra DS

## 2012-09-08 ENCOUNTER — Non-Acute Institutional Stay (SKILLED_NURSING_FACILITY): Payer: PRIVATE HEALTH INSURANCE | Admitting: Nurse Practitioner

## 2012-09-08 DIAGNOSIS — N390 Urinary tract infection, site not specified: Secondary | ICD-10-CM

## 2012-09-08 DIAGNOSIS — E875 Hyperkalemia: Secondary | ICD-10-CM

## 2012-09-08 MED ORDER — DOXYCYCLINE HYCLATE 100 MG PO TABS: 100.0000 mg | ORAL_TABLET | Freq: Two times a day (BID) | ORAL | Status: DC

## 2012-09-08 MED ORDER — SODIUM POLYSTYRENE SULFONATE PO POWD: ORAL | Status: DC

## 2012-09-08 NOTE — Progress Notes (Signed)
Subjective:     Patient ID: Troy Cook, male   DOB: [birth year], 79 y.o.   MRN: [id number]  HPI  09/08/12 Na 132, K 6.7, currently taking Septra DS for MRSA UTI-will switch to Doxycycline as indicated on urine culture.   09/05/12 I was requested to evaluate sacral wound: enlarged and foul odor, wound culture is unremarkable, periosteum seen which is tightly attached to sacrum, the opposite wound bed seems granulating/angiogenesis sign. Now question is Santyl and Wound Vac will be still appropriate treatment. Wound Vac consultant presented during today's evaluation and agreed to hold both for 1 week, the reevaluate.            Urine culture 09/01/12 MRSA >100,000c/ml, Septra DS started subsequently per curine culture.    09/05/12 I was requested to evaluate sacral wound: enlarged and foul odor, wound culture is unremarkable, periosteum seen which is tightly attached to sacrum, the opposite wound bed seems granulating/angiogenesis sign. Now question is Santyl and Wound Vac will be still appropriate treatment. Wound Vac consultant presented during today's evaluation and agreed to hold both for 1 week, the reevaluate. 09/07/12 wound bed granulated except periosteum area.         Urine culture 09/01/12 MRSA >100,000c/ml, Septra DS started subsequently per curine culture.  Hyponatremia: resolved after Lasix 60mg  held and oral fluid encouraged.  ARD: improved after Lasix 60mg  held, oral hydration encouraged, Bun/creat improved from 68/1.90 08/18/12 to 51/1.33 08/22/12. BNP: 267.5 08/18/12, Lasix resumed at 20mg  daily 08/22/12, f/u BMP on 08/24/12 Anemia: worse, has been on Fe, 9.76 08/18/12, Meloxicam held to reduce possible GI side effect, no further c/o neck pain presently. Anemia panel ordered.  REASSESSMENT OF ONGOING PROBLEMS:   250.00-DM, UNCOMPLICATED TYPE II  apparently his fasting CBG in am has been in 130-140 since Metformin decreased to 500mg  bid since 08/19/12 401.9-HTN UNSPECIFIED  controlled.  428.22-CHRONIC  SYSTOLIC HEART FAILURE  clinically compensating since Lasix 60mg  held 08/18/12--resumed at lower dose of 20mg  08/22/12 since Na normalized and Bun/creat improved.  599.0-UTI  recurrent with MRSA, currently treated with Septra DS-stopped 08/22/12 due to elevated serum K 5.7 and started Nitrofurantoin for total 7 days. The sensitivities for this organism show broad sensitivity to many antibiotics. He is on isolation for now. 707.05-PRESSURE ULCER, BUTTOCK   Huge sacral decubitus ulcer that has enlarged. His initial evaluation showed 3 ulcers  in the area of a remotely done pilonidal cyst repair. I anticipated that the tissue destruction would be significant and involve a larger area than was immediately obvious--currently the wound bed is debride with Santyl and wound treatment with Wound Vac.  719.46-PAIN IN JOINT, KNEE   bilateral knee discomfort and swelling in the left knee.Both knees have crepitance and stiffness. There are no inflammatory changes. Discomfort improved substantially since the steroid injections done in each knee. 276.7-HYPERKALEMIA    new, serum K 5.7 08/22/12-stopped Septra ? culprit, Kayexalate 15gm x2 on 08/22/12, f/u BMP 08/24/12. Again on Septra, serum K 6.7 09/08/12--Kayexalate 30gm, f/u BMP 09/09/12    Review of Systems  Constitutional: Positive for fatigue. Negative for activity change and appetite change.  HENT: Negative.   Eyes: Negative.   Respiratory: Negative.   Cardiovascular: Positive for leg swelling.  Gastrointestinal: Negative.   Endocrine: Negative.   Genitourinary: Positive for dysuria, urgency and frequency.  Musculoskeletal: Positive for arthralgias and gait problem.  Skin: Positive for wound. Rash: sacral presssure ulcer stage IV.  Allergic/Immunologic: Negative.   Neurological: Negative.   Hematological: Bruises/bleeds easily.  Psychiatric/Behavioral: Positive for behavioral problems.       Objective:   Physical Exam  Constitutional: He appears  well-developed and well-nourished.  HENT:  Head: Normocephalic and atraumatic.  Eyes: Conjunctivae and EOM are normal. Pupils are equal, round, and reactive to light.  Neck: Normal range of motion. Neck supple. No JVD present. No thyromegaly present.  Cardiovascular: Normal rate and intact distal pulses.   Murmur heard. Pulmonary/Chest: Effort normal and breath sounds normal. He has no wheezes. He has no rales.  Abdominal: Soft. Bowel sounds are normal.  Genitourinary: Rectum normal.  Musculoskeletal:       Right shoulder: He exhibits decreased range of motion.       Left shoulder: He exhibits decreased range of motion and tenderness.       Right knee: Tenderness found.       Left knee: Tenderness found.  Lymphadenopathy:    He has no cervical adenopathy.  Skin:     Pressure ulcer, stage IV, wound vac and Santyl, black necrotic tissue seen diffused and in small areas from edge of wound to wound bed-almost cleared--My opinion these were nonviable tissue. Now saw large area of periosteum attached to sacrum tightly       Assessment:   Congestive heart failure compensated on Furosemide 20mg    . Normocytic anemia stable, Hgb 10s  . Hypertension controlled.   . Atrial fibrillation rate controoled.   . ICD (implantable cardiac defibrillator) in place functional.  . Chronic systolic heart failure improved.   . Pressure ulcer stage IV hold Santyl and Wound Vac, try wet to dry packing bid and re-evaluate in one week.   Marland Kitchen UTI (urinary tract infection) dc Septra since this may contribute to elevated serum K, try Doxycyline 100mg  bid for 2 weeks.   Hyperkalemia kayexalate 30gm x1, BMP in am

## 2012-09-09 ENCOUNTER — Encounter: Payer: Self-pay | Admitting: *Deleted

## 2012-09-10 ENCOUNTER — Other Ambulatory Visit (HOSPITAL_COMMUNITY): Payer: Medicare Other

## 2012-09-10 ENCOUNTER — Encounter (HOSPITAL_COMMUNITY): Payer: Self-pay

## 2012-09-10 ENCOUNTER — Emergency Department (HOSPITAL_COMMUNITY): Payer: PRIVATE HEALTH INSURANCE

## 2012-09-10 ENCOUNTER — Inpatient Hospital Stay (HOSPITAL_COMMUNITY)
Admission: EM | Admit: 2012-09-10 | Discharge: 2012-09-22 | DRG: 871 | Disposition: A | Discharge status: Skilled Nursing Facility | Payer: PRIVATE HEALTH INSURANCE | Attending: Internal Medicine | Admitting: Internal Medicine

## 2012-09-10 DIAGNOSIS — I509 Heart failure, unspecified: Secondary | ICD-10-CM

## 2012-09-10 DIAGNOSIS — R6521 Severe sepsis with septic shock: Secondary | ICD-10-CM | POA: Diagnosis present

## 2012-09-10 DIAGNOSIS — A419 Sepsis, unspecified organism: Secondary | ICD-10-CM

## 2012-09-10 DIAGNOSIS — D72829 Elevated white blood cell count, unspecified: Secondary | ICD-10-CM | POA: Diagnosis present

## 2012-09-10 DIAGNOSIS — I472 Ventricular tachycardia, unspecified: Secondary | ICD-10-CM | POA: Diagnosis not present

## 2012-09-10 DIAGNOSIS — L8994 Pressure ulcer of unspecified site, stage 4: Secondary | ICD-10-CM

## 2012-09-10 DIAGNOSIS — Y831 Surgical operation with implant of artificial internal device as the cause of abnormal reaction of the patient, or of later complication, without mention of misadventure at the time of the procedure: Secondary | ICD-10-CM | POA: Diagnosis present

## 2012-09-10 DIAGNOSIS — M4628 Osteomyelitis of vertebra, sacral and sacrococcygeal region: Secondary | ICD-10-CM

## 2012-09-10 DIAGNOSIS — K56609 Unspecified intestinal obstruction, unspecified as to partial versus complete obstruction: Secondary | ICD-10-CM

## 2012-09-10 DIAGNOSIS — N179 Acute kidney failure, unspecified: Secondary | ICD-10-CM

## 2012-09-10 DIAGNOSIS — E876 Hypokalemia: Secondary | ICD-10-CM | POA: Diagnosis not present

## 2012-09-10 DIAGNOSIS — N390 Urinary tract infection, site not specified: Secondary | ICD-10-CM

## 2012-09-10 DIAGNOSIS — Z79899 Other long term (current) drug therapy: Secondary | ICD-10-CM

## 2012-09-10 DIAGNOSIS — E119 Type 2 diabetes mellitus without complications: Secondary | ICD-10-CM

## 2012-09-10 DIAGNOSIS — I4729 Other ventricular tachycardia: Secondary | ICD-10-CM | POA: Diagnosis not present

## 2012-09-10 DIAGNOSIS — R0602 Shortness of breath: Secondary | ICD-10-CM

## 2012-09-10 DIAGNOSIS — Z7982 Long term (current) use of aspirin: Secondary | ICD-10-CM

## 2012-09-10 DIAGNOSIS — M545 Low back pain, unspecified: Secondary | ICD-10-CM

## 2012-09-10 DIAGNOSIS — E669 Obesity, unspecified: Secondary | ICD-10-CM | POA: Diagnosis present

## 2012-09-10 DIAGNOSIS — F329 Major depressive disorder, single episode, unspecified: Secondary | ICD-10-CM | POA: Diagnosis present

## 2012-09-10 DIAGNOSIS — T827XXA Infection and inflammatory reaction due to other cardiac and vascular devices, implants and grafts, initial encounter: Secondary | ICD-10-CM | POA: Diagnosis present

## 2012-09-10 DIAGNOSIS — I4891 Unspecified atrial fibrillation: Secondary | ICD-10-CM

## 2012-09-10 DIAGNOSIS — E785 Hyperlipidemia, unspecified: Secondary | ICD-10-CM

## 2012-09-10 DIAGNOSIS — R6520 Severe sepsis without septic shock: Secondary | ICD-10-CM | POA: Diagnosis present

## 2012-09-10 DIAGNOSIS — Z7401 Bed confinement status: Secondary | ICD-10-CM

## 2012-09-10 DIAGNOSIS — A4102 Sepsis due to Methicillin resistant Staphylococcus aureus: Principal | ICD-10-CM

## 2012-09-10 DIAGNOSIS — I2510 Atherosclerotic heart disease of native coronary artery without angina pectoris: Secondary | ICD-10-CM | POA: Diagnosis present

## 2012-09-10 DIAGNOSIS — Z66 Do not resuscitate: Secondary | ICD-10-CM | POA: Diagnosis not present

## 2012-09-10 DIAGNOSIS — K5900 Constipation, unspecified: Secondary | ICD-10-CM

## 2012-09-10 DIAGNOSIS — I5022 Chronic systolic (congestive) heart failure: Secondary | ICD-10-CM

## 2012-09-10 DIAGNOSIS — Z87891 Personal history of nicotine dependence: Secondary | ICD-10-CM

## 2012-09-10 DIAGNOSIS — E875 Hyperkalemia: Secondary | ICD-10-CM

## 2012-09-10 DIAGNOSIS — R197 Diarrhea, unspecified: Secondary | ICD-10-CM | POA: Diagnosis not present

## 2012-09-10 DIAGNOSIS — F411 Generalized anxiety disorder: Secondary | ICD-10-CM | POA: Diagnosis present

## 2012-09-10 DIAGNOSIS — R0600 Dyspnea, unspecified: Secondary | ICD-10-CM

## 2012-09-10 DIAGNOSIS — I10 Essential (primary) hypertension: Secondary | ICD-10-CM

## 2012-09-10 DIAGNOSIS — Z95810 Presence of automatic (implantable) cardiac defibrillator: Secondary | ICD-10-CM | POA: Diagnosis present

## 2012-09-10 DIAGNOSIS — R7881 Bacteremia: Secondary | ICD-10-CM

## 2012-09-10 DIAGNOSIS — D649 Anemia, unspecified: Secondary | ICD-10-CM

## 2012-09-10 DIAGNOSIS — A4902 Methicillin resistant Staphylococcus aureus infection, unspecified site: Secondary | ICD-10-CM

## 2012-09-10 DIAGNOSIS — I2589 Other forms of chronic ischemic heart disease: Secondary | ICD-10-CM | POA: Diagnosis present

## 2012-09-10 DIAGNOSIS — F3289 Other specified depressive episodes: Secondary | ICD-10-CM | POA: Diagnosis present

## 2012-09-10 DIAGNOSIS — L89109 Pressure ulcer of unspecified part of back, unspecified stage: Secondary | ICD-10-CM | POA: Diagnosis present

## 2012-09-10 DIAGNOSIS — M25519 Pain in unspecified shoulder: Secondary | ICD-10-CM

## 2012-09-10 DIAGNOSIS — Z7901 Long term (current) use of anticoagulants: Secondary | ICD-10-CM

## 2012-09-10 DIAGNOSIS — Z515 Encounter for palliative care: Secondary | ICD-10-CM

## 2012-09-10 DIAGNOSIS — E871 Hypo-osmolality and hyponatremia: Secondary | ICD-10-CM | POA: Diagnosis present

## 2012-09-10 HISTORY — DX: Sepsis, unspecified organism: A41.9

## 2012-09-10 LAB — URINE MICROSCOPIC-ADD ON

## 2012-09-10 LAB — CBC WITH DIFFERENTIAL/PLATELET
Basophils Absolute: 0 10*3/uL (ref 0.0–0.1)
Basophils Relative: 0 % (ref 0–1)
Eosinophils Relative: 0 % (ref 0–5)
HCT: 33.4 % — ABNORMAL LOW (ref 39.0–52.0)
Hemoglobin: 11.6 g/dL — ABNORMAL LOW (ref 13.0–17.0)
Lymphocytes Relative: 2 % — ABNORMAL LOW (ref 12–46)
Monocytes Relative: 2 % — ABNORMAL LOW (ref 3–12)
Neutro Abs: 43.6 10*3/uL — ABNORMAL HIGH (ref 1.7–7.7)
RBC: 4.11 MIL/uL — ABNORMAL LOW (ref 4.22–5.81)
WBC: 45.4 10*3/uL — ABNORMAL HIGH (ref 4.0–10.5)

## 2012-09-10 LAB — URINALYSIS, ROUTINE W REFLEX MICROSCOPIC
Glucose, UA: 100 mg/dL — AB
pH: 5.5 (ref 5.0–8.0)

## 2012-09-10 LAB — URINALYSIS, MICROSCOPIC ONLY
Glucose, UA: 250 mg/dL — AB
Specific Gravity, Urine: 1.021 (ref 1.005–1.030)
pH: 5.5 (ref 5.0–8.0)

## 2012-09-10 LAB — PROTIME-INR: INR: 5.66 (ref 0.00–1.49)

## 2012-09-10 LAB — COMPREHENSIVE METABOLIC PANEL
Albumin: 2.1 g/dL — ABNORMAL LOW (ref 3.5–5.2)
Alkaline Phosphatase: 92 U/L (ref 39–117)
BUN: 104 mg/dL — ABNORMAL HIGH (ref 6–23)
Chloride: 95 mEq/L — ABNORMAL LOW (ref 96–112)
Glucose, Bld: 219 mg/dL — ABNORMAL HIGH (ref 70–99)
Potassium: 6 mEq/L — ABNORMAL HIGH (ref 3.5–5.1)
Total Bilirubin: 0.3 mg/dL (ref 0.3–1.2)

## 2012-09-10 LAB — LIPASE, BLOOD: Lipase: 15 U/L (ref 11–59)

## 2012-09-10 LAB — GLUCOSE, CAPILLARY: Glucose-Capillary: 235 mg/dL — ABNORMAL HIGH (ref 70–99)

## 2012-09-10 MED ORDER — VANCOMYCIN HCL 10 G IV SOLR
1500.0000 mg | Freq: Once | INTRAVENOUS | Status: AC
  Administered 2012-09-10: 1500 mg via INTRAVENOUS
  Filled 2012-09-10: qty 1500

## 2012-09-10 MED ORDER — SODIUM CHLORIDE 0.9 % IV SOLN
INTRAVENOUS | Status: DC
  Administered 2012-09-10 – 2012-09-12 (×6): via INTRAVENOUS

## 2012-09-10 MED ORDER — MORPHINE SULFATE 2 MG/ML IJ SOLN
2.0000 mg | INTRAMUSCULAR | Status: DC | PRN
  Administered 2012-09-11 – 2012-09-14 (×4): 2 mg via INTRAVENOUS
  Filled 2012-09-10 (×4): qty 1

## 2012-09-10 MED ORDER — SODIUM CHLORIDE 0.9 % IV SOLN: INTRAVENOUS | Status: AC

## 2012-09-10 MED ORDER — SODIUM CHLORIDE 0.9 % IV BOLUS (SEPSIS)
500.0000 mL | Freq: Once | INTRAVENOUS | Status: AC
  Administered 2012-09-10: 500 mL via INTRAVENOUS

## 2012-09-10 MED ORDER — INSULIN ASPART 100 UNIT/ML IV SOLN
10.0000 [IU] | Freq: Once | INTRAVENOUS | Status: AC
  Administered 2012-09-10: 10 [IU] via INTRAVENOUS
  Filled 2012-09-10: qty 0.1

## 2012-09-10 MED ORDER — ONDANSETRON HCL 4 MG/2ML IJ SOLN
4.0000 mg | Freq: Three times a day (TID) | INTRAMUSCULAR | Status: AC | PRN
  Filled 2012-09-10: qty 2

## 2012-09-10 MED ORDER — IOHEXOL 300 MG/ML  SOLN: 25.0000 mL | INTRAMUSCULAR | Status: AC

## 2012-09-10 MED ORDER — SODIUM CHLORIDE 0.9 % IV BOLUS (SEPSIS)
1000.0000 mL | Freq: Once | INTRAVENOUS | Status: AC
  Administered 2012-09-10: 1000 mL via INTRAVENOUS

## 2012-09-10 MED ORDER — DEXTROSE 50 % IV SOLN
1.0000 | Freq: Once | INTRAVENOUS | Status: AC
  Administered 2012-09-10: 50 mL via INTRAVENOUS
  Filled 2012-09-10: qty 50

## 2012-09-10 MED ORDER — IOHEXOL 300 MG/ML  SOLN: 50.0000 mL | Freq: Once | INTRAMUSCULAR | Status: DC | PRN

## 2012-09-10 MED ORDER — CHLORHEXIDINE GLUCONATE 0.12 % MT SOLN
15.0000 mL | Freq: Two times a day (BID) | OROMUCOSAL | Status: DC
  Administered 2012-09-10 – 2012-09-11 (×2): 15 mL via OROMUCOSAL
  Filled 2012-09-10 (×6): qty 15

## 2012-09-10 MED ORDER — INSULIN ASPART 100 UNIT/ML ~~LOC~~ SOLN
0.0000 [IU] | Freq: Four times a day (QID) | SUBCUTANEOUS | Status: DC
  Administered 2012-09-10: 2 [IU] via SUBCUTANEOUS
  Administered 2012-09-11 (×2): 1 [IU] via SUBCUTANEOUS
  Administered 2012-09-12: 2 [IU] via SUBCUTANEOUS
  Administered 2012-09-12: 1 [IU] via SUBCUTANEOUS
  Administered 2012-09-12: 2 [IU] via SUBCUTANEOUS
  Administered 2012-09-13: 3 [IU] via SUBCUTANEOUS
  Administered 2012-09-13: 2 [IU] via SUBCUTANEOUS
  Administered 2012-09-13: 1 [IU] via SUBCUTANEOUS
  Administered 2012-09-13: 2 [IU] via SUBCUTANEOUS
  Administered 2012-09-14: 1 [IU] via SUBCUTANEOUS
  Administered 2012-09-14 – 2012-09-15 (×5): 2 [IU] via SUBCUTANEOUS
  Administered 2012-09-15: via SUBCUTANEOUS
  Administered 2012-09-16 (×3): 2 [IU] via SUBCUTANEOUS

## 2012-09-10 MED ORDER — PIPERACILLIN-TAZOBACTAM IN DEX 2-0.25 GM/50ML IV SOLN
2.2500 g | Freq: Three times a day (TID) | INTRAVENOUS | Status: DC
  Administered 2012-09-10 – 2012-09-11 (×3): 2.25 g via INTRAVENOUS
  Filled 2012-09-10 (×5): qty 50

## 2012-09-10 MED ORDER — BIOTENE DRY MOUTH MT LIQD
15.0000 mL | Freq: Two times a day (BID) | OROMUCOSAL | Status: DC
  Administered 2012-09-11 – 2012-09-16 (×6): 15 mL via OROMUCOSAL

## 2012-09-10 NOTE — Progress Notes (Signed)
Pt admitted to unit from ED.  Resides at [facility].  Pt presented to ED with prior stage IV sacral decubitus.  Dressing from SNF removed.  Wound packed with kerlex.  Packing removed, wound examined, wound redressed.  Per Dr. Ellin Saba note, wound consult to be placed.  Troy Cook, [facility]

## 2012-09-10 NOTE — ED Provider Notes (Signed)
Medical screening examination/treatment/procedure(s) were conducted as a shared visit with non-physician practitioner(s) and myself.  I personally evaluated the patient during the encounter  Patient seen examined. X-rays reviewed. Patient has small bowel obstruction. NG tube to be placed. Will be admitted to the medicine service  Toy Baker, MD 09/10/12 1124

## 2012-09-10 NOTE — ED Provider Notes (Signed)
History     CSN: [id number]  Arrival date & time 09/10/12  0940   First MD Initiated Contact with Patient 09/10/12 [phone]      Chief Complaint  Patient presents with  . Abdominal Pain  . Constipation  . Urinary Tract Infection    (Consider location/radiation/quality/duration/timing/severity/associated sxs/prior treatment) Patient is a 79 y.o. male presenting with abdominal pain. The history is provided by the patient, the spouse and medical records. No language interpreter was used.  Abdominal Pain Pain location:  Generalized Pain quality: aching and bloating   Pain radiates to:  Does not radiate Pain severity:  Severe Onset quality:  Gradual Duration:  3 days Timing:  Constant Progression:  Worsening Chronicity:  New Context: awakening from sleep   Relieved by:  Nothing Worsened by:  Nothing tried Ineffective treatments:  None tried Associated symptoms: constipation and dysuria   Associated symptoms: no chest pain, no cough, no diarrhea, no fatigue, no fever, no hematuria, no nausea, no shortness of breath and no vomiting   Risk factors: being elderly     Troy Cook is a 79 y.o. male  with a hx of A. fib, recurrent urinary tract infections, diabetes, hypertension, CAD, CHF presents to the Emergency Department complaining of gradual, persistent, progressively worsening abdominal pain beginning approximately 2 weeks ago and acutely worsening in the last 24 hours.  He states increased fatigue and general illness for approximately 2 weeks.  Patient lives in a skilled care facility as his health has declined significantly after the first urinary tract infection. Patient initially to Cipro and then last week switched to Septra DS. Patient also complaining of significant constipation. His wife states that they have been battling constipation for several weeks but combined with the abdominal pain today and the lack of bowel movements, patient was sent to the ER for evaluation.  Associated symptoms include significant abdominal pain, dysuria, fatigue, general illness, constipation.  Nothing makes it better and urinating makes it worse.  Pt denies fever, chills, headache, chest pain, shortness of breath, nausea, vomiting , hematuria, syncope.  Patient is a DO NOT RESUSCITATE.   Past Medical History  Diagnosis Date  . Atrial fibrillation   . Diabetes mellitus   . Hypertension   . Coronary artery disease   . Hyperlipemia   . ICD (implantable cardiac defibrillator) in place   . CHF (congestive heart failure)     Past Surgical History  Procedure Laterality Date  . Cholecystectomy    . Cardiac defibrillator placement  11/19/2011    single chamber  . Cardiac catheterization    . Cystectomy      No family history on file.  History  Substance Use Topics  . Smoking status: Former Smoker    Quit date: 02/13/2001  . Smokeless tobacco: Never Used  . Alcohol Use: No      Review of Systems  Constitutional: Negative for fever, diaphoresis, appetite change, fatigue and unexpected weight change.  HENT: Negative for mouth sores, trouble swallowing, neck pain and neck stiffness.   Respiratory: Negative for cough, chest tightness, shortness of breath, wheezing and stridor.   Cardiovascular: Negative for chest pain and palpitations.  Gastrointestinal: Positive for abdominal pain and constipation. Negative for nausea, vomiting, diarrhea, blood in stool, abdominal distention and rectal pain.  Genitourinary: Positive for dysuria. Negative for urgency, frequency, hematuria, flank pain and difficulty urinating.  Musculoskeletal: Positive for back pain (chronic).  Skin: Negative for rash.  Neurological: Negative for weakness.  Hematological: Negative for adenopathy.  Psychiatric/Behavioral: Negative for confusion.  All other systems reviewed and are negative.    Allergies  Review of patient's allergies indicates no known allergies.  Home Medications   Current  Outpatient Rx  Name  Route  Sig  Dispense  Refill  . aspirin 81 MG tablet   Oral   Take 81 mg by mouth daily.         . carvedilol (COREG) 12.5 MG tablet   Oral   Take 1 tablet (12.5 mg total) by mouth 2 (two) times daily with a meal.   60 tablet   6   . celecoxib (CELEBREX) 200 MG capsule   Oral   Take 1 capsule (200 mg total) by mouth 2 (two) times daily.   30 capsule   5   . ciprofloxacin (CIPRO) 500 MG tablet   Oral   Take 1 tablet (500 mg total) by mouth 2 (two) times daily.   28 tablet   0   . doxycycline (VIBRA-TABS) 100 MG tablet   Oral   Take 1 tablet (100 mg total) by mouth 2 (two) times daily.   24 tablet   0   . DULoxetine (CYMBALTA) 30 MG capsule   Oral   Take 1 capsule (30 mg total) by mouth daily.   30 capsule   3   . ferrous sulfate 325 (65 FE) MG tablet   Oral   Take 325 mg by mouth daily with breakfast.         . furosemide (LASIX) 20 MG tablet   Oral   Take 20 mg by mouth daily.         Marland Kitchen glipiZIDE (GLUCOTROL XL) 10 MG 24 hr tablet   Oral   Take 10 mg by mouth 2 (two) times daily.         . isosorbide mononitrate (IMDUR) 30 MG 24 hr tablet   Oral   Take 30 mg by mouth daily.         Marland Kitchen lisinopril (PRINIVIL,ZESTRIL) 20 MG tablet   Oral   Take 1 tablet (20 mg total) by mouth daily. Hold for blood pressure 90/60   30 tablet   5   . metFORMIN (GLUCOPHAGE) 500 MG tablet   Oral   Take 1,000 mg by mouth 2 (two) times daily.         . methocarbamol (ROBAXIN) 500 MG tablet   Oral   Take 1 tablet (500 mg total) by mouth 3 (three) times daily.   30 tablet   0   . silodosin (RAPAFLO) 8 MG CAPS capsule   Oral   Take 8 mg by mouth daily with breakfast.         . simvastatin (ZOCOR) 20 MG tablet   Oral   Take 20 mg by mouth every evening.         . sodium polystyrene (KAYEXALATE) powder      30gm x1 po   30 g   0   . traMADol (ULTRAM) 50 MG tablet      Take 1 or 2 po Q 6hrs for pain   16 tablet   0   . warfarin  (COUMADIN) 5 MG tablet   Oral   Take 5 mg by mouth See admin instructions. Pt takes 1 and 1/2 tablets 7.5 mg daily except, takes 10 mg 2 tablets on Monday and Friday           BP 103/68  Pulse 129  Temp(Src) 98.1 F (36.7  C) (Oral)  Resp 22  SpO2 94%  Physical Exam  Nursing note and vitals reviewed. Constitutional: He appears well-developed and well-nourished.  HENT:  Head: Normocephalic and atraumatic.  Mouth/Throat: Oropharynx is clear and moist.  Eyes: Conjunctivae are normal. Pupils are equal, round, and reactive to light. No scleral icterus.  Neck: Normal range of motion.  Cardiovascular: Normal rate, regular rhythm, normal heart sounds and intact distal pulses.  Exam reveals no gallop.   No murmur heard. Pulmonary/Chest: Effort normal and breath sounds normal. No respiratory distress. He has no wheezes. He has no rales. He exhibits no tenderness.  Abdominal: Soft. Bowel sounds are normal. He exhibits no distension and no mass. There is generalized tenderness (significant). There is rigidity and guarding. There is no rebound.  Musculoskeletal: He exhibits no tenderness.  Lymphadenopathy:    He has no cervical adenopathy.  Neurological: He is alert. Coordination normal.  Skin: Skin is warm and dry. There is pallor.  Psychiatric: He has a normal mood and affect.    ED Course  Procedures (including critical care time)  Labs Reviewed  CBC WITH DIFFERENTIAL - Abnormal; Notable for the following:    WBC 45.4 (*)    RBC 4.11 (*)    Hemoglobin 11.6 (*)    HCT 33.4 (*)    RDW 15.8 (*)    Platelets 427 (*)    Neutrophils Relative 96 (*)    Lymphocytes Relative 2 (*)    Monocytes Relative 2 (*)    Neutro Abs 43.6 (*)    All other components within normal limits  COMPREHENSIVE METABOLIC PANEL - Abnormal; Notable for the following:    Sodium 130 (*)    Potassium 6.0 (*)    Chloride 95 (*)    CO2 18 (*)    Glucose, Bld 219 (*)    BUN 104 (*)    Creatinine, Ser 3.38  (*)    Albumin 2.1 (*)    GFR calc non Af Amer 16 (*)    GFR calc Af Amer 19 (*)    All other components within normal limits  CG4 I-STAT (LACTIC ACID) - Abnormal; Notable for the following:    Lactic Acid, Venous 2.80 (*)    All other components within normal limits  LIPASE, BLOOD  URINALYSIS, ROUTINE W REFLEX MICROSCOPIC  PROTIME-INR   Dg Abd Acute W/chest  09/10/2012  *RADIOLOGY REPORT*  Clinical Data: Abdominal pain.  Constipation.  UTI.  ACUTE ABDOMEN SERIES (ABDOMEN 2 VIEW & CHEST 1 VIEW)  Comparison: 11/20/2011  Findings: AICD unchanged in position.  Mildly low lung volumes noted.  Atherosclerotic calcification of the aortic arch is present.  There is degenerative glenohumeral arthropathy bilaterally.  No free intraperitoneal gas is observed on the left side down lateral decubitus view.  There are air-fluid levels scattered in its of dilated small bowel measuring up to 3.7 cm in diameter. Some of these air-fluid levels are probably at differing vertical heights in the same loop of small bowel.  There is a paucity of bowel gas in the pelvis, although more rectal stool is visible.  Prominent lumbar spondylosis observed.  Dextroconvex lumbar scoliosis.  IMPRESSION:  1.  Abnormal air-fluid levels in dilated small bowel.  Appearance is suspicious for small bowel obstruction. 2.  Paucity of bowel gas in the pelvis may be due to more proximal obstruction or less likely a mass in the pelvis.  However, there is formed stool in the sigmoid colon and rectum. 3.  No free intraperitoneal  gas. 4.  Atherosclerosis. 5.  Lumbar spondylosis.   Original Report Authenticated By: Gaylyn Rong, M.D.    ECG:  Date: 09/10/2012  Rate: 132  Rhythm: atrial fibrillation  QRS Axis: normal  Intervals: n/a  ST/T Wave abnormalities: nonspecific T wave changes  Conduction Disutrbances:right bundle branch block  Narrative Interpretation: inverted T waves V1-V4, RBBB new from last ECG  Old EKG Reviewed: changes  noted    1. Small bowel obstruction   2. A-fib   3. Hyperkalemia   4. Acute renal failure   5. ICD (implantable cardiac defibrillator) in place   6. Chronic systolic heart failure    CRITICAL CARE Performed by: Dierdre Forth   Total critical care time:  Critical care time was exclusive of separately billable procedures and treating other patients.  Critical care was necessary to treat or prevent imminent or life-threatening deterioration.  Critical care was time spent personally by me on the following activities: development of treatment plan with patient and/or surrogate as well as nursing, discussions with consultants, evaluation of patient's response to treatment, examination of patient, obtaining history from patient or surrogate, ordering and performing treatments and interventions, ordering and review of laboratory studies, ordering and review of radiographic studies, pulse oximetry and re-evaluation of patient's condition.    MDM  Troy Cook presents with abdominal pain.  Patient is ill-appearing, significantly tachycardic.  Record review of patient's paperwork from skilled care shows that he was recently switched to Septra DS I think this is likely the cause of his renal failure.   On x-ray he is suspicious for a small bowel obstruction.  Patient's CBC concerning with leukocytosis of 45, lactic acid of 2.8, acute renal failure with a BUN of 104 and creatinine of 3.38. Patient is also hyperkalemic. Patient PT/INR significantly elevated with an INR of 5.66. Patient will be admitted to [facility] for further management.   Urinalysis pending however urine is purulent and I am concerned for urosepsis. Hyperkalemia temporized here in the department and NG tube placed for pt comfort and decompression of the abd. Patient borderline hypotensive therefore no pain medication has been given at this time and patient is comfortable with this.  I personally reviewed the imaging tests  through PACS system.  I reviewed available ER/hospitalization records through the EMR.    Dr. Lorre Nick was consulted, evaluated this patient with me and agrees with the plan.         Dahlia Client Mahoganie Basher, PA-C 09/10/12 1656

## 2012-09-10 NOTE — Progress Notes (Addendum)
ANTIBIOTIC CONSULT NOTE - INITIAL  Pharmacy Consult for vancomycin/zosyn Indication: urosepsis  No Known Allergies  Vital Signs: Temp: 98.1 F (36.7 C) (03/29 0945) Temp src: Oral (03/29 0945) BP: 103/68 mmHg (03/29 0945) Pulse Rate: 129 (03/29 0945) Intake/Output from previous day:   Intake/Output from this shift:    Labs:  Recent Labs  09/10/12 1004  WBC 45.4*  HGB 11.6*  PLT 427*  CREATININE 3.38*   The CrCl is unknown because both a height and weight (above a minimum accepted value) are required for this calculation. No results found for this basename: VANCOTROUGH, VANCOPEAK, VANCORANDOM, GENTTROUGH, GENTPEAK, GENTRANDOM, TOBRATROUGH, TOBRAPEAK, TOBRARND, AMIKACINPEAK, AMIKACINTROU, AMIKACIN,  in the last 72 hours   Microbiology: No results found for this or any previous visit (from the past 720 hour(s)).  Medical History: Past Medical History  Diagnosis Date  . Atrial fibrillation   . Diabetes mellitus   . Hypertension   . Coronary artery disease   . Hyperlipemia   . ICD (implantable cardiac defibrillator) in place   . CHF (congestive heart failure)    Assessment: 79 year old male with a hx of A. fib, recurrent urinary tract infections, diabetes, hypertension, CAD, CHF presents to the Emergency Department complaining of gradual, persistent, progressively worsening abdominal pain beginning approximately 2 weeks ago and acutely worsening in the last 24 hours. Patient was started on cipro and later changed to bactrim for UTI.   No fevers noted, wbc elevated at 45 and currently in renal failure with scr of 3.3 giving him an expected CrCl ~15. BP soft. U/a positive for leukocytes, culture pending.   INR 5.6  Goal of Therapy:  Vancomycin trough level 15-20 mcg/ml  Plan:  Measure antibiotic drug levels at steady state Follow up culture results Vancomycin 1500mg  IV x 1 now - will redose as deemed appropriate by renal function. Next dose will likely not be due  until 3/31. Zosyn 2.25g IV q8 hours Follow up renal function for dose adjustments  Sheppard Coil PharmD., BCPS Clinical Pharmacist Pager [phone] 09/10/2012 12:35 PM

## 2012-09-10 NOTE — ED Notes (Signed)
Pt states he's had about 30 UTI's in the past.  The first of February he was given Cipro and states was better.  Pt presents today with c/o LLQ pain described as sharp over the last week and worsening over the last couple of days.  Trouble moving bowels for a week.  Pain worsens when urinating.  4 to 6 weeks ago diagnosed with MRSA.  Pt from [facility] for rehab.  Pt alert and oriented.  Facility reports CBG 126.  Denies N/V, CP, SOB.

## 2012-09-10 NOTE — H&P (Signed)
[facility] History and Physical  [name] ZOX:[id number] DOB: 1931/11/21 DOA: 09/10/2012  Referring physician: ED PCP: Daisy Floro, MD    Chief Complaint:  Chief Complaint  Patient presents with  . Abdominal Pain  . Constipation  . Urinary Tract Infection     HPI: Troy Cook is a 79 y.o. male with history of CAD, CHF, DM, who has been decli having recently UTI, last urine culture positive for MRSA, on septra Ds at [facility] was brought to the ED today for continuing  Deterioration in status, abdominal pain , constipation, weakness, to th epoint of being bed bound and had developed a decubitus ulcer.  Patient is in extreme pain and weakness and is very anxious and apprehensive. History is gathered from [facility] records, epic records and wife at bedside .    Review of Systems: patient is too weak to provide comprehensive review of systems   Past Medical History  Diagnosis Date  . Atrial fibrillation   . Diabetes mellitus   . Hypertension   . Coronary artery disease   . Hyperlipemia   . ICD (implantable cardiac defibrillator) in place   . CHF (congestive heart failure)    Past Surgical History  Procedure Laterality Date  . Cholecystectomy    . Cardiac defibrillator placement  11/19/2011    single chamber  . Cardiac catheterization    . Cystectomy     Social History:  reports that he quit smoking about 11 years ago. He has never used smokeless tobacco. He reports that he does not drink alcohol or use illicit drugs. Used to be independent [facility] until February of this year - since then [facility].   No Known Allergies Fhx - unobtainable  Prior to Admission medications   Medication Sig Start Date End Date Taking? Authorizing Provider  aspirin 81 MG tablet Take 81 mg by mouth daily.    Historical Provider, MD  carvedilol (COREG) 12.5 MG tablet Take 1 tablet (12.5 mg total) by mouth 2 (two) times daily with a  meal. 06/14/12 06/14/13  Marinus Maw, MD  celecoxib (CELEBREX) 200 MG capsule Take 1 capsule (200 mg total) by mouth 2 (two) times daily. 08/31/12   Man X Mast, NP  ciprofloxacin (CIPRO) 500 MG tablet Take 1 tablet (500 mg total) by mouth 2 (two) times daily. 07/21/12   Ward Givens, MD  doxycycline (VIBRA-TABS) 100 MG tablet Take 1 tablet (100 mg total) by mouth 2 (two) times daily. 09/08/12 09/22/12  Man X Mast, NP  DULoxetine (CYMBALTA) 30 MG capsule Take 1 capsule (30 mg total) by mouth daily. 09/01/12   Man X Mast, NP  ferrous sulfate 325 (65 FE) MG tablet Take 325 mg by mouth daily with breakfast.    Historical Provider, MD  furosemide (LASIX) 20 MG tablet Take 20 mg by mouth daily.    Historical Provider, MD  glipiZIDE (GLUCOTROL XL) 10 MG 24 hr tablet Take 10 mg by mouth 2 (two) times daily.    Historical Provider, MD  isosorbide mononitrate (IMDUR) 30 MG 24 hr tablet Take 30 mg by mouth daily.    Historical Provider, MD  lisinopril (PRINIVIL,ZESTRIL) 20 MG tablet Take 1 tablet (20 mg total) by mouth daily. Hold for blood pressure 90/60 08/31/12   Man X Mast, NP  metFORMIN (GLUCOPHAGE) 500 MG tablet Take 1,000 mg by mouth 2 (two) times daily.    Historical Provider, MD  methocarbamol (ROBAXIN) 500 MG tablet Take 1 tablet (500 mg total) by mouth 3 (three) times daily. 07/21/12   Ward Givens, MD  silodosin (RAPAFLO) 8 MG CAPS capsule Take 8 mg by mouth daily with breakfast.    Historical Provider, MD  simvastatin (ZOCOR) 20 MG tablet Take 20 mg by mouth every evening.    Historical Provider, MD  sodium polystyrene (KAYEXALATE) powder 30gm x1 po 09/08/12   Man X Mast, NP  traMADol (ULTRAM) 50 MG tablet Take 1 or 2 po Q 6hrs for pain 07/21/12   Ward Givens, MD  warfarin (COUMADIN) 5 MG tablet Take 5 mg by mouth See admin instructions. Pt takes 1 and 1/2 tablets 7.5 mg daily except, takes 10 mg 2 tablets on Monday and Friday    Historical Provider, MD   Physical Exam: Filed Vitals:   09/10/12 0945  BP:  103/68  Pulse: 129  Temp: 98.1 F (36.7 C)  TempSrc: Oral  Resp: 22  SpO2: 94%   Patient Vitals for the past 24 hrs:  BP Temp Temp src Pulse Resp SpO2 Height Weight  09/10/12 1500 109/64 mmHg - - 139 24 96 % - -  09/10/12 1400 82/61 mmHg 97.9 F (36.6 C) Axillary 140 24 97 % 5\' 11"  (1.803 m) 99.1 kg (218 lb 7.6 oz)  09/10/12 1330 90/58 mmHg - - - 21 - - -  09/10/12 1230 97/51 mmHg - - 134 30 94 % - -  09/10/12 1215 102/52 mmHg - - 133 27 93 % - -  09/10/12 1200 102/60 mmHg - - 130 28 94 % - -  09/10/12 1145 105/51 mmHg - - 131 34 94 % - -  09/10/12 1015 111/64 mmHg - - 128 31 95 % - -  09/10/12 1000 104/72 mmHg - - 131 27 95 % - -  09/10/12 0945 103/68 mmHg 98.1 F (36.7 C) Oral 129 22 94 % - -     General:  Lethargic, uncomfortable due to severe abdominal pain   Eyes: pupils myotic, symmetric   ENT: clear pharynx, dry oral mucosa  Neck: no JVD  Cardiovascular: tachy, irreg irreg, no M  Respiratory: clear ant fields  Abdomen: tender, distended, guarding present, right paraumbilical hernia reducible, very few bowel sounds,   Skin: pale, dry   Musculoskeletal: stage IV sacral decub   Psychiatric: unble to test   Neurologic: unable to test   Labs on Admission:  Basic Metabolic Panel:  Recent Labs Lab 09/10/12 1004  NA 130*  K 6.0*  CL 95*  CO2 18*  GLUCOSE 219*  BUN 104*  CREATININE 3.38*  CALCIUM 9.2   Liver Function Tests:  Recent Labs Lab 09/10/12 1004  AST 8  ALT 9  ALKPHOS 92  BILITOT 0.3  PROT 7.7  ALBUMIN 2.1*    Recent Labs Lab 09/10/12 1004  LIPASE 15   No results found for this basename: AMMONIA,  in the last 168 hours CBC:  Recent Labs Lab 09/10/12 1004  WBC 45.4*  NEUTROABS 43.6*  HGB 11.6*  HCT 33.4*  MCV 81.3  PLT 427*   Cardiac Enzymes: No results found for this basename: CKTOTAL, CKMB, CKMBINDEX, TROPONINI,  in the last 168 hours  BNP (last 3 results)  Recent Labs  10/26/11 1440  PROBNP 2287.0*    CBG: No results found for this basename: GLUCAP,  in the last 168 hours  Radiological Exams on Admission: Dg Abd Acute W/chest  09/10/2012  *RADIOLOGY REPORT*  Clinical Data: Abdominal pain.  Constipation.  UTI.  ACUTE ABDOMEN SERIES (ABDOMEN 2 VIEW & CHEST 1 VIEW)  Comparison: 11/20/2011  Findings: AICD unchanged in position.  Mildly low lung volumes noted.  Atherosclerotic calcification of the aortic arch is present.  There is degenerative glenohumeral arthropathy bilaterally.  No free intraperitoneal gas is observed on the left side down lateral decubitus view.  There are air-fluid levels scattered in its of dilated small bowel measuring up to 3.7 cm in diameter. Some of these air-fluid levels are probably at differing vertical heights in the same loop of small bowel.  There is a paucity of bowel gas in the pelvis, although more rectal stool is visible.  Prominent lumbar spondylosis observed.  Dextroconvex lumbar scoliosis.  IMPRESSION:  1.  Abnormal air-fluid levels in dilated small bowel.  Appearance is suspicious for small bowel obstruction. 2.  Paucity of bowel gas in the pelvis may be due to more proximal obstruction or less likely a mass in the pelvis.  However, there is formed stool in the sigmoid colon and rectum. 3.  No free intraperitoneal gas. 4.  Atherosclerosis. 5.  Lumbar spondylosis.   Original Report Authenticated By: Gaylyn Rong, M.D.       Assessment/Plan Principal Problem:   Sepsis Active Problems:   Normocytic anemia   Dyslipidemia   Atrial fibrillation   ICD (implantable cardiac defibrillator) in place   Chronic systolic heart failure   Pressure ulcer stage IV   UTI (urinary tract infection)   Hyperkalemia   SBO (small bowel obstruction)   AKI (acute kidney injury)   1. Severe Sepsis - multifactorial - UTI, infected decub, r/o bacteremia, - noted from snf mention MRSA in urine - plan to treat with iv vancomycin and Zosyn until cutures are back 2. SBO -  probable distal obstruction - ? From constipation vs adhesions  from prior urological procedure . NG tube, suctioning, bowel rest. patient is too ill for surgical intervention.  3. AKI - multifactorial - suspect from direct infectious injury to the tubules after persistent staph aureus infection , combined with dehydration - plan for iv fluids and closely monitor status  4. Stage IV decub ulcer - will ask for air mattress overlay and wound care consult 5. Cardiomyopathy s/p AICD  6. Hyperkalemia secondary to renal failure 7. Hyponatremia from dehydration 8. DM 2  9. Afib with RVR - decompensated from sepsis - no need for rate controlling meds, treat pain and sepsis. INR > 2 10. Patient has multiple systems and organ failure, he has been deconditioned, bed bound priro to thsi admission. His prognosis is very poor. Wife has communicated intention to pursue comfort care if he does not recover.  11. No plans for pressors, ICU transfer, surgery, more tests     Code Status: dnr  Family Communication: wife   Machell Wirthlin [facility] Pager [phone]  If 7PM-7AM, please contact night-coverage [email or web address] Password [email or web address] 09/10/2012, 12:12 PM

## 2012-09-10 NOTE — ED Notes (Signed)
Rec'd call from Radiology.  Pt will only receive oral contrast due to lab values.  Pt will be a 2 hour oral contrast pt.

## 2012-09-10 NOTE — Progress Notes (Signed)
Pt with elevated HR in the 130-150's.  MD notified.  Orders rec'd.  Will cont to monitor.  Mackinze Criado, [facility]

## 2012-09-11 ENCOUNTER — Encounter (HOSPITAL_COMMUNITY): Payer: Self-pay | Admitting: Internal Medicine

## 2012-09-11 LAB — CBC
HCT: 27.4 % — ABNORMAL LOW (ref 39.0–52.0)
Hemoglobin: 9.4 g/dL — ABNORMAL LOW (ref 13.0–17.0)
MCH: 28.4 pg (ref 26.0–34.0)
MCHC: 34.3 g/dL (ref 30.0–36.0)
MCV: 82.8 fL (ref 78.0–100.0)
RDW: 16.3 % — ABNORMAL HIGH (ref 11.5–15.5)

## 2012-09-11 LAB — BASIC METABOLIC PANEL
CO2: 20 mEq/L (ref 19–32)
Calcium: 8.5 mg/dL (ref 8.4–10.5)
Creatinine, Ser: 1.88 mg/dL — ABNORMAL HIGH (ref 0.50–1.35)
Glucose, Bld: 138 mg/dL — ABNORMAL HIGH (ref 70–99)

## 2012-09-11 LAB — GLUCOSE, CAPILLARY
Glucose-Capillary: 121 mg/dL — ABNORMAL HIGH (ref 70–99)
Glucose-Capillary: 125 mg/dL — ABNORMAL HIGH (ref 70–99)
Glucose-Capillary: 141 mg/dL — ABNORMAL HIGH (ref 70–99)

## 2012-09-11 LAB — PROTIME-INR: INR: 4.41 — ABNORMAL HIGH (ref 0.00–1.49)

## 2012-09-11 MED ORDER — FLEET ENEMA 7-19 GM/118ML RE ENEM
1.0000 | ENEMA | Freq: Every day | RECTAL | Status: DC | PRN
  Administered 2012-09-11: 1 via RECTAL
  Filled 2012-09-11 (×2): qty 1

## 2012-09-11 MED ORDER — CARVEDILOL 3.125 MG PO TABS
3.1250 mg | ORAL_TABLET | Freq: Two times a day (BID) | ORAL | Status: DC
  Administered 2012-09-11 – 2012-09-14 (×6): 3.125 mg via ORAL
  Filled 2012-09-11 (×8): qty 1

## 2012-09-11 MED ORDER — VANCOMYCIN HCL IN DEXTROSE 1-5 GM/200ML-% IV SOLN
1000.0000 mg | INTRAVENOUS | Status: DC
  Administered 2012-09-11 – 2012-09-12 (×2): 1000 mg via INTRAVENOUS
  Filled 2012-09-11 (×3): qty 200

## 2012-09-11 MED ORDER — BISACODYL 10 MG RE SUPP
10.0000 mg | Freq: Once | RECTAL | Status: AC
  Administered 2012-09-11: 10 mg via RECTAL
  Filled 2012-09-11: qty 1

## 2012-09-11 MED ORDER — PIPERACILLIN-TAZOBACTAM 3.375 G IVPB
3.3750 g | Freq: Three times a day (TID) | INTRAVENOUS | Status: DC
  Administered 2012-09-11 – 2012-09-12 (×3): 3.375 g via INTRAVENOUS
  Filled 2012-09-11 (×6): qty 50

## 2012-09-11 MED ORDER — SODIUM CHLORIDE 0.9 % IV BOLUS (SEPSIS)
500.0000 mL | Freq: Once | INTRAVENOUS | Status: AC
  Administered 2012-09-11: 500 mL via INTRAVENOUS

## 2012-09-11 NOTE — Consult Note (Signed)
WOC consult Note Reason for Consult: Pleasant elderly and conversational patient with large Stage IV Pressure Ulcer on Sacrum (Clean, non-granulating).  Patient and wife reside locally in [facility], an age-in-place community. He receives skilled care services at that facility.  Patient was seen by physician extender  from the outpatient wound care center at [facility] while at [facility] (Reported by patient's wife.) Wound type:Pressure Ulceration, Stage IV Pressure Ulcer POA: Yes Measurement:6cm x 5cm x 5cm Wound OZH:YQMVH, pink, moist, non-granulating Drainage (amount, consistency, odor) moderate amount of serosanguinous drainage on old dressing.  Faint odor of feces. (Note: patient has received both a suppository and an enema today.) Periwound:intact Dressing procedure/placement/frequency:I will initiate a low air loss mattress overlay, PT for hydrotherapy and dressing changes in am, 6 days/week.  Nursing to perform dressing change in pm and twice on Sundays.  Guidance provided for turning and repositioning while in bed; no (or limited to meals) supine positioning. Need for hydrotherapy may be reevaluated along with Goals of Care conferences.  In the meantime, WOC Nurse will follow, but not closely. Please re-consult if needed in-between visits. Thanks, Ladona Mow, MSN, RN, [facility], CWOCN [phone])

## 2012-09-11 NOTE — Progress Notes (Signed)
[facility] PROGRESS NOTE  Troy Cook ZOX:[id number] DOB: Aug 04, 1931 DOA: 09/10/2012 PCP: Daisy Floro, MD  Assessment/Plan: 1. Severe Sepsis - multifactorial - UTI, infected decub, r/o bacteremia, - noted from snf mention MRSA in urine - plan to treat with iv vancomycin and Zosyn until cutures are back 2. SBO - probable distal obstruction - ? From constipation vs adhesions from prior surgery. Had NG tube, suctioning, bowel rest from 3/29-3/30 with minimal improvement and minimal fluid suctioned . NG tube removed 09/11/12.  patient is too ill for surgical intervention.  3. AKI - multifactorial - suspect from direct infectious injury to the tubules after persistent staph aureus infection , combined with dehydration -  iv fluids and closely monitor status - improved by 09/11/12 4. Stage IV decub ulcer - will ask for air mattress overlay and wound care consult 5. Cardiomyopathy s/p AICD - last EF 35% 6. Hyperkalemia secondary to renal failure - resolved with D50 and insulin and hydration  7. Hyponatremia from dehydration - resolved  8. DM 2 - SSI  9. Afib with RVR - decompensated from sepsis - started low dose coreg.  treat pain and sepsis. INR > 2 10. Patient has multiple systems and organ failure, he has been deconditioned, bed bound prior to this admission. His prognosis is very poor. Wife has communicated intention to pursue comfort care if he does not recover. Palliative care consult called  11. No plans for pressors, ICU transfer, surgery, more tests      Code Status: dnr  Family Communication: wife  Disposition Plan: ?    Consultants:  Palliative care     Antibiotics: vanc and Zosyn     HPI/Subjective: Very weak Does not have any abdominal pain today   Objective: Filed Vitals:   09/11/12 0900 09/11/12 1100 09/11/12 1300 09/11/12 1539  BP: 100/50 124/57 114/54 100/46  Pulse: 114 59 126 132  Temp:  98.4 F (36.9 C)  94.1 F (34.5 C)  TempSrc:  Oral  Oral   Resp: 16 24 17 20   Height:    5\' 11"  (1.803 m)  Weight:    100.5 kg (221 lb 9 oz)  SpO2: 99% 99% 97% 99%   Patient Vitals for the past 24 hrs:  BP Temp Temp src Pulse Resp SpO2 Height Weight  09/11/12 1539 100/46 mmHg 94.1 F (34.5 C) Oral 132 20 99 % 5\' 11"  (1.803 m) 100.5 kg (221 lb 9 oz)  09/11/12 1300 114/54 mmHg - - 126 17 97 % - -  09/11/12 1100 124/57 mmHg 98.4 F (36.9 C) Oral 59 24 99 % - -  09/11/12 0900 100/50 mmHg - - 114 16 99 % - -  09/11/12 0809 - 97.4 F (36.3 C) Oral - - - - -  09/11/12 0600 87/49 mmHg - - 75 15 98 % - -  09/11/12 0500 - - - 75 19 99 % - -  09/11/12 0400 91/37 mmHg - - 128 21 99 % - -  09/11/12 0352 - 98.2 F (36.8 C) Oral - - - - -  09/11/12 0300 97/57 mmHg - - 129 20 97 % - -  09/11/12 0200 107/52 mmHg - - 67 21 97 % - -  09/11/12 0100 102/50 mmHg - - 79 20 96 % - -  09/11/12 0012 - 98.2 F (36.8 C) Oral - - - - -  09/11/12 0000 97/72 mmHg - - 68 25 87 % - -  09/10/12 2300 97/42 mmHg - -  69 20 98 % - -  09/10/12 2200 106/33 mmHg - - 116 19 98 % - -  09/10/12 2100 110/54 mmHg - - 49 20 98 % - -  09/10/12 2000 109/42 mmHg 97.8 F (36.6 C) Oral 80 22 100 % - -  09/10/12 1900 - - - 107 24 96 % - -  09/10/12 1700 123/66 mmHg - - 125 25 97 % - -     Intake/Output Summary (Last 24 hours) at 09/11/12 1610 Last data filed at 09/11/12 1400  Gross per 24 hour  Intake   2600 ml  Output   2450 ml  Net    150 ml   Filed Weights   09/10/12 1400 09/11/12 1539  Weight: 99.1 kg (218 lb 7.6 oz) 100.5 kg (221 lb 9 oz)    Exam:   General:  Lethargic, pale   Cardiovascular: irreg irreg   Respiratory: clear ant   Abdomen: less distended, positive LLQ tenderness   Musculoskeletal: diminished myscle bulk and tone    Data Reviewed: Basic Metabolic Panel:  Recent Labs Lab 09/10/12 1004 09/11/12 0625  NA 130* 137  K 6.0* 4.4  CL 95* 105  CO2 18* 20  GLUCOSE 219* 138*  BUN 104* 82*  CREATININE 3.38* 1.88*  CALCIUM 9.2 8.5   Liver  Function Tests:  Recent Labs Lab 09/10/12 1004  AST 8  ALT 9  ALKPHOS 92  BILITOT 0.3  PROT 7.7  ALBUMIN 2.1*    Recent Labs Lab 09/10/12 1004  LIPASE 15   No results found for this basename: AMMONIA,  in the last 168 hours CBC:  Recent Labs Lab 09/10/12 1004 09/11/12 0625  WBC 45.4* 26.2*  NEUTROABS 43.6*  --   HGB 11.6* 9.4*  HCT 33.4* 27.4*  MCV 81.3 82.8  PLT 427* 337   Cardiac Enzymes: No results found for this basename: CKTOTAL, CKMB, CKMBINDEX, TROPONINI,  in the last 168 hours BNP (last 3 results)  Recent Labs  10/26/11 1440  PROBNP 2287.0*   CBG:  Recent Labs Lab 09/10/12 1252 09/10/12 1546 09/10/12 2355 09/11/12 0604 09/11/12 1152  GLUCAP 235* 183* 135* 121* 125*    Recent Results (from the past 240 hour(s))  MRSA PCR SCREENING     Status: None   Collection Time    09/10/12  1:49 PM      Result Value Range Status   MRSA by PCR NEGATIVE  NEGATIVE Final   Comment:            The GeneXpert MRSA Assay (FDA     approved for NASAL specimens     only), is one component of a     comprehensive MRSA colonization     surveillance program. It is not     intended to diagnose MRSA     infection nor to guide or     monitor treatment for     MRSA infections.     Studies: Dg Abd Acute W/chest  09/10/2012  *RADIOLOGY REPORT*  Clinical Data: Abdominal pain.  Constipation.  UTI.  ACUTE ABDOMEN SERIES (ABDOMEN 2 VIEW & CHEST 1 VIEW)  Comparison: 11/20/2011  Findings: AICD unchanged in position.  Mildly low lung volumes noted.  Atherosclerotic calcification of the aortic arch is present.  There is degenerative glenohumeral arthropathy bilaterally.  No free intraperitoneal gas is observed on the left side down lateral decubitus view.  There are air-fluid levels scattered in its of dilated small bowel measuring up to 3.7 cm  in diameter. Some of these air-fluid levels are probably at differing vertical heights in the same loop of small bowel.  There is a  paucity of bowel gas in the pelvis, although more rectal stool is visible.  Prominent lumbar spondylosis observed.  Dextroconvex lumbar scoliosis.  IMPRESSION:  1.  Abnormal air-fluid levels in dilated small bowel.  Appearance is suspicious for small bowel obstruction. 2.  Paucity of bowel gas in the pelvis may be due to more proximal obstruction or less likely a mass in the pelvis.  However, there is formed stool in the sigmoid colon and rectum. 3.  No free intraperitoneal gas. 4.  Atherosclerosis. 5.  Lumbar spondylosis.   Original Report Authenticated By: Gaylyn Rong, M.D.     Scheduled Meds: . antiseptic oral rinse  15 mL Mouth Rinse q12n4p  . carvedilol  3.125 mg Oral BID WC  . chlorhexidine  15 mL Mouth Rinse BID  . insulin aspart  0-9 Units Subcutaneous Q6H  . piperacillin-tazobactam (ZOSYN)  IV  3.375 g Intravenous Q8H  . vancomycin  1,000 mg Intravenous Q24H   Continuous Infusions: . sodium chloride 100 mL/hr at 09/11/12 9562    Principal Problem:   Sepsis Active Problems:   Normocytic anemia   Dyslipidemia   Atrial fibrillation   ICD (implantable cardiac defibrillator) in place   Chronic systolic heart failure   Pressure ulcer stage IV   UTI (urinary tract infection)   Hyperkalemia   SBO (small bowel obstruction)   AKI (acute kidney injury)      Aleecia Tapia  [facility] Pager [phone]. If 7PM-7AM, please contact night-coverage at [email or web address], password [facility] 09/11/2012, 4:10 PM  LOS: 1 day

## 2012-09-11 NOTE — Progress Notes (Signed)
ANTIBIOTIC CONSULT NOTE - INITIAL  Pharmacy Consult for vancomycin/zosyn Indication: urosepsis  No Known Allergies  Vital Signs: Temp: 97.4 F (36.3 C) (03/30 0809) Temp src: Oral (03/30 0809) BP: 100/50 mmHg (03/30 0900) Pulse Rate: 114 (03/30 0900) Intake/Output from previous day: 03/29 0701 - 03/30 0700 In: 2600 [I.V.:1950; IV Piggyback:650] Out: 1950 [Urine:1550; Emesis/NG output:400] Intake/Output from this shift: Total I/O In: 300 [I.V.:300] Out: -   Labs:  Recent Labs  09/10/12 1004 09/11/12 0625  WBC 45.4* 26.2*  HGB 11.6* 9.4*  PLT 427* 337  CREATININE 3.38* 1.88*   Estimated Creatinine Clearance: 38.8 ml/min (by C-G formula based on Cr of 1.88). No results found for this basename: VANCOTROUGH, Leodis Binet, VANCORANDOM, GENTTROUGH, GENTPEAK, GENTRANDOM, TOBRATROUGH, TOBRAPEAK, TOBRARND, AMIKACINPEAK, AMIKACINTROU, AMIKACIN,  in the last 72 hours   Microbiology: Recent Results (from the past 720 hour(s))  MRSA PCR SCREENING     Status: None   Collection Time    09/10/12  1:49 PM      Result Value Range Status   MRSA by PCR NEGATIVE  NEGATIVE Final   Comment:            The GeneXpert MRSA Assay (FDA     approved for NASAL specimens     only), is one component of a     comprehensive MRSA colonization     surveillance program. It is not     intended to diagnose MRSA     infection nor to guide or     monitor treatment for     MRSA infections.    Medical History: Past Medical History  Diagnosis Date  . Atrial fibrillation   . Diabetes mellitus   . Hypertension   . Coronary artery disease   . Hyperlipemia   . ICD (implantable cardiac defibrillator) in place   . CHF (congestive heart failure)    Assessment: 79 year old male with a hx of A. fib, recurrent urinary tract infections, diabetes, hypertension, CAD, CHF presents to the Emergency Department complaining of gradual, persistent, progressively worsening abdominal pain beginning approximately 2  weeks ago and acutely worsening prior to admit. Patient was started on cipro and later changed to bactrim for UTI.   Patient is afebrile, wbc elevated at 45 on admit now down to 26.2. Presented in renal failure with scr of 3.3 now much improved at 1.88 today. BP soft. U/a positive for leukocytes, culture pending.   Goal of Therapy:  Vancomycin trough level 15-20 mcg/ml  Plan:  Vancomycin 1000mg  q24h Zosyn 3.375G IV q8h to be infused over 4 hours Follow up renal function and drug levels for dose adjustments   Vania Rea. Darin Engels.D. Clinical Pharmacist Pager [phone] Phone [phone] 09/11/2012 11:20 AM

## 2012-09-12 DIAGNOSIS — A4902 Methicillin resistant Staphylococcus aureus infection, unspecified site: Secondary | ICD-10-CM | POA: Diagnosis present

## 2012-09-12 LAB — URINE CULTURE

## 2012-09-12 MED ORDER — PRO-STAT SUGAR FREE PO LIQD
30.0000 mL | Freq: Two times a day (BID) | ORAL | Status: DC
  Administered 2012-09-12 – 2012-09-16 (×6): 30 mL via ORAL
  Filled 2012-09-12 (×11): qty 30

## 2012-09-12 MED ORDER — ADULT MULTIVITAMIN W/MINERALS CH
1.0000 | ORAL_TABLET | Freq: Every day | ORAL | Status: DC
  Administered 2012-09-12 – 2012-09-16 (×4): 1 via ORAL
  Filled 2012-09-12 (×6): qty 1

## 2012-09-12 MED ORDER — JUVEN PO PACK
1.0000 | PACK | Freq: Two times a day (BID) | ORAL | Status: DC
  Administered 2012-09-13 – 2012-09-16 (×5): 1 via ORAL
  Filled 2012-09-12 (×11): qty 1

## 2012-09-12 MED ORDER — BOOST / RESOURCE BREEZE PO LIQD
1.0000 | Freq: Three times a day (TID) | ORAL | Status: DC
  Administered 2012-09-12 – 2012-09-16 (×7): 1 via ORAL

## 2012-09-12 MED ORDER — METHYLNALTREXONE BROMIDE 12 MG/0.6ML ~~LOC~~ SOLN
8.0000 mg | Freq: Once | SUBCUTANEOUS | Status: AC
  Administered 2012-09-12: 8 mg via SUBCUTANEOUS
  Filled 2012-09-12: qty 0.6

## 2012-09-12 MED ORDER — METOCLOPRAMIDE HCL 5 MG/ML IJ SOLN
10.0000 mg | Freq: Four times a day (QID) | INTRAMUSCULAR | Status: DC
  Administered 2012-09-12 – 2012-09-16 (×17): 10 mg via INTRAVENOUS
  Filled 2012-09-12 (×21): qty 2

## 2012-09-12 MED ORDER — LORAZEPAM 2 MG/ML IJ SOLN
0.2500 mg | INTRAMUSCULAR | Status: DC | PRN
  Administered 2012-09-13 – 2012-09-15 (×5): 0.25 mg via INTRAVENOUS
  Filled 2012-09-12 (×5): qty 1

## 2012-09-12 NOTE — Progress Notes (Addendum)
INITIAL NUTRITION ASSESSMENT  DOCUMENTATION CODES Per approved criteria  -Obesity Unspecified   INTERVENTION: 1. Juven 1 packet PO BID  2. Resource Breeze po TID, each supplement provides 250 kcal and 9 grams of protein.  3. 30 ml Prostat PO BID, each supplement provides 100 kcal and 15 grams protein.  4. Consider 2 week therapy of 220 mg zinc sulfate daily and 500 mg vitamin C BID supplementation to support wound healing  5. Diet advancement per MD; recommend least restrictive diet to encourage PO intake  6. RD to continue to follow nutrition care plan  NUTRITION DIAGNOSIS: Increased nutrient needs related to wound healing as evidenced by advanced wound and estimated needs.   Goal: Intake to meet >90% of estimated nutrition needs.  Monitor:  weight trends, lab trends, I/O's, PO intake, supplement tolerance  Reason for Assessment: Malnutrition Screening  79 y.o. male  Admitting Dx: Sepsis  ASSESSMENT: Hx of CAD, CHF, DM, recent UTI, last urine culture positive for MRSA. Brought to the ED (from [facility]) for continuing deterioration in status, abdominal pain, constipation, and weakness to the point of being bed bound development of a decubitus ulcer.   Patient is in extreme pain and weakness and is very anxious and apprehensive. Asking for aggressive pain control. Refusing depression meds. Confirms poor appetite and nausea.  WOC RN saw pt yesterday. Work-up reveals stage IV pressure ulcer on sacrum 6 cm x 5 cm x 5 cm. Hydrotherapy initiated today.  GOC addressed this morning, pt wants full scope medial treatment for reversible problems and "expert opinion on whether or not the wound will heal."   Pt confirms recent poor oral intake. Taking clear liquid trays well at this time. Agreeable to oral nutrition supplements. Per nursing home MAR, pt receives 2 scoops of Beneprotein po BID and 90 cc Resource with medications.  Height: Ht Readings from Last 1 Encounters:  09/11/12  5\' 11"  (1.803 m)    Weight: Wt Readings from Last 1 Encounters:  09/11/12 226 lb 10.1 oz (102.8 kg)    Ideal Body Weight: 172 lb  % Ideal Body Weight: 131%  Wt Readings from Last 10 Encounters:  09/11/12 226 lb 10.1 oz (102.8 kg)  02/19/12 232 lb 12.8 oz (105.597 kg)  11/19/11 227 lb (102.967 kg)  11/19/11 227 lb (102.967 kg)  10/29/11 250 lb 6.4 oz (113.581 kg)    Usual Body Weight: 232 lb  % Usual Body Weight: 97%  BMI:  Body mass index is 31.62 kg/(m^2). Obese Class I.  Estimated Nutritional Needs: Kcal: 2200 - 2500 Protein: 145  - 150 grams Fluid: at least 2.2 liters daily  Skin: stage IV sacrum  Diet Order: Clear Liquid  EDUCATION NEEDS: -No education needs identified at this time   Intake/Output Summary (Last 24 hours) at 09/12/12 1523 Last data filed at 09/12/12 1300  Gross per 24 hour  Intake   2200 ml  Output   1300 ml  Net    900 ml    Last BM: 3/30  Labs:   Recent Labs Lab 09/10/12 1004 09/11/12 0625  NA 130* 137  K 6.0* 4.4  CL 95* 105  CO2 18* 20  BUN 104* 82*  CREATININE 3.38* 1.88*  CALCIUM 9.2 8.5  GLUCOSE 219* 138*    CBG (last 3)   Recent Labs  09/12/12 0039 09/12/12 0415 09/12/12 1233  GLUCAP 125* 122* 138*    Scheduled Meds: . antiseptic oral rinse  15 mL Mouth Rinse q12n4p  .  carvedilol  3.125 mg Oral BID WC  . insulin aspart  0-9 Units Subcutaneous Q6H  . methylnaltrexone  8 mg Subcutaneous Once  . metoCLOPramide (REGLAN) injection  10 mg Intravenous Q6H  . vancomycin  1,000 mg Intravenous Q24H    Continuous Infusions: . sodium chloride 50 mL/hr at 09/12/12 1610    Past Medical History  Diagnosis Date  . Atrial fibrillation   . Diabetes mellitus   . Hypertension   . Coronary artery disease   . Hyperlipemia   . ICD (implantable cardiac defibrillator) in place   . CHF (congestive heart failure)     Past Surgical History  Procedure Laterality Date  . Cholecystectomy    . Cardiac defibrillator  placement  11/19/2011    single chamber  . Cardiac catheterization      Jarold Motto MS, RD, LDN Pager: [phone] After-hours pager: [phone]

## 2012-09-12 NOTE — Progress Notes (Signed)
Utilization review completed.  

## 2012-09-12 NOTE — ED Provider Notes (Signed)
Medical screening examination/treatment/procedure(s) were performed by non-physician practitioner and as supervising physician I was immediately available for consultation/collaboration.  Jullianna Gabor T Vanda Waskey, MD 09/12/12 1958 

## 2012-09-12 NOTE — Progress Notes (Addendum)
Thank you for consulting the Palliative Medicine Team at [facility] to meet your patient's and family's needs.   The reason that you asked [place] to see your patient is for Goals of Care, Symptom Management, Care Coordination.   We have scheduled your patient for a meeting: TBD  Additional Narrative:   Patient seen this AM. He was having wound lavage and it was difficult for him to speak with me. I have tried to get in touch with his wife unsuccessfully. He tells me she will be up here soon. They want Theda Belfast to assist with goals of care meeting. Will schedule ASAP.  Anderson Malta, DO Palliative Medicine

## 2012-09-12 NOTE — Consult Note (Addendum)
Palliative Medicine Team at [facility]  Date: 09/12/2012   Patient Name: Troy Cook  DOB: 03-21-1932  MRN: [id number]  Age / Sex: 79 y.o., male   PCP: Gildardo Cranker, MD Referring Physician: Lorane Gell, MD  HPI/Reason for Consultation: 79 yo retired [employer] admitted with severe sepsis from MRSA UTI with an advanced sacral wound in the location of a prior pilonidal cyst that has been a lifelong problem for him. He has had a dramatic decline in health since February. The road ahead of him is uncertain in terms of his ability to heal and his prospects for a good QOL.   Problem List: 1. MRSA UTI complicated by septic shock  2. Large stage IV decubitus-undergoing hydrotherapy  3. Diabetes, with complications 4. Ischemic Systolic CHF, with ICD implantation 11/2011- compensated 5. ASCVD, medical management 6. Atrial Fibrillation 7. Severe constipation related ileus due to immobility and opiates 8. Depression, difficulty coping, anxiety, spiritual suffering in setting of his illness  Summary of Goals:  Advanced care plans in place. Will focus on symptom management and sharing prognostic information which are the main goals expressed by the patient.  1. DNR 2. Full scope medical treatment for reversible problems. 3. Wants expert opinion on whether or not the wound will heal on his sacrum. 4. Does not want to spend rest of his life at [facility] or he had "rather be dead", he hopes for a quick death like a heart attack. 5. Refuses medicine for depression 6. Desires aggressive pain control. 7. Severe issues with constipation-he wants his bowel "taken care of" 8. Poor appetite, nausea  Appreciate Theda Belfast being present for the Palliative Care meeting to assist with his spiritual suffering and in supporting patient's confidence in more openly discuss his fears and concerns-[name] describes having serious issues with a pilonidal cyst when he was 79 years old, he missed an  entire year of high school, he had low self esteem from the wound,the wound almost never healed- he says he has always had pain at the base of his tailbone. I suspect his current high level of frustration and depression/suffering is related those early experience with illness, pain and his current wound is same wound area as when he was a child -and complication from that may actually lead to his death.  Additionally [name] is grieving the loss of his QOL, he no longer has the same hopes for his future than before he got sick and it is causing him a great deal of pain. He is struggling with guilt for not taking care of his body before now. He is angry, he is bargaining. Offered support.  Plans/Recs:    Patient has tried multiple bowel regimens with no help including oral laxatives, PR laxative etc.. He is cramping severely during our conversation- will schedule reglan for his nausea and give one dose of Relistor to induce bowel movement. He may have SBO on plain films.  Premedicate with opaite and anxiety medication prior to wound care.   Time: 70 minutes Greater than 50%  of this time was spent counseling and coordinating care related to the above assessment and plan.  Signed by: Edsel Petrin, DO  09/12/2012, 12:29 PM  Please contact Palliative Medicine Team phone at [phone] for questions and concerns.

## 2012-09-12 NOTE — Progress Notes (Signed)
Physical Therapy Wound Treatment  Patient Details  Name: Troy Cook MRN: [id number] Date of Birth: 08-07-1931  Today's Date: 09/12/2012 Time: 0912-1004 Time Calculation (min): 52 min  Subjective  Subjective: Can this thing heal? Patient and Family Stated Goals: wants wound to close Prior Treatments: hydrogel, honey dressing  Pain Score:    Wound Assessment  Pressure Ulcer 09/10/12 Stage IV - Full thickness tissue loss with exposed bone, tendon or muscle. 3.5"x2"x1.5"  (Active)  State of Healing Early/partial granulation 09/12/2012 10:10 AM  Site / Wound Assessment Granulation tissue;Pink;Yellow;Other (Comment) 09/12/2012 10:10 AM  % Wound base Red or Granulating 80% 09/12/2012 10:10 AM  % Wound base Yellow 15% 09/12/2012 10:10 AM  % Wound base Black 5% 09/11/2012  3:00 PM  % Wound base Other (Comment) 5% 09/12/2012 10:10 AM  Peri-wound Assessment Denuded;Erythema (blanchable) 09/12/2012 10:10 AM  Wound Length (cm) 12.5 cm 09/12/2012 10:10 AM  Wound Width (cm) 6.5 cm 09/12/2012 10:10 AM  Wound Depth (cm) 4.3 cm 09/12/2012 10:10 AM  Tunneling (cm) .5 09/11/2012  3:00 PM  Undermining (cm) 2.0 09/12/2012 10:10 AM  Margins Unattacted edges (unapproximated) 09/12/2012 10:10 AM  Drainage Amount Minimal 09/12/2012 10:10 AM  Drainage Description No odor 09/12/2012 10:10 AM  Treatment Hydrotherapy (Pulse lavage);Packing (Saline gauze);Tape changed 09/12/2012 10:10 AM  Dressing Type ABD;Barrier Film (skin prep);Gauze (Comment);Moist to moist;Tape dressing 09/12/2012 10:10 AM  Dressing Changed 09/12/2012 10:10 AM            Hydrotherapy Pulsed lavage therapy - wound location: sacrum Pulsed Lavage with Suction (psi): 4 psi Pulsed Lavage with Suction - Normal Saline Used: 1000 mL Pulsed Lavage Tip: Tip with splash shield   Wound Assessment and Plan  Wound Therapy - Assess/Plan/Recommendations Wound Therapy - Clinical Statement: Pt adm with stage IV pressure ulcer to sacrum due to bedbound status x  1-2 months. Agree with hydrotherapy to assist with removal of necrotic tissue and to stimulate growth of granulation tissue. Pt eager to do all that can be done to heal his wound and recognizes that surgery is not an option for him due to his other co-morbidities. Wound Therapy - Functional Problem List: limited positioning options due to decr skin integrity Factors Delaying/Impairing Wound Healing: Infection - systemic/local;Immobility;Multiple medical problems Hydrotherapy Plan: Debridement;Dressing change;Patient/family education;Pulsatile lavage with suction Wound Therapy - Frequency: 6X / week Wound Therapy - Current Recommendations: Nutritionist Wound Therapy - Follow Up Recommendations: Skilled nursing facility  Wound Therapy Goals- Improve the function of patient's integumentary system by progressing the wound(s) through the phases of wound healing (inflammation - proliferation - remodeling) by: Decrease Necrotic Tissue to: 10 Decrease Necrotic Tissue - Progress: Goal set today Increase Granulation Tissue to: 90 Increase Granulation Tissue - Progress: Goal set today Decrease Length/Width/Depth by (cm): .2 Decrease Length/Width/Depth - Progress: Goal set today Patient/Family will be able to : verbalize proper positioning for pressure reduction to assist with healing Patient/Family Instruction Goal - Progress: Goal set today Goals/treatment plan/discharge plan were made with and agreed upon by patient/family: Yes Time For Goal Achievement: 7 days Wound Therapy - Potential for Goals: Fair  Goals will be updated until maximal potential achieved or discharge criteria met.  Discharge criteria: when goals achieved, discharge from hospital, MD decision/surgical intervention, no progress towards goals, refusal/missing three consecutive treatments without notification or medical reason.  GP     Garlan Drewes 09/12/2012, 10:23 AM  09/12/2012 Veda Canning, PT Pager: [phone]

## 2012-09-12 NOTE — Progress Notes (Signed)
MD notified of pt's positive urine culture for MRSA. No new orders received.

## 2012-09-12 NOTE — Progress Notes (Signed)
[facility] PROGRESS NOTE  Troy Cook ZOX:[id number] DOB: 01/08/1932 DOA: 09/10/2012 PCP: Daisy Floro, MD  Assessment/Plan: 1. Severe Sepsis - multifactorial - MRSA UTI, - patient most likely has staph aureus bacteremia which will  explain why he had persistent mrsa utis.  Started empiric iv vancomycin and Zosyn on admission . Zosyn stopped 3/31 after culture results back. Continue Vancomycin iv  2. SBO - probable distal obstruction - ? From constipation vs adhesions from prior surgery. Had NG tube, suctioning, bowel rest from 3/29-3/30 with minimal improvement and minimal fluid suctioned . NG tube removed 09/11/12.  patient is too ill for surgical intervention. Constipation resolved by 3/31 and SBO resolved .  3. AKI - multifactorial - suspect from direct infectious injury to the tubules after persistent staph aureus infection , combined with dehydration -  iv fluids and closely monitor status - improved by 09/11/12. Iv fluids stopped 09/12/12. Good UO.  4. Stage IV decub ulcer - air mattress overlay.  wound care consult obtained on 3/30 and patient started hydrotherapy 3/31.  5. Cardiomyopathy s/p AICD - last EF 35% 6. Hyperkalemia secondary to renal failure - resolved with D50 and insulin and hydration  7. Hyponatremia from dehydration - resolved  8. DM 2 - SSI  9. Afib with RVR - decompensated from sepsis - started low dose coreg.  treat pain and sepsis. INR > 2 10. Patient has multiple systems and organ failure, he has been deconditioned, bed bound prior to this admission. His prognosis is very poor. Wife has communicated intention to pursue comfort care if he does not recover. Palliative care consult called 3/30 11. No plans for pressors, ICU transfer, surgery, more tests      Code Status: dnr  Family Communication: wife  Disposition Plan: ?    Consultants:  Palliative care     Antibiotics: Vancomycin 3/29   HPI/Subjective: Asleep   Objective: Filed Vitals:    09/11/12 2218 09/12/12 0445 09/12/12 1000 09/12/12 1400  BP: 84/56 95/54 94/54  101/60  Pulse: 113 108 114 112  Temp: 97.5 F (36.4 C) 97.4 F (36.3 C) 98 F (36.7 C) 98.2 F (36.8 C)  TempSrc: Oral Oral Oral Oral  Resp: 18 17 18 16   Height:      Weight: 102.8 kg (226 lb 10.1 oz)     SpO2: 100% 100% 100% 100%   Patient Vitals for the past 24 hrs:  BP Temp Temp src Pulse Resp SpO2 Weight  09/12/12 1400 101/60 mmHg 98.2 F (36.8 C) Oral 112 16 100 % -  09/12/12 1000 94/54 mmHg 98 F (36.7 C) Oral 114 18 100 % -  09/12/12 0445 95/54 mmHg 97.4 F (36.3 C) Oral 108 17 100 % -  09/11/12 2218 84/56 mmHg 97.5 F (36.4 C) Oral 113 18 100 % 102.8 kg (226 lb 10.1 oz)  09/11/12 1752 98/55 mmHg 98.5 F (36.9 C) Oral 127 20 100 % -     Intake/Output Summary (Last 24 hours) at 09/12/12 1606 Last data filed at 09/12/12 1300  Gross per 24 hour  Intake   2200 ml  Output   1300 ml  Net    900 ml   Filed Weights   09/10/12 1400 09/11/12 1539 09/11/12 2218  Weight: 99.1 kg (218 lb 7.6 oz) 100.5 kg (221 lb 9 oz) 102.8 kg (226 lb 10.1 oz)    Exam:   General:  Lethargic, pale, arousable    Cardiovascular: irreg irreg   Respiratory: clear ant  Abdomen: not distended, no further  LLQ tenderness   Musculoskeletal: diminished muscle bulk and tone    Data Reviewed: Basic Metabolic Panel:  Recent Labs Lab 09/10/12 1004 09/11/12 0625  NA 130* 137  K 6.0* 4.4  CL 95* 105  CO2 18* 20  GLUCOSE 219* 138*  BUN 104* 82*  CREATININE 3.38* 1.88*  CALCIUM 9.2 8.5   Liver Function Tests:  Recent Labs Lab 09/10/12 1004  AST 8  ALT 9  ALKPHOS 92  BILITOT 0.3  PROT 7.7  ALBUMIN 2.1*    Recent Labs Lab 09/10/12 1004  LIPASE 15   No results found for this basename: AMMONIA,  in the last 168 hours CBC:  Recent Labs Lab 09/10/12 1004 09/11/12 0625  WBC 45.4* 26.2*  NEUTROABS 43.6*  --   HGB 11.6* 9.4*  HCT 33.4* 27.4*  MCV 81.3 82.8  PLT 427* 337   Cardiac  Enzymes: No results found for this basename: CKTOTAL, CKMB, CKMBINDEX, TROPONINI,  in the last 168 hours BNP (last 3 results)  Recent Labs  10/26/11 1440  PROBNP 2287.0*   CBG:  Recent Labs Lab 09/11/12 1152 09/11/12 1753 09/12/12 0039 09/12/12 0415 09/12/12 1233  GLUCAP 125* 141* 125* 122* 138*    Recent Results (from the past 240 hour(s))  URINE CULTURE     Status: None   Collection Time    09/10/12 11:39 AM      Result Value Range Status   Specimen Description URINE, CATHETERIZED   Final   Special Requests NONE   Final   Culture  Setup Time 09/10/2012 17:34   Final   Colony Count >=100,000 COLONIES/ML   Final   Culture     Final   Value: METHICILLIN RESISTANT STAPHYLOCOCCUS AUREUS     Note: RIFAMPIN AND GENTAMICIN SHOULD NOT BE USED AS SINGLE DRUGS FOR TREATMENT OF STAPH INFECTIONS. CRITICAL RESULT CALLED TO, READ BACK BY AND VERIFIED WITH: TY H@1156  ON [id number] BY [facility]   Report Status 09/12/2012 FINAL   Final   Organism ID, Bacteria METHICILLIN RESISTANT STAPHYLOCOCCUS AUREUS   Final  MRSA PCR SCREENING     Status: None   Collection Time    09/10/12  1:49 PM      Result Value Range Status   MRSA by PCR NEGATIVE  NEGATIVE Final   Comment:            The GeneXpert MRSA Assay (FDA     approved for NASAL specimens     only), is one component of a     comprehensive MRSA colonization     surveillance program. It is not     intended to diagnose MRSA     infection nor to guide or     monitor treatment for     MRSA infections.     Studies: No results found.  Scheduled Meds: . antiseptic oral rinse  15 mL Mouth Rinse q12n4p  . carvedilol  3.125 mg Oral BID WC  . feeding supplement  30 mL Oral BID  . feeding supplement  1 Container Oral TID WC  . insulin aspart  0-9 Units Subcutaneous Q6H  . methylnaltrexone  8 mg Subcutaneous Once  . metoCLOPramide (REGLAN) injection  10 mg Intravenous Q6H  . multivitamin with minerals  1 tablet Oral Daily  . nutrition  supplement  1 packet Oral BID BM  . vancomycin  1,000 mg Intravenous Q24H   Continuous Infusions:    Principal Problem:   Sepsis Active Problems:  Normocytic anemia   Dyslipidemia   Atrial fibrillation   ICD (implantable cardiac defibrillator) in place   Chronic systolic heart failure   Pressure ulcer stage IV   UTI (urinary tract infection)   Hyperkalemia   SBO (small bowel obstruction)   AKI (acute kidney injury)      Breindy Meadow  [facility] Pager [phone]. If 7PM-7AM, please contact night-coverage at [email or web address], password [facility] 09/12/2012, 4:06 PM  LOS: 2 days

## 2012-09-13 DIAGNOSIS — L8994 Pressure ulcer of unspecified site, stage 4: Secondary | ICD-10-CM

## 2012-09-13 DIAGNOSIS — A4102 Sepsis due to Methicillin resistant Staphylococcus aureus: Secondary | ICD-10-CM

## 2012-09-13 DIAGNOSIS — T827XXA Infection and inflammatory reaction due to other cardiac and vascular devices, implants and grafts, initial encounter: Secondary | ICD-10-CM

## 2012-09-13 DIAGNOSIS — R7881 Bacteremia: Secondary | ICD-10-CM

## 2012-09-13 DIAGNOSIS — L8990 Pressure ulcer of unspecified site, unspecified stage: Secondary | ICD-10-CM

## 2012-09-13 DIAGNOSIS — I4891 Unspecified atrial fibrillation: Secondary | ICD-10-CM

## 2012-09-13 LAB — BASIC METABOLIC PANEL
BUN: 37 mg/dL — ABNORMAL HIGH (ref 6–23)
CO2: 24 mEq/L (ref 19–32)
Chloride: 109 mEq/L (ref 96–112)
Creatinine, Ser: 0.88 mg/dL (ref 0.50–1.35)

## 2012-09-13 LAB — GLUCOSE, CAPILLARY: Glucose-Capillary: 184 mg/dL — ABNORMAL HIGH (ref 70–99)

## 2012-09-13 LAB — CBC
HCT: 27.9 % — ABNORMAL LOW (ref 39.0–52.0)
MCV: 86.6 fL (ref 78.0–100.0)
RBC: 3.22 MIL/uL — ABNORMAL LOW (ref 4.22–5.81)
WBC: 14.4 10*3/uL — ABNORMAL HIGH (ref 4.0–10.5)

## 2012-09-13 LAB — PROTIME-INR: INR: 2.75 — ABNORMAL HIGH (ref 0.00–1.49)

## 2012-09-13 MED ORDER — WARFARIN SODIUM 4 MG PO TABS
8.0000 mg | ORAL_TABLET | Freq: Once | ORAL | Status: AC
  Administered 2012-09-13: 8 mg via ORAL
  Filled 2012-09-13: qty 2

## 2012-09-13 MED ORDER — WARFARIN - PHARMACIST DOSING INPATIENT
Freq: Every day | Status: DC
  Administered 2012-09-14 (×2)

## 2012-09-13 MED ORDER — VANCOMYCIN HCL IN DEXTROSE 1-5 GM/200ML-% IV SOLN
1000.0000 mg | Freq: Two times a day (BID) | INTRAVENOUS | Status: DC
Start: 2012-09-13 — End: 2012-09-17
  Administered 2012-09-13 – 2012-09-17 (×8): 1000 mg via INTRAVENOUS
  Filled 2012-09-13 (×10): qty 200

## 2012-09-13 NOTE — Progress Notes (Signed)
Chaplain Note:  I have known patient through a common church context for over 25 years. He requested that I be with him when he met with Dr. Phillips Odor for a palliative care consult. Troy Cook is a man for whom having control of his life is best described as "very important."  He is clearly concerned about his health and whether he will be able to heal enough to have a quality of life which is acceptable, i.e., being able to be mobile, at home with his wife Troy Cook. His wife is very supportive of him and understands his personality. He was able to talk openly about some of his concerns about living as he is. He was able to allow me to be of support to him and to talk openly. I will continue to follow up with him both as a friend and as one of his Troy Cook[facility].  [phone]

## 2012-09-13 NOTE — Progress Notes (Addendum)
Physical Therapy Wound Treatment  Patient Details  Name: Troy Cook MRN: [id number] Date of Birth: December 28, 1931  Today's Date: 09/13/2012 Time: 4098-1191 Time Calculation (min): 42 min  Subjective     Pain Score:  "none really" at wound; reports pain in abd "cramping"  Spoke with RN re: order for air overlay mattress. She reports there was a problem with delivering it yesterday and it is to be delivered today.   Wound Assessment  Pressure Ulcer 09/10/12 Stage IV - Full thickness tissue loss with exposed bone, tendon or muscle. 3.5"x2"x1.5"  (Active)  State of Healing Early/partial granulation 09/13/2012  9:48 AM  Site / Wound Assessment Granulation tissue;Pink;Yellow;Other (Comment) 09/13/2012  9:48 AM  % Wound base Red or Granulating 80% 09/13/2012  9:48 AM  % Wound base Yellow 15% 09/13/2012  9:48 AM  % Wound base Black 5% 09/11/2012  3:00 PM  % Wound base Other (Comment) 5% 09/13/2012  9:48 AM  Peri-wound Assessment Intact;Erythema (blanchable);Other (Comment) 09/13/2012  9:48 AM  Wound Length (cm) 12.5 cm 09/12/2012 10:10 AM  Wound Width (cm) 6.5 cm 09/12/2012 10:10 AM  Wound Depth (cm) 4.3 cm 09/12/2012 10:10 AM  Tunneling (cm) .5 09/11/2012  3:00 PM  Undermining (cm) 2.0 09/12/2012 10:10 AM  Margins Unattacted edges (unapproximated) 09/13/2012  9:48 AM  Drainage Amount Minimal 09/13/2012  9:48 AM  Drainage Description No odor 09/13/2012  9:48 AM  Treatment Debridement (Selective);Hydrotherapy (Pulse lavage);Packing (Saline gauze);Tape changed 09/13/2012  9:48 AM  Dressing Type ABD;Barrier Film (skin prep);Gauze (Comment);Moist to moist;Tape dressing 09/13/2012  9:48 AM  Dressing Changed 09/13/2012  9:48 AM            Hydrotherapy Pulsed lavage therapy - wound location: sacrum Pulsed Lavage with Suction (psi):  (4-8 psi) Pulsed Lavage with Suction - Normal Saline Used: 1000 mL Pulsed Lavage Tip: Tip with splash shield Selective Debridement Selective Debridement - Location: sacrum Selective  Debridement - Tools Used: Forceps;Scissors Selective Debridement - Tissue Removed: yellow slough    Wound Assessment and Plan  Wound Therapy - Assess/Plan/Recommendations Wound Therapy - Clinical Statement: Sacral wound already showing progress. Able to remove more yellow slough today compared to 3/31. Feel pt will soon be ready for the VAC. Wound Therapy - Functional Problem List: limited positioning options due to decr skin integrity Factors Delaying/Impairing Wound Healing: Infection - systemic/local;Immobility;Multiple medical problems Hydrotherapy Plan: Debridement;Dressing change;Patient/family education;Pulsatile lavage with suction Wound Therapy - Frequency: 6X / week Wound Therapy - Current Recommendations: Nutritionist Wound Therapy - Follow Up Recommendations: Skilled nursing facility  Wound Therapy Goals- Improve the function of patient's integumentary system by progressing the wound(s) through the phases of wound healing (inflammation - proliferation - remodeling) by: Decrease Necrotic Tissue to: 10 Decrease Necrotic Tissue - Progress: Progressing toward goal Increase Granulation Tissue to: 90 Increase Granulation Tissue - Progress: Progressing toward goal Decrease Length/Width/Depth by (cm): .2 Decrease Length/Width/Depth - Progress: Progressing toward goal Patient/Family will be able to : verbalize proper positioning for pressure reduction to assist with healing Patient/Family Instruction Goal - Progress: Progressing toward goal Goals/treatment plan/discharge plan were made with and agreed upon by patient/family: Yes Wound Therapy - Potential for Goals: Fair  Goals will be updated until maximal potential achieved or discharge criteria met.  Discharge criteria: when goals achieved, discharge from hospital, MD decision/surgical intervention, no progress towards goals, refusal/missing three consecutive treatments without notification or medical reason.  GP      Janelly Switalski 09/13/2012, 9:58 AM  09/13/2012 Veda Canning, PT Pager: [phone]

## 2012-09-13 NOTE — Progress Notes (Signed)
  Echocardiogram 2D Echocardiogram has been performed.  Ellender Hose A 09/13/2012, 3:57 PM

## 2012-09-13 NOTE — Progress Notes (Signed)
ANTICOAGULATION CONSULT NOTE - Initial Consult  Pharmacy Consult for warfarin Indication: atrial fibrillation  No Known Allergies  Patient Measurements: Height: 5\' 11"  (180.3 cm) Weight: 226 lb 10.1 oz (102.799 kg) IBW/kg (Calculated) : 75.3  Vital Signs: Temp: 98 F (36.7 C) (04/01 1400) Temp src: Oral (04/01 1400) BP: 112/67 mmHg (04/01 1400) Pulse Rate: 112 (04/01 1400)  Labs:  Recent Labs  09/11/12 0625 09/13/12 0455 09/13/12 0945  HGB 9.4* 9.0*  --   HCT 27.4* 27.9*  --   PLT 337 322  --   LABPROT 39.3*  --  27.7*  INR 4.41*  --  2.75*  CREATININE 1.88* 0.88  --     Estimated Creatinine Clearance: 84.4 ml/min (by C-G formula based on Cr of 0.88).   Medical History: Past Medical History  Diagnosis Date  . Atrial fibrillation   . Diabetes mellitus   . Hypertension   . Coronary artery disease   . Hyperlipemia   . ICD (implantable cardiac defibrillator) in place   . CHF (congestive heart failure)     Medications:  Prescriptions prior to admission  Medication Sig Dispense Refill  . aspirin 81 MG chewable tablet Chew 81 mg by mouth daily.      . bisacodyl (DULCOLAX) 10 MG suppository Place 10 mg rectally daily as needed for constipation.      . carvedilol (COREG) 12.5 MG tablet Take 1 tablet (12.5 mg total) by mouth 2 (two) times daily with a meal.  60 tablet  6  . celecoxib (CELEBREX) 200 MG capsule Take 200 mg by mouth daily.      Marland Kitchen doxycycline (VIBRA-TABS) 100 MG tablet Take 1 tablet (100 mg total) by mouth 2 (two) times daily.  24 tablet  0  . DULoxetine (CYMBALTA) 30 MG capsule Take 1 capsule (30 mg total) by mouth daily.  30 capsule  3  . ferrous sulfate 325 (65 FE) MG tablet Take 325 mg by mouth daily with breakfast.      . furosemide (LASIX) 20 MG tablet Take 20 mg by mouth daily.      Marland Kitchen glipiZIDE (GLUCOTROL) 10 MG tablet Take 10 mg by mouth 2 (two) times daily before a meal.      . isosorbide mononitrate (IMDUR) 30 MG 24 hr tablet Take 30 mg by  mouth daily.      Marland Kitchen lisinopril (PRINIVIL,ZESTRIL) 20 MG tablet Take 20 mg by mouth 2 (two) times daily.      . magnesium hydroxide (MILK OF MAGNESIA) 400 MG/5ML suspension Take 30 mLs by mouth daily as needed for constipation.      . metFORMIN (GLUCOPHAGE) 500 MG tablet Take 500 mg by mouth 2 (two) times daily with a meal.      . methocarbamol (ROBAXIN) 500 MG tablet Take 1 tablet (500 mg total) by mouth 3 (three) times daily.  30 tablet  0  . Nutritional Supplements (RESOURCE PO) Take 90 mLs by mouth 2 (two) times daily.      Marland Kitchen OVER THE COUNTER MEDICATION Take by mouth 2 (two) times daily. Protein supplement powder, 2 scoops in milkshake      . polyethylene glycol (MIRALAX / GLYCOLAX) packet Take 17 g by mouth daily.      Marland Kitchen saccharomyces boulardii (FLORASTOR) 250 MG capsule Take 250 mg by mouth 2 (two) times daily. 3 week course - 09/01/12 thru 09/21/12      . simvastatin (ZOCOR) 20 MG tablet Take 20 mg by mouth every evening.      Marland Kitchen  sodium phosphate (FLEET) 7-19 GM/118ML ENEM Place 1 enema rectally as needed (after removing hard stool or if soft stool found in rectum).      . tamsulosin (FLOMAX) 0.4 MG CAPS Take 0.4 mg by mouth daily.      . traMADol (ULTRAM) 50 MG tablet Take 50-100 mg by mouth every 6 (six) hours as needed for pain (1 tablet for moderate pain, 2 tablets for severe pain).      . Warfarin Sodium (COUMADIN PO) Take 8 mg by mouth daily.      . furosemide (LASIX) 20 MG tablet Take 60 mg by mouth daily.        Assessment: 80 yof on chronic coumadin for afib to restart coumadin tonight. INR had been elevated likely due to a drug interaction with bactrim. He is no longer on bactrim. After multiple days of held therapy, INR today is therapeutic at 2.75. Coumadin was recently changed to 8mg  daily but it isn't completely clear what he was on prior to this.   Goal of Therapy:  INR 2-3   Plan:  1. Warfarin 8mg  PO x 1 tonight 2. Daily INR  Troy Cook, Troy Cook 09/13/2012,5:30 PM

## 2012-09-13 NOTE — Progress Notes (Signed)
[facility] PROGRESS NOTE  Troy Cook WUX:[id number] DOB: 1931-12-23 DOA: 09/10/2012 PCP: Daisy Floro, MD  Assessment/Plan: 1. Severe Sepsis - multifactorial - MRSA UTI, - patient most likely has staph aureus bacteremia which will  explain why he had persistent mrsa utis at [facility] priro to admission to [place].   Started empiric iv vancomycin and Zosyn on admission . Zosyn stopped 3/31 after culture results back. Continue Vancomycin iv . ID consultation called  2. SBO - probable distal obstruction - ? From constipation vs adhesions from prior surgery. Had NG tube, suctioning, bowel rest from 3/29-3/30 with minimal improvement and minimal fluid suctioned . NG tube removed 09/11/12.  patient is too ill for surgical intervention. Constipation resolved by 3/31 and SBO resolved .  3. AKI - multifactorial - suspect from direct infectious injury to the tubules after persistent staph aureus infection , combined with dehydration -  iv fluids and closely monitor status - improved by 09/11/12. Iv fluids stopped 09/12/12. Good UO.  4. Stage IV decub ulcer - air mattress overlay.  wound care consult obtained on 3/30 and patient started hydrotherapy 3/31.  5. Cardiomyopathy s/p AICD - last EF 35%. Cards consultation called  6. Hyperkalemia secondary to renal failure - resolved with D50 and insulin and hydration  7. Hyponatremia from dehydration - resolved  8. DM 2 - SSI  9. Afib with RVR - decompensated from sepsis - started low dose coreg.  treat pain and sepsis. Patient off all dose anticoagulation INR > 2 since admission. 10. Patient has multiple systems and organ failure, he has been deconditioned, bed bound prior to this admission.Palliative care consult called 3/30 No plans for pressors, ICU transfer, surgery,    Code Status: dnr  Family Communication: wife  Disposition Plan: snf     Consultants:  Palliative care   ID  Cardiology       Antibiotics: Vancomycin  3/29   HPI/Subjective: Feels better  Objective: Filed Vitals:   09/12/12 1400 09/12/12 1800 09/12/12 2031 09/13/12 0615  BP: 101/60 106/66 111/59 111/55  Pulse: 112 110 105 108  Temp: 98.2 F (36.8 C) 98.4 F (36.9 C) 97.5 F (36.4 C) 97.7 F (36.5 C)  TempSrc: Oral Oral Oral Oral  Resp: 16 18 18 18   Height:   5\' 11"  (1.803 m)   Weight:   102.799 kg (226 lb 10.1 oz)   SpO2: 100% 100% 100% 98%   Patient Vitals for the past 24 hrs:  BP Temp Temp src Pulse Resp SpO2 Height Weight  09/13/12 0615 111/55 mmHg 97.7 F (36.5 C) Oral 108 18 98 % - -  09/12/12 2031 111/59 mmHg 97.5 F (36.4 C) Oral 105 18 100 % 5\' 11"  (1.803 m) 102.799 kg (226 lb 10.1 oz)  09/12/12 1800 106/66 mmHg 98.4 F (36.9 C) Oral 110 18 100 % - -  09/12/12 1400 101/60 mmHg 98.2 F (36.8 C) Oral 112 16 100 % - -  09/12/12 1000 94/54 mmHg 98 F (36.7 C) Oral 114 18 100 % - -     Intake/Output Summary (Last 24 hours) at 09/13/12 0838 Last data filed at 09/13/12 0616  Gross per 24 hour  Intake   1560 ml  Output    900 ml  Net    660 ml   Filed Weights   09/11/12 1539 09/11/12 2218 09/12/12 2031  Weight: 100.5 kg (221 lb 9 oz) 102.8 kg (226 lb 10.1 oz) 102.799 kg (226 lb 10.1 oz)    Exam:  General:  Awake, eating breakfast   Cardiovascular: irreg irreg   Respiratory: clear bilaterally  Abdomen: not distended, no further  LLQ tenderness   Musculoskeletal: diminished muscle bulk and tone    Data Reviewed: Basic Metabolic Panel:  Recent Labs Lab 09/10/12 1004 09/11/12 0625 09/13/12 0455  NA 130* 137 141  K 6.0* 4.4 4.5  CL 95* 105 109  CO2 18* 20 24  GLUCOSE 219* 138* 130*  BUN 104* 82* 37*  CREATININE 3.38* 1.88* 0.88  CALCIUM 9.2 8.5 8.3*   Liver Function Tests:  Recent Labs Lab 09/10/12 1004  AST 8  ALT 9  ALKPHOS 92  BILITOT 0.3  PROT 7.7  ALBUMIN 2.1*    Recent Labs Lab 09/10/12 1004  LIPASE 15   No results found for this basename: AMMONIA,  in the last 168  hours CBC:  Recent Labs Lab 09/10/12 1004 09/11/12 0625 09/13/12 0455  WBC 45.4* 26.2* 14.4*  NEUTROABS 43.6*  --   --   HGB 11.6* 9.4* 9.0*  HCT 33.4* 27.4* 27.9*  MCV 81.3 82.8 86.6  PLT 427* 337 322   Cardiac Enzymes: No results found for this basename: CKTOTAL, CKMB, CKMBINDEX, TROPONINI,  in the last 168 hours BNP (last 3 results)  Recent Labs  10/26/11 1440  PROBNP 2287.0*   CBG:  Recent Labs Lab 09/12/12 0415 09/12/12 1233 09/12/12 1712 09/12/12 2347 09/13/12 0609  GLUCAP 122* 138* 162* 180* 145*    Recent Results (from the past 240 hour(s))  URINE CULTURE     Status: None   Collection Time    09/10/12 11:39 AM      Result Value Range Status   Specimen Description URINE, CATHETERIZED   Final   Special Requests NONE   Final   Culture  Setup Time 09/10/2012 17:34   Final   Colony Count >=100,000 COLONIES/ML   Final   Culture     Final   Value: METHICILLIN RESISTANT STAPHYLOCOCCUS AUREUS     Note: RIFAMPIN AND GENTAMICIN SHOULD NOT BE USED AS SINGLE DRUGS FOR TREATMENT OF STAPH INFECTIONS. CRITICAL RESULT CALLED TO, READ BACK BY AND VERIFIED WITH: TY H@1156  ON [id number] BY [facility]   Report Status 09/12/2012 FINAL   Final   Organism ID, Bacteria METHICILLIN RESISTANT STAPHYLOCOCCUS AUREUS   Final  MRSA PCR SCREENING     Status: None   Collection Time    09/10/12  1:49 PM      Result Value Range Status   MRSA by PCR NEGATIVE  NEGATIVE Final   Comment:            The GeneXpert MRSA Assay (FDA     approved for NASAL specimens     only), is one component of a     comprehensive MRSA colonization     surveillance program. It is not     intended to diagnose MRSA     infection nor to guide or     monitor treatment for     MRSA infections.     Studies: No results found.  Scheduled Meds: . antiseptic oral rinse  15 mL Mouth Rinse q12n4p  . carvedilol  3.125 mg Oral BID WC  . feeding supplement  30 mL Oral BID  . feeding supplement  1 Container Oral TID WC   . insulin aspart  0-9 Units Subcutaneous Q6H  . metoCLOPramide (REGLAN) injection  10 mg Intravenous Q6H  . multivitamin with minerals  1 tablet Oral Daily  . nutrition supplement  1 packet Oral BID BM  . vancomycin  1,000 mg Intravenous Q12H   Continuous Infusions:    Principal Problem:   Sepsis Active Problems:   Normocytic anemia   Dyslipidemia   Atrial fibrillation   ICD (implantable cardiac defibrillator) in place   Chronic systolic heart failure   Pressure ulcer stage IV   UTI (urinary tract infection)   Hyperkalemia   SBO (small bowel obstruction)   AKI (acute kidney injury)   MRSA infection      Jona Zappone  [facility] Pager [phone]. If 7PM-7AM, please contact night-coverage at [email or web address], password [facility] 09/13/2012, 8:38 AM  LOS: 3 days

## 2012-09-13 NOTE — Consult Note (Addendum)
ELECTROPHYSIOLOGY CONSULT NOTE  Patient ID: Troy Cook MRN: [id number], DOB/AGE: May 15, 1932   Admit date: 09/10/2012 Date of Consult: 09/13/2012  Primary Physician: Daisy Floro, MD Primary Cardiologist: Everette Rank, MD Primary EP: Lewayne Bunting, MD Reason for Consultation: Bacteremia; ? need for device extraction  History of Present Illness Troy Cook is a 78 year old man with a longstanding ICM s/p ICD implant for primary prevention SCD in June 2013, chronic systolic HF, AF, DM and HTN who has been admitted with sepsis, found to have MRSA bacteremia. We have been asked to see him and provide EP recommendations regarding need for device extraction. Troy Cook reports he began feeling poor in early February at which time he was diagnosed with a UTI (e coli) and placed on antibiotics by the resident RN/MD at the [facility] where he was living independently with his wife. Over the course of 3-4 days, he experienced progressive weakness to the point where he could not perform ADLs without help. He was then transferred to the skilled nursing center at [facility] where he has been bed-bound. He denies CP, worsening SOB, palpitations, dizziness or syncope. He denies LE swelling, orthopnea or PND. He denies ICD shocks. He has had recurrent UTI's growing out MRSA.   Per primary team, Troy Cook has multisystem and organ failure. He has been deconditioned and bed-bound prior to this admission. His prognosis is very poor. Wife has communicated intention to pursue comfort care if he does not recover. Palliative care consult done. Troy Cook is now DNR. No plans for pressors, ICU transfer or surgery.  Past Medical History Past Medical History  Diagnosis Date  . Atrial fibrillation   . Diabetes mellitus   . Hypertension   . Coronary artery disease   . Hyperlipemia   . ICD (implantable cardiac defibrillator) in place   . CHF (congestive heart failure)     Past Surgical History Past Surgical  History  Procedure Laterality Date  . Cholecystectomy    . Cardiac defibrillator placement  11/19/2011    single chamber  . Cardiac catheterization       Allergies/Intolerances No Known Allergies  Inpatient Medications . antiseptic oral rinse  15 mL Mouth Rinse q12n4p  . carvedilol  3.125 mg Oral BID WC  . feeding supplement  30 mL Oral BID  . feeding supplement  1 Container Oral TID WC  . insulin aspart  0-9 Units Subcutaneous Q6H  . metoCLOPramide (REGLAN) injection  10 mg Intravenous Q6H  . multivitamin with minerals  1 tablet Oral Daily  . nutrition supplement  1 packet Oral BID BM  . vancomycin  1,000 mg Intravenous Q12H    Family History Positive for CAD   Social History Social History  . Marital Status: Married   Social History Main Topics  . Smoking status: Former Smoker    Quit date: 02/13/2001  . Smokeless tobacco: Never Used  . Alcohol Use: No  . Drug Use: No   Review of Systems General: +fever  No night sweats or weight changes  Cardiovascular: No chest pain, dyspnea on exertion, edema, orthopnea, palpitations, paroxysmal nocturnal dyspnea Dermatological: No rash, lesions or masses Respiratory: No cough, dyspnea Urologic: No hematuria, dysuria Abdominal: +nausea +abdominal pain  No vomiting, diarrhea, bright red blood per rectum, melena, or hematemesis Neurologic: +generalized weakness  No visual changes, changes in mental status All other systems reviewed and are otherwise negative except as noted above.  Physical Exam Blood pressure 107/61, pulse 130, temperature 98.7  F (37.1 C), temperature source Oral, resp. rate 18, height 5\' 11"  (1.803 m), weight 226 lb 10.1 oz (102.799 kg), SpO2 96.00%.  General: Well developed, ill appearing 79 year old male in no acute distress. HEENT: Normocephalic, atraumatic. EOMs intact. Sclera nonicteric. Oropharynx clear.  Neck: Supple. No JVD. Lungs: Respirations regular and unlabored, CTA bilaterally. No wheezes,  rales or rhonchi. Heart: RRR. S1, S2 present. No murmurs, rub, S3 or S4. Abdomen: Soft, non-distended. BS present x 4 quadrants.  Extremities: No clubbing, cyanosis or edema. DP/PT/Radials 2+ and equal bilaterally. Psych: Normal affect. Neuro: Alert and oriented X 3. Moves all extremities spontaneously. Musculoskeletal: No kyphosis. Skin: Intact. Warm and dry. No rashes or petechiae in exposed areas.   Labs Lab Results  Component Value Date   WBC 14.4* 09/13/2012   HGB 9.0* 09/13/2012   HCT 27.9* 09/13/2012   MCV 86.6 09/13/2012   PLT 322 09/13/2012    Recent Labs Lab 09/10/12 1004  09/13/12 0455  NA 130*  < > 141  K 6.0*  < > 4.5  CL 95*  < > 109  CO2 18*  < > 24  BUN 104*  < > 37*  CREATININE 3.38*  < > 0.88  CALCIUM 9.2  < > 8.3*  PROT 7.7  --   --   BILITOT 0.3  --   --   ALKPHOS 92  --   --   ALT 9  --   --   AST 8  --   --   GLUCOSE 219*  < > 130*  < > = values in this interval not displayed.   Recent Labs  09/11/12 0625  INR 4.41*    Radiology/Studies No recent CXR  Echocardiogram  Pending  12-lead ECG on admission shows rapid AF at 132 bpm with RBBB Telemetry   Assessment and Plan 1. ICM s/p ICD implant - will interrogate today; await results of 2-D echo and TEE; Dr. Ladona Ridgel will discuss care with his primary cardiologist, Dr. Eldridge Dace, who can perform TEE 2. MRSA UTI 3. Sepsis - suspected MRSA bacteremia, on Vanc - per primary team;  ID consulted  2. SBO - severe constipation related ileus due to immobility and opiates; resolved 3. Acute renal failure - multifactorial - suspect from persistent staph bacteremia and dehydration - improving with IVFs 4. Stage IV decub ulcer 5. AF with RVR - difficult rate control due to acute illness and limited by hypotension - primary team restarted low dose Coreg today; continue treating pain/sepsis and HR should improve. On warfarin with supratherapeutic INR.   Signed, Rick Duff, PA-C 09/13/2012, 11:03 AM  EP  attending  Patient seen and examined. I have reviewed the history. The patient is 79 years old and underwent ICD implantation approximately 9 months ago. He has had recurrent infectious symptoms beginning with a UTI in February and documented urinary tract infections. He has grown out Escherichia coli as well as MRSA in his urine. It is unclear whether he has actually grown MRSA in his blood stream. My understanding is that he has not, although he has had an infectious symptoms worrisome for sepsis. I reviewed Dr. Lattie Haw note. Blood cultures are currently pending. A transesophageal echo demonstrated no obvious vegetation. At this point, ICD system extraction is not indicated unless he has MRSA in his blood stream. MRSA in his urine would not be an indication for device extraction. We will follow with you and would consider removal of his device for positive blood cultures.  Lewayne Bunting, M.D.

## 2012-09-13 NOTE — Consult Note (Addendum)
[facility]    Date of Admission:  09/10/2012  Date of Consult:  09/13/2012  Reason for Consult: Occult Staphylococcus aureus bacteremia Referring Physician: Dr. Lavera Guise   HPI: Troy Cook is an 79 y.o. male with PMHX . 4 diabetes mellitus atrial fibrillation congestive heart failure with an ICD in place who apparently had a decline in February when he succumbed to an infection in his bladder with Escherichia coli. He had to be transferred to the more intensive part of his skilled nursing facility and did improve while there. Then he then developed a sensation of fullness in his bladder and felt that he was developing a urinary tract infection. Did not have overt dysuria at the time. Apparently urine analysis and culture were obtained in the nursing facility and the culture he did yielded methicillin resistant staph aureus. Note no simultaneous blood cultures were done at the time urine cultures were obtained. The patient was placed on Bactrim twice daily and apparently he again improved and then deteriorated once again and had his urine sample for culture and was started again on Bactrim after yet again MRSA was isolated from urine. Ultimately did her the point where he become confused bedbound and severely weakened. He was admitted to the hospitalist service on March 29. Urine was cultured yet again it yet again grew methicillin-resistant staph aureus. Unfortunately simultaneous blood cultures were yet again not obtained. The patient was placed on vancomycin has improved dramatically with vancomycin and IV fluid hydration.  We're consulted due to concern that this patient may have been having occult methicillin-resistant staph aureus bacteremia which was likely seeding his urine. This is of particularly high concern given the factor the patient does have an ICD in place. He is also developed a stage IV decubitus ulcer. He claims at this site has developed very near a place  where he had a pilonidal cyst when he was age of 76. Wound care found it to be 6cm x 5cm x 5cm with pink , moist, non-granulating  Drainage (amount, consistency, odor) moderate amount of serosanguinous drainage --He DID NOT wish for me to examine it today as it had just been packed.  We spent greater than 60 minutes with the patient including greater than 50% of time in face to face counsel of the patient , his wife and in coordination of their care.       Past Medical History  Diagnosis Date  . Atrial fibrillation   . Diabetes mellitus   . Hypertension   . Coronary artery disease   . Hyperlipemia   . ICD (implantable cardiac defibrillator) in place   . CHF (congestive heart failure)     Past Surgical History  Procedure Laterality Date  . Cholecystectomy    . Cardiac defibrillator placement  11/19/2011    single chamber  . Cardiac catheterization    ergies:   No Known Allergies   Medications: I have reviewed patients current medications as documented in Epic Anti-infectives   Start     Dose/Rate Route Frequency Ordered Stop   09/13/12 1000  vancomycin (VANCOCIN) IVPB 1000 mg/200 mL premix     1,000 mg 200 mL/hr over 60 Minutes Intravenous Every 12 hours 09/13/12 0829     09/11/12 1400  piperacillin-tazobactam (ZOSYN) IVPB 3.375 g  Status:  Discontinued     3.375 g 12.5 mL/hr over 240 Minutes Intravenous 3 times per day 09/11/12 1111 09/12/12 0831   09/11/12 1400  vancomycin (VANCOCIN)  IVPB 1000 mg/200 mL premix  Status:  Discontinued     1,000 mg 200 mL/hr over 60 Minutes Intravenous Every 24 hours 09/11/12 1123 09/13/12 0829   09/10/12 1400  vancomycin (VANCOCIN) 1,500 mg in sodium chloride 0.9 % 500 mL IVPB     1,500 mg 250 mL/hr over 120 Minutes Intravenous  Once 09/10/12 1223 09/10/12 1534   09/10/12 1300  piperacillin-tazobactam (ZOSYN) IVPB 2.25 g  Status:  Discontinued     2.25 g 100 mL/hr over 30 Minutes Intravenous 3 times per day 09/10/12 1223 09/11/12  1111      Social History:  reports that he quit smoking about 11 years ago. He has never used smokeless tobacco. He reports that he does not drink alcohol or use illicit drugs.  No family history on file.  As in HPI and primary teams notes otherwise 12 point review of systems is negative  Blood pressure 112/67, pulse 112, temperature 98 F (36.7 C), temperature source Oral, resp. rate 18, height 5\' 11"  (1.803 m), weight 226 lb 10.1 oz (102.799 kg), SpO2 96.00%. General: Alert and awake, oriented x3, not in any acute distress. HEENT: anicteric sclera, pupils reactive to light and accommodation, EOMI, oropharynx clear and without exudate CVS irr, irr, normal r,  no murmur rubs or gallops Chest: clear to auscultation bilaterally, no wheezing, rales or rhonchi Abdomen: soft nontender, nondistended, normal bowel sounds, Extremities: no  clubbing or edema noted bilaterally Skin: Decubitus is packed Neuro: nonfocal,   Results for orders placed during the hospital encounter of 09/10/12 (from the past 48 hour(s))  GLUCOSE, CAPILLARY     Status: Abnormal   Collection Time    09/11/12  5:53 PM      Result Value Range   Glucose-Capillary 141 (*) 70 - 99 mg/dL  GLUCOSE, CAPILLARY     Status: Abnormal   Collection Time    09/12/12 12:39 AM      Result Value Range   Glucose-Capillary 125 (*) 70 - 99 mg/dL  GLUCOSE, CAPILLARY     Status: Abnormal   Collection Time    09/12/12  4:15 AM      Result Value Range   Glucose-Capillary 122 (*) 70 - 99 mg/dL  GLUCOSE, CAPILLARY     Status: Abnormal   Collection Time    09/12/12 12:33 PM      Result Value Range   Glucose-Capillary 138 (*) 70 - 99 mg/dL  GLUCOSE, CAPILLARY     Status: Abnormal   Collection Time    09/12/12  5:12 PM      Result Value Range   Glucose-Capillary 162 (*) 70 - 99 mg/dL  GLUCOSE, CAPILLARY     Status: Abnormal   Collection Time    09/12/12 11:47 PM      Result Value Range   Glucose-Capillary 180 (*) 70 - 99 mg/dL    CBC     Status: Abnormal   Collection Time    09/13/12  4:55 AM      Result Value Range   WBC 14.4 (*) 4.0 - 10.5 K/uL   RBC 3.22 (*) 4.22 - 5.81 MIL/uL   Hemoglobin 9.0 (*) 13.0 - 17.0 g/dL   HCT 16.1 (*) 09.6 - 04.5 %   MCV 86.6  78.0 - 100.0 fL   MCH 28.0  26.0 - 34.0 pg   MCHC 32.3  30.0 - 36.0 g/dL   RDW 40.9 (*) 81.1 - 91.4 %   Platelets 322  150 - 400 K/uL  BASIC METABOLIC PANEL     Status: Abnormal   Collection Time    09/13/12  4:55 AM      Result Value Range   Sodium 141  135 - 145 mEq/L   Potassium 4.5  3.5 - 5.1 mEq/L   Chloride 109  96 - 112 mEq/L   CO2 24  19 - 32 mEq/L   Glucose, Bld 130 (*) 70 - 99 mg/dL   BUN 37 (*) 6 - 23 mg/dL   Comment: DELTA CHECK NOTED   Creatinine, Ser 0.88  0.50 - 1.35 mg/dL   Comment: DELTA CHECK NOTED   Calcium 8.3 (*) 8.4 - 10.5 mg/dL   GFR calc non Af Amer 80 (*) >90 mL/min   GFR calc Af Amer >90  >90 mL/min   Comment:            The eGFR has been calculated     using the CKD EPI equation.     This calculation has not been     validated in all clinical     situations.     eGFR's persistently     <90 mL/min signify     possible Chronic Kidney Disease.  GLUCOSE, CAPILLARY     Status: Abnormal   Collection Time    09/13/12  6:09 AM      Result Value Range   Glucose-Capillary 145 (*) 70 - 99 mg/dL  PROTIME-INR     Status: Abnormal   Collection Time    09/13/12  9:45 AM      Result Value Range   Prothrombin Time 27.7 (*) 11.6 - 15.2 seconds   INR 2.75 (*) 0.00 - 1.49  GLUCOSE, CAPILLARY     Status: Abnormal   Collection Time    09/13/12 12:52 PM      Result Value Range   Glucose-Capillary 184 (*) 70 - 99 mg/dL      Component Value Date/Time   SDES URINE, CATHETERIZED 09/10/2012 1139   SPECREQUEST NONE 09/10/2012 1139   CULT  Value: METHICILLIN RESISTANT STAPHYLOCOCCUS AUREUS Note: RIFAMPIN AND GENTAMICIN SHOULD NOT BE USED AS SINGLE DRUGS FOR TREATMENT OF STAPH INFECTIONS. CRITICAL RESULT CALLED TO, READ BACK BY AND  VERIFIED WITH: TY H@1156  ON [id number] BY [facility] 09/10/2012 1139   REPTSTATUS 09/12/2012 FINAL 09/10/2012 1139   No results found.   Recent Results (from the past 720 hour(s))  URINE CULTURE     Status: None   Collection Time    09/10/12 11:39 AM      Result Value Range Status   Specimen Description URINE, CATHETERIZED   Final   Special Requests NONE   Final   Culture  Setup Time 09/10/2012 17:34   Final   Colony Count >=100,000 COLONIES/ML   Final   Culture     Final   Value: METHICILLIN RESISTANT STAPHYLOCOCCUS AUREUS     Note: RIFAMPIN AND GENTAMICIN SHOULD NOT BE USED AS SINGLE DRUGS FOR TREATMENT OF STAPH INFECTIONS. CRITICAL RESULT CALLED TO, READ BACK BY AND VERIFIED WITH: TY H@[id number]  ON [id number] BY [facility]   Report Status 09/12/2012 FINAL   Final   Organism ID, Bacteria METHICILLIN RESISTANT STAPHYLOCOCCUS AUREUS   Final  MRSA PCR SCREENING     Status: None   Collection Time    09/10/12  1:49 PM      Result Value Range Status   MRSA by PCR NEGATIVE  NEGATIVE Final   Comment:  The GeneXpert MRSA Assay (FDA     approved for NASAL specimens     only), is one component of a     comprehensive MRSA colonization     surveillance program. It is not     intended to diagnose MRSA     infection nor to guide or     monitor treatment for     MRSA infections.     Impression/Recommendation  79 year old man with MRSA having and recovered on multiple occasions from his urine raising the specter of an occult undiagnosed methicillin-resistant staph aureus bacteremia. He was admitted with sepsis and multi-organ disease, including confusion and renal insufficiency. He certainly has a large decubitus ulcer which could be the source of his bacteremia. He also has an ICD complicating the management of such bacteremia should the ICD be infected.  #1 Likely occult MRSA bacteremia: --agree with blood cultures today --agree with Consult to EP and Cardiology  --would pursue TEE  --I need to  examine his wound closely --given ICD presence and likely occult bacteremia, I would very likely want to treat for 4 weeks with IV vancomycin EVEN if the ICD does not appear infected, --If the ICD has vegetations it would need to be extracted and he would need 4-6 weeks of abx prior to remiplantation if cure was the goal   #2 Stage IV decubitus ulcer: I need to examine this but given depth osteo is high likelihood,  --will consider imaging --will check ESR and CRP --wound care  Thank you so much for this interesting consult  [facility] [phone] (pager) [phone] (office) 09/13/2012, 2:54 PM  Paulette Blanch Dam 09/13/2012, 2:54 PM

## 2012-09-13 NOTE — Clinical Social Work Note (Signed)
CSW talked with Elsie Lincoln, SW [facility]. She advised that patient is from their skilled facility. SW given update on patient progress. Ms. Camila Li advised that patient is not holding a bed, but she should have something available when patient ready for discharge. CSW informed SW that she will be kept informed of patient progress and readiness for discharge.  Genelle Bal, MSW, LCSW [phone]

## 2012-09-13 NOTE — Progress Notes (Signed)
ANTIBIOTIC CONSULT NOTE - FOLLOW UP  Pharmacy Consult for Vancomycin Indication: MRSA UTI, r/o bacteremia  No Known Allergies  Patient Measurements: Height: 5\' 11"  (180.3 cm) Weight: 226 lb 10.1 oz (102.799 kg) IBW/kg (Calculated) : 75.3  Vital Signs: Temp: 97.7 F (36.5 C) (04/01 0615) Temp src: Oral (04/01 0615) BP: 111/55 mmHg (04/01 0615) Pulse Rate: 108 (04/01 0615) Intake/Output from previous day: 03/31 0701 - 04/01 0700 In: 1560 [P.O.:1560] Out: 900 [Urine:900] Intake/Output from this shift:    Labs:  Recent Labs  09/10/12 1004 09/11/12 0625 09/13/12 0455  WBC 45.4* 26.2* 14.4*  HGB 11.6* 9.4* 9.0*  PLT 427* 337 322  CREATININE 3.38* 1.88* 0.88   Estimated Creatinine Clearance: 84.4 ml/min (by C-G formula based on Cr of 0.88). No results found for this basename: VANCOTROUGH, Leodis Binet, VANCORANDOM, GENTTROUGH, GENTPEAK, GENTRANDOM, TOBRATROUGH, TOBRAPEAK, TOBRARND, AMIKACINPEAK, AMIKACINTROU, AMIKACIN,  in the last 72 hours   Microbiology: Recent Results (from the past 720 hour(s))  URINE CULTURE     Status: None   Collection Time    09/10/12 11:39 AM      Result Value Range Status   Specimen Description URINE, CATHETERIZED   Final   Special Requests NONE   Final   Culture  Setup Time 09/10/2012 17:34   Final   Colony Count >=100,000 COLONIES/ML   Final   Culture     Final   Value: METHICILLIN RESISTANT STAPHYLOCOCCUS AUREUS     Note: RIFAMPIN AND GENTAMICIN SHOULD NOT BE USED AS SINGLE DRUGS FOR TREATMENT OF STAPH INFECTIONS. CRITICAL RESULT CALLED TO, READ BACK BY AND VERIFIED WITH: TY H@1156  ON [id number] BY [facility]   Report Status 09/12/2012 FINAL   Final   Organism ID, Bacteria METHICILLIN RESISTANT STAPHYLOCOCCUS AUREUS   Final  MRSA PCR SCREENING     Status: None   Collection Time    09/10/12  1:49 PM      Result Value Range Status   MRSA by PCR NEGATIVE  NEGATIVE Final   Comment:            The GeneXpert MRSA Assay (FDA     approved for NASAL  specimens     only), is one component of a     comprehensive MRSA colonization     surveillance program. It is not     intended to diagnose MRSA     infection nor to guide or     monitor treatment for     MRSA infections.    Anti-infectives   Start     Dose/Rate Route Frequency Ordered Stop   09/13/12 1000  vancomycin (VANCOCIN) IVPB 1000 mg/200 mL premix     1,000 mg 200 mL/hr over 60 Minutes Intravenous Every 12 hours 09/13/12 0829     09/11/12 1400  piperacillin-tazobactam (ZOSYN) IVPB 3.375 g  Status:  Discontinued     3.375 g 12.5 mL/hr over 240 Minutes Intravenous 3 times per day 09/11/12 1111 09/12/12 0831   09/11/12 1400  vancomycin (VANCOCIN) IVPB 1000 mg/200 mL premix  Status:  Discontinued     1,000 mg 200 mL/hr over 60 Minutes Intravenous Every 24 hours 09/11/12 1123 09/13/12 0829   09/10/12 1400  vancomycin (VANCOCIN) 1,500 mg in sodium chloride 0.9 % 500 mL IVPB     1,500 mg 250 mL/hr over 120 Minutes Intravenous  Once 09/10/12 1223 09/10/12 1534   09/10/12 1300  piperacillin-tazobactam (ZOSYN) IVPB 2.25 g  Status:  Discontinued     2.25 g 100 mL/hr over  30 Minutes Intravenous 3 times per day 09/10/12 1223 09/11/12 1111      Assessment: 79 y.o. M who continues on Vancomycin for confirmed MRSA UTI this admission. The patient is noted to also have a prior positive MRSA urine culture as an outpatient and was treated previously with Bactrim. Given that MRSA is not a natural colonizer/pathogen of the urine and that the patient has a hx of a prior ICD -- it is likely that this patient will need to undergo additional work-up to r/o bacteremia vs infective endocarditis (discussed with Dr. Lavera Guise)  The patient was noted to have acute renal failure on admit which is now resolving. SCr was 3.38 on admit and has now trended down to 0.88, estimated CrCl~60-65 ml/min. Given the improvement in renal function, will go ahead and adjust Vancomycin dose today to target goal trough  range.  Goal of Therapy:  Vancomycin trough level 15-20 mcg/ml  Plan:  1. Increase Vancomycin to 1g IV every 12 hours 2. Will continue to follow renal function, culture results, LOT, and antibiotic de-escalation plans   Georgina Pillion, PharmD, BCPS Clinical Pharmacist Pager: [phone] 09/13/2012 8:46 AM

## 2012-09-14 ENCOUNTER — Encounter (HOSPITAL_COMMUNITY)
Admission: EM | Disposition: A | Discharge status: Skilled Nursing Facility | Payer: Self-pay | Source: Home / Self Care | Attending: Internal Medicine

## 2012-09-14 ENCOUNTER — Encounter (HOSPITAL_COMMUNITY): Payer: Self-pay | Admitting: *Deleted

## 2012-09-14 DIAGNOSIS — E119 Type 2 diabetes mellitus without complications: Secondary | ICD-10-CM

## 2012-09-14 DIAGNOSIS — M545 Low back pain: Secondary | ICD-10-CM

## 2012-09-14 DIAGNOSIS — Z515 Encounter for palliative care: Secondary | ICD-10-CM

## 2012-09-14 DIAGNOSIS — R7881 Bacteremia: Secondary | ICD-10-CM

## 2012-09-14 DIAGNOSIS — K5900 Constipation, unspecified: Secondary | ICD-10-CM

## 2012-09-14 DIAGNOSIS — K56609 Unspecified intestinal obstruction, unspecified as to partial versus complete obstruction: Secondary | ICD-10-CM

## 2012-09-14 HISTORY — PX: TEE WITHOUT CARDIOVERSION: SHX5443

## 2012-09-14 LAB — PROTIME-INR: Prothrombin Time: 18.7 seconds — ABNORMAL HIGH (ref 11.6–15.2)

## 2012-09-14 LAB — GLUCOSE, CAPILLARY
Glucose-Capillary: 158 mg/dL — ABNORMAL HIGH (ref 70–99)
Glucose-Capillary: 165 mg/dL — ABNORMAL HIGH (ref 70–99)

## 2012-09-14 SURGERY — ECHOCARDIOGRAM, TRANSESOPHAGEAL
Anesthesia: Moderate Sedation

## 2012-09-14 MED ORDER — POLYETHYLENE GLYCOL 3350 17 G PO PACK
17.0000 g | PACK | Freq: Every day | ORAL | Status: DC
  Administered 2012-09-15: 17 g via ORAL
  Filled 2012-09-14 (×3): qty 1

## 2012-09-14 MED ORDER — LIDOCAINE VISCOUS 2 % MT SOLN
OROMUCOSAL | Status: AC
  Filled 2012-09-14: qty 15

## 2012-09-14 MED ORDER — LIDOCAINE VISCOUS 2 % MT SOLN
OROMUCOSAL | Status: DC | PRN
  Administered 2012-09-14: 10 mL via OROMUCOSAL

## 2012-09-14 MED ORDER — CARVEDILOL 6.25 MG PO TABS
6.2500 mg | ORAL_TABLET | Freq: Two times a day (BID) | ORAL | Status: DC
  Administered 2012-09-15 – 2012-09-16 (×3): 6.25 mg via ORAL
  Filled 2012-09-14 (×7): qty 1

## 2012-09-14 MED ORDER — ENOXAPARIN SODIUM 100 MG/ML ~~LOC~~ SOLN
100.0000 mg | Freq: Two times a day (BID) | SUBCUTANEOUS | Status: DC
  Administered 2012-09-14 – 2012-09-16 (×5): 100 mg via SUBCUTANEOUS
  Filled 2012-09-14 (×9): qty 1

## 2012-09-14 MED ORDER — MORPHINE SULFATE 2 MG/ML IJ SOLN
1.0000 mg | INTRAMUSCULAR | Status: DC | PRN
  Administered 2012-09-15 – 2012-09-17 (×7): 1 mg via INTRAVENOUS
  Filled 2012-09-14 (×10): qty 1

## 2012-09-14 MED ORDER — SODIUM CHLORIDE 0.9 % IV SOLN
INTRAVENOUS | Status: DC
  Administered 2012-09-14: 10 mL/h via INTRAVENOUS

## 2012-09-14 MED ORDER — MIDAZOLAM HCL 5 MG/ML IJ SOLN
INTRAMUSCULAR | Status: AC
Start: 2012-09-14 — End: 2012-09-14
  Filled 2012-09-14: qty 2

## 2012-09-14 MED ORDER — MIDAZOLAM HCL 10 MG/2ML IJ SOLN
INTRAMUSCULAR | Status: DC | PRN
  Administered 2012-09-14 (×3): 1 mg via INTRAVENOUS

## 2012-09-14 MED ORDER — SODIUM CHLORIDE 0.9 % IV SOLN
INTRAVENOUS | Status: AC
  Administered 2012-09-14: 100 mL/h via INTRAVENOUS

## 2012-09-14 MED ORDER — FENTANYL CITRATE 0.05 MG/ML IJ SOLN
INTRAMUSCULAR | Status: AC
  Filled 2012-09-14: qty 2

## 2012-09-14 MED ORDER — WARFARIN SODIUM 4 MG PO TABS
8.0000 mg | ORAL_TABLET | Freq: Once | ORAL | Status: AC
  Administered 2012-09-14: 8 mg via ORAL
  Filled 2012-09-14: qty 2

## 2012-09-14 MED ORDER — FENTANYL CITRATE 0.05 MG/ML IJ SOLN
INTRAMUSCULAR | Status: DC | PRN
  Administered 2012-09-14: 25 ug via INTRAVENOUS
  Administered 2012-09-14: 12.5 ug via INTRAVENOUS

## 2012-09-14 NOTE — Progress Notes (Signed)
Pt has returned from procedure, A&O x 4, no cx of pain or worsening sx. Tolerated sprite in Endo, MD notified.

## 2012-09-14 NOTE — Progress Notes (Signed)
Pt bp 88/22 auto in left forearm. Rechecked right forearm pt bp 88/33 auto. Rechecked manual bp 76/32, HR 108-118, MD notified. Will continue to monitor. Pt is A&O, RR 22, no cx at this time.

## 2012-09-14 NOTE — Progress Notes (Signed)
MD orders to hold coreg, recheck in 30 min and update. Will continue to monitor.

## 2012-09-14 NOTE — Progress Notes (Signed)
ANTIBIOTIC CONSULT NOTE - FOLLOW UP  Pharmacy Consult for Vancomycin Indication: MRSA UTI, r/o bacteremia  No Known Allergies  Patient Measurements: Height: 5\' 11"  (180.3 cm) Weight: 226 lb 10.1 oz (102.799 kg) IBW/kg (Calculated) : 75.3  Vital Signs: Temp: 98.1 F (36.7 C) (04/02 2017) Temp src: Oral (04/02 2017) BP: 82/39 mmHg (04/02 2017) Pulse Rate: 109 (04/02 2017) Intake/Output from previous day: 04/01 0701 - 04/02 0700 In: 540 [P.O.:540] Out: 2850 [Urine:2850] Intake/Output from this shift:    Labs:  Recent Labs  09/13/12 0455  WBC 14.4*  HGB 9.0*  PLT 322  CREATININE 0.88   Estimated Creatinine Clearance: 84.4 ml/min (by C-G formula based on Cr of 0.88).  Recent Labs  09/14/12 2144  VANCOTROUGH 20.6*     Microbiology: Recent Results (from the past 720 hour(s))  URINE CULTURE     Status: None   Collection Time    09/10/12 11:39 AM      Result Value Range Status   Specimen Description URINE, CATHETERIZED   Final   Special Requests NONE   Final   Culture  Setup Time 09/10/2012 17:34   Final   Colony Count >=100,000 COLONIES/ML   Final   Culture     Final   Value: METHICILLIN RESISTANT STAPHYLOCOCCUS AUREUS     Note: RIFAMPIN AND GENTAMICIN SHOULD NOT BE USED AS SINGLE DRUGS FOR TREATMENT OF STAPH INFECTIONS. CRITICAL RESULT CALLED TO, READ BACK BY AND VERIFIED WITH: TY H@1156  ON [id number] BY [facility]   Report Status 09/12/2012 FINAL   Final   Organism ID, Bacteria METHICILLIN RESISTANT STAPHYLOCOCCUS AUREUS   Final  MRSA PCR SCREENING     Status: None   Collection Time    09/10/12  1:49 PM      Result Value Range Status   MRSA by PCR NEGATIVE  NEGATIVE Final   Comment:            The GeneXpert MRSA Assay (FDA     approved for NASAL specimens     only), is one component of a     comprehensive MRSA colonization     surveillance program. It is not     intended to diagnose MRSA     infection nor to guide or     monitor treatment for     MRSA  infections.  CULTURE, BLOOD (ROUTINE X 2)     Status: None   Collection Time    09/13/12  9:45 AM      Result Value Range Status   Specimen Description BLOOD LEFT ARM   Final   Special Requests BOTTLES DRAWN AEROBIC AND ANAEROBIC 10CC   Final   Culture  Setup Time 09/13/2012 15:32   Final   Culture     Final   Value:        BLOOD CULTURE RECEIVED NO GROWTH TO DATE CULTURE WILL BE HELD FOR 5 DAYS BEFORE ISSUING A FINAL NEGATIVE REPORT   Report Status PENDING   Incomplete  CULTURE, BLOOD (ROUTINE X 2)     Status: None   Collection Time    09/13/12  9:55 AM      Result Value Range Status   Specimen Description BLOOD LEFT HAND   Final   Special Requests BOTTLES DRAWN AEROBIC ONLY 10CC   Final   Culture  Setup Time 09/13/2012 15:32   Final   Culture     Final   Value:        BLOOD CULTURE RECEIVED NO  GROWTH TO DATE CULTURE WILL BE HELD FOR 5 DAYS BEFORE ISSUING A FINAL NEGATIVE REPORT   Report Status PENDING   Incomplete    Anti-infectives   Start     Dose/Rate Route Frequency Ordered Stop   09/13/12 1000  vancomycin (VANCOCIN) IVPB 1000 mg/200 mL premix     1,000 mg 200 mL/hr over 60 Minutes Intravenous Every 12 hours 09/13/12 0829     09/11/12 1400  piperacillin-tazobactam (ZOSYN) IVPB 3.375 g  Status:  Discontinued     3.375 g 12.5 mL/hr over 240 Minutes Intravenous 3 times per day 09/11/12 1111 09/12/12 0831   09/11/12 1400  vancomycin (VANCOCIN) IVPB 1000 mg/200 mL premix  Status:  Discontinued     1,000 mg 200 mL/hr over 60 Minutes Intravenous Every 24 hours 09/11/12 1123 09/13/12 0829   09/10/12 1400  vancomycin (VANCOCIN) 1,500 mg in sodium chloride 0.9 % 500 mL IVPB     1,500 mg 250 mL/hr over 120 Minutes Intravenous  Once 09/10/12 1223 09/10/12 1534   09/10/12 1300  piperacillin-tazobactam (ZOSYN) IVPB 2.25 g  Status:  Discontinued     2.25 g 100 mL/hr over 30 Minutes Intravenous 3 times per day 09/10/12 1223 09/11/12 1111      Assessment: 79 y.o. M who continues on  Vancomycin for confirmed MRSA UTI this admission. The patient is noted to also have a prior positive MRSA urine culture as an outpatient and was treated previously with Bactrim. Given that MRSA is not a natural colonizer/pathogen of the urine and that the patient has a hx of a prior ICD -- it is likely that this patient will need to undergo additional work-up to r/o bacteremia vs infective endocarditis (discussed with Dr. Lavera Guise)  The patient was noted to have acute renal failure on admit which is now resolving. SCr was 3.38 on admit and has now trended down to 0.88, estimated CrCl~60-65 ml/min.  Trough back tonight at 20.6 mcg/ml.  This level is a s/s level and reflects adequate clearance.  I suspect that he may also still have a little "hang on" from his recent renal failure.  Goal of Therapy:  Vancomycin trough level 15-20 mcg/ml  Plan:  1. Continue Vancomycin at 1g IV every 12 hours 2. Will continue to follow renal function, culture results, LOT, and antibiotic de-escalation plans   Nadara Mustard, PharmD., MS Clinical Pharmacist Pager:  [phone] Thank you for allowing pharmacy to be part of this patients care team. 09/14/2012 10:40 PM

## 2012-09-14 NOTE — Consult Note (Signed)
WOC follow up Wound type: Stage IV pressure ulcer Pressure Ulcer POA: Yes Measurement: 6cm x 5cm x 5 cm (per WOC note from Sunday 09/11/12) Wound bed: 15% yellow slough, adherent to the coccyx bone, otherwise 85% beefy red early granulation tissue with open wound edges. Drainage (amount, consistency, odor) moderate serosanguinous drainage Periwound: intact Dressing procedure/placement/frequency:Pt currently having daily hydrotherapy, asked to evaluate progress and d/c of tx.  Agree that ok to dc hydrotherapy and team discussed with pt and his wife transition to the [facility].  Pt has had NPWT in the past, unclear of the reason that it was not successful.  Wife has requested I contact staff at SNF for report on the previous use of the VAC.  I have LVM with Dorinda Hill AD at [facility].   Will plan with the patient's permission to start VAC dressing changes tom. Supplies ordered.  Troy Cook Ben Arnold RN,CWOCN [phone]

## 2012-09-14 NOTE — Progress Notes (Signed)
Patient ZO:XWRUEA MACALISTER Cook      DOB: 06-Nov-1931      VWU:[id number]   Palliative Medicine Team at [facility] Progress Note    Subjective: patient reports pain is tolerable.  Abdomen feels better since able to have bowel movement.  States he is passing gas.  His spouse is at the bedside, and asks that when we speak to Troy Cook that we use a low soft voice as loud sounds make him agitated.  He denies nausea and is cooperative with physical therapy and wound care exam     Filed Vitals:   09/14/12 0453  BP: 108/55  Pulse: 99  Temp: 98.1 F (36.7 C)  Resp: 18   Physical exam: General: no acute distress PERRL, EOMI, anicteric, mmm Chest decreased, no rhonchi, (pt reports intermittent cough) CVS: regular, S1, S2 Abd: obsese soft, diffusely tender, no guarding or rebound not distended Ext : pale , warm no mottling Neuro: awake , alert, sober, with dry sense of humor  Lab Results  Component Value Date   WBC 14.4* 09/13/2012   HGB 9.0* 09/13/2012   HCT 27.9* 09/13/2012   MCV 86.6 09/13/2012   PLT 322 09/13/2012   Lab Results  Component Value Date   CREATININE 0.88 09/13/2012   BUN 37* 09/13/2012   NA 141 09/13/2012   K 4.5 09/13/2012   CL 109 09/13/2012   CO2 24 09/13/2012      Assessment and plan: This is a 79 year old white male with a known past medical history for coronary artery disease, CHF, diabetes and recurrent MRSA UTIs. Patient was admitted with abdominal pain, constipation, and acute on chronic weakness which has resulted in the prior to admission decubitus ulcer. Patient was found to have severe sepsis and is being treated with vancomycin and Zosyn as well as local wound care. His course is complicated by what appeared to be a small bowel obstruction, and obstipation. At this time, his bowels have been relieved.  I was present during wound care consult which is suggesting and wound VAC may be appropriate. There is concern that the patient may have a MRSA bacteremia that is continually  seeding his urine. Infectious disease is monitoring in evaluating him for this. Decisions will need to be made for further testing including TEE, and long-term antibiotics. The patient is very guarded in his participation in his plan of care and so further conversation should be had by the consulting physicians regarding these 2 measures. At this time the patient's plan of care includes a full scope of medical treatment to reverse reversible causes. Further goals of care should be initiated as the patient's wound heals or declines, and so recommendations should be for palliative care services to follow at the time of discharge.  1. DO NOT RESUSCITATE DO NOT INTUBATE 2. Continue local wound care per wound ostomy team. Currently receiving hydrotherapy. 3 depression patient refuses treatment continue to support as holistically as possible 4. Obstipation resolved continue to monitor with aggressive bowel regimen 5. Poor appetite secondary likely to multifactorial issues continue to monitor and encourage the patient to eat Case discussed with infectious disease physician   Total time. 25 minutes  Audwin Semper L. Ladona Ridgel, MD MBA The Palliative Medicine Team at [facility] Phone: [phone] Pager: [phone]

## 2012-09-14 NOTE — Progress Notes (Signed)
[facility]    Subjective: No new complaints   Antibiotics:  Anti-infectives   Start     Dose/Rate Route Frequency Ordered Stop   09/13/12 1000  vancomycin (VANCOCIN) IVPB 1000 mg/200 mL premix     1,000 mg 200 mL/hr over 60 Minutes Intravenous Every 12 hours 09/13/12 0829     09/11/12 1400  piperacillin-tazobactam (ZOSYN) IVPB 3.375 g  Status:  Discontinued     3.375 g 12.5 mL/hr over 240 Minutes Intravenous 3 times per day 09/11/12 1111 09/12/12 0831   09/11/12 1400  vancomycin (VANCOCIN) IVPB 1000 mg/200 mL premix  Status:  Discontinued     1,000 mg 200 mL/hr over 60 Minutes Intravenous Every 24 hours 09/11/12 1123 09/13/12 0829   09/10/12 1400  vancomycin (VANCOCIN) 1,500 mg in sodium chloride 0.9 % 500 mL IVPB     1,500 mg 250 mL/hr over 120 Minutes Intravenous  Once 09/10/12 1223 09/10/12 1534   09/10/12 1300  piperacillin-tazobactam (ZOSYN) IVPB 2.25 g  Status:  Discontinued     2.25 g 100 mL/hr over 30 Minutes Intravenous 3 times per day 09/10/12 1223 09/11/12 1111      Medications: Scheduled Meds: . antiseptic oral rinse  15 mL Mouth Rinse q12n4p  . [START ON 09/15/2012] carvedilol  6.25 mg Oral BID WC  . enoxaparin (LOVENOX) injection  100 mg Subcutaneous Q12H  . feeding supplement  30 mL Oral BID  . feeding supplement  1 Container Oral TID WC  . insulin aspart  0-9 Units Subcutaneous Q6H  . metoCLOPramide (REGLAN) injection  10 mg Intravenous Q6H  . multivitamin with minerals  1 tablet Oral Daily  . nutrition supplement  1 packet Oral BID BM  . polyethylene glycol  17 g Oral Daily  . vancomycin  1,000 mg Intravenous Q12H  . Warfarin - Pharmacist Dosing Inpatient   Does not apply q1800   Continuous Infusions: . sodium chloride 100 mL/hr (09/14/12 1825)   PRN Meds:.LORazepam, morphine injection, sodium phosphate   Objective: Weight change: 0 lb (0 kg)  Intake/Output Summary (Last 24 hours) at 09/14/12 2042 Last data filed at  09/14/12 1709  Gross per 24 hour  Intake    480 ml  Output   2350 ml  Net  -1870 ml   Blood pressure 82/39, pulse 109, temperature 98.1 F (36.7 C), temperature source Oral, resp. rate 22, height 5\' 11"  (1.803 m), weight 226 lb 10.1 oz (102.799 kg), SpO2 98.00%. Temp:  [97.3 F (36.3 C)-98.4 F (36.9 C)] 98.1 F (36.7 C) (04/02 2017) Pulse Rate:  [98-109] 109 (04/02 2017) Resp:  [6-28] 22 (04/02 2017) BP: (76-143)/(32-97) 82/39 mmHg (04/02 2017) SpO2:  [10 %-100 %] 98 % (04/02 2017) Weight:  [226 lb 10.1 oz (102.799 kg)] 226 lb 10.1 oz (102.799 kg) (04/01 2106)  Physical Exam: HEENT: anicteric sclera, pupils reactive to light and accommodation, EOMI, oropharynx clear and without exudate  CVS irr, irr, normal r, no murmur rubs or gallops  Chest: clear to auscultation bilaterally, no wheezing, rales or rhonchi  Abdomen: soft nontender, nondistended, normal bowel sounds,  Extremities: no clubbing or edema noted bilaterally  Skin: Decubitus is packed  Neuro: nonfocal,   Lab Results:  Recent Labs  09/13/12 0455  WBC 14.4*  HGB 9.0*  HCT 27.9*  PLT 322    BMET  Recent Labs  09/13/12 0455  NA 141  K 4.5  CL 109  CO2 24  GLUCOSE 130*  BUN 37*  CREATININE 0.88  CALCIUM 8.3*    Micro Results: Recent Results (from the past 240 hour(s))  URINE CULTURE     Status: None   Collection Time    09/10/12 11:39 AM      Result Value Range Status   Specimen Description URINE, CATHETERIZED   Final   Special Requests NONE   Final   Culture  Setup Time 09/10/2012 17:34   Final   Colony Count >=100,000 COLONIES/ML   Final   Culture     Final   Value: METHICILLIN RESISTANT STAPHYLOCOCCUS AUREUS     Note: RIFAMPIN AND GENTAMICIN SHOULD NOT BE USED AS SINGLE DRUGS FOR TREATMENT OF STAPH INFECTIONS. CRITICAL RESULT CALLED TO, READ BACK BY AND VERIFIED WITH: TY H@1156  ON [id number] BY [facility]   Report Status 09/12/2012 FINAL   Final   Organism ID, Bacteria METHICILLIN RESISTANT  STAPHYLOCOCCUS AUREUS   Final  MRSA PCR SCREENING     Status: None   Collection Time    09/10/12  1:49 PM      Result Value Range Status   MRSA by PCR NEGATIVE  NEGATIVE Final   Comment:            The GeneXpert MRSA Assay (FDA     approved for NASAL specimens     only), is one component of a     comprehensive MRSA colonization     surveillance program. It is not     intended to diagnose MRSA     infection nor to guide or     monitor treatment for     MRSA infections.  CULTURE, BLOOD (ROUTINE X 2)     Status: None   Collection Time    09/13/12  9:45 AM      Result Value Range Status   Specimen Description BLOOD LEFT ARM   Final   Special Requests BOTTLES DRAWN AEROBIC AND ANAEROBIC 10CC   Final   Culture  Setup Time 09/13/2012 15:32   Final   Culture     Final   Value:        BLOOD CULTURE RECEIVED NO GROWTH TO DATE CULTURE WILL BE HELD FOR 5 DAYS BEFORE ISSUING A FINAL NEGATIVE REPORT   Report Status PENDING   Incomplete  CULTURE, BLOOD (ROUTINE X 2)     Status: None   Collection Time    09/13/12  9:55 AM      Result Value Range Status   Specimen Description BLOOD LEFT HAND   Final   Special Requests BOTTLES DRAWN AEROBIC ONLY 10CC   Final   Culture  Setup Time 09/13/2012 15:32   Final   Culture     Final   Value:        BLOOD CULTURE RECEIVED NO GROWTH TO DATE CULTURE WILL BE HELD FOR 5 DAYS BEFORE ISSUING A FINAL NEGATIVE REPORT   Report Status PENDING   Incomplete    Studies/Results: No results found.    Assessment/Plan: Troy Cook is a 79 y.o. male with  MRSA having  recovered on multiple occasions from his urine raising the specter of an occult undiagnosed methicillin-resistant staph aureus bacteremia. He was admitted with sepsis and multi-organ disease, including confusion and renal insufficiency. He certainly has a large decubitus ulcer which could be the source of his bacteremia. He also has an ICD which we were worried might be checked it. Fortunately his  transesophageal echocardiogram does not show any evidence of vegetations on his heart valves or  his pacer wires.  #1 Likely occult MRSA bacteremia: TEE is clean --Followup blood cultures today  --I need to examine his wound closely (I was in clinic this morning when the dressing was changed) --despite negative TEE I would recommend at least 4 if not 6 weeks of IV vancomycin  #2 Stage IV decubitus ulcer: I need to examine this but given depth osteo is high likelihood,  --will consider imaging  --will check ESR and CRP  --wound care       LOS: 4 days   Acey Lav 09/14/2012, 8:42 PM

## 2012-09-14 NOTE — Progress Notes (Signed)
Physical Therapy Wound Care Progress Note 09/14/12 1000  Subjective Assessment  Subjective Would like to have assessments and plan explained to me.  Pressure Ulcer 09/10/12 Stage IV - Full thickness tissue loss with exposed bone, tendon or muscle. 3.5"x2"x1.5"   Date First Assessed/Time First Assessed: 09/10/12 1410   Location: Sacrum  Staging: Stage IV - Full thickness tissue loss with exposed bone, tendon or muscle.  Wound Description (Comments): 3.5"x2"x1.5"   Present on Admission: Yes  State of Healing Early/partial granulation  Site / Wound Assessment Granulation tissue;Yellow;Bleeding  % Wound base Red or Granulating 80%  % Wound base Yellow 15%  % Wound base Other (Comment) 5% (white fibrous tissue over sacrum)  Peri-wound Assessment Intact;Erythema (blanchable);Other (Comment) (tape blisters at 11:00)  Wound Length (cm) 6.7 cm (measured opening only (not nonviable tissue at 6:00))  Wound Width (cm) 6.8 cm  Wound Depth (cm) 2.8 cm (measured with pt left sidelying 1/8 turn towards prone)  Undermining (cm) 2.2 (at 12:00)  Margins Unattacted edges (unapproximated)  Drainage Amount Minimal  Drainage Description Serosanguineous;No odor  Treatment Debridement (Selective);Hydrotherapy (Pulse lavage);Packing (Saline gauze);Tape changed  Dressing Type ABD;Moist to dry;Barrier Film (skin prep);Tape dressing (kerlix packing)  Dressing Changed  Hydrotherapy  Pulsed Lavage with Suction (psi) 4 psi (4-8 psi)  Pulsed Lavage with Suction - Normal Saline Used 1000 mL  Pulsed Lavage Tip Tip with splash shield  Pulsed lavage therapy - wound location sacrum  Selective Debridement  Selective Debridement - Location sacrum  Selective Debridement - Tools Used Forceps;Scissors  Selective Debridement - Tissue Removed yellow slough  Wound Therapy - Assess/Plan/Recommendations  Wound Therapy - Clinical Statement Patient now with 2 BM's during treatment today.  RN does report giving medications for  bowels.  WOC RN in to assess and agrees pt may be ready for wound VAC with plans to place tomorrow after treatment; however, if frequent stools continue wonder if just needs frequent dressing changes.  Wound Therapy - Functional Problem List limited positioning options due to decr skin integrity  Factors Delaying/Impairing Wound Healing Infection - systemic/local;Immobility;Multiple medical problems  Hydrotherapy Plan Debridement;Dressing change;Patient/family education;Pulsatile lavage with suction  Wound Therapy - Frequency 6X / week  Wound Therapy - Current Recommendations Nutritionist  Wound Therapy - Follow Up Recommendations Skilled nursing facility  Wound Therapy Goals - Improve the function of patient's integumentary system by progressing the wound(s) through the phases of wound healing by:  Decrease Necrotic Tissue to 10  Decrease Necrotic Tissue - Progress Progressing toward goal  Increase Granulation Tissue to 90  Increase Granulation Tissue - Progress Progressing toward goal  Decrease Length/Width/Depth by (cm) .2  Decrease Length/Width/Depth - Progress Progressing toward goal  Patient/Family will be able to  verbalize proper positioning for pressure reduction to assist with healing  Patient/Family Instruction Goal - Progress Progressing toward goal  Goals/treatment plan/discharge plan were made with and agreed upon by patient/family Yes  Wound Therapy - Potential for Goals Elby Showers, [place] [phone] 09/14/2012

## 2012-09-14 NOTE — Progress Notes (Addendum)
Text page to Donnamarie Poag for follow-up b/p = 89/32. With call back. Pt remains asymptomatic. Will continue to monitor. Dondra Spry

## 2012-09-14 NOTE — Progress Notes (Addendum)
[facility] PROGRESS NOTE  Troy Cook UJW:[id number] DOB: 03-19-1932 DOA: 09/10/2012 PCP: Daisy Floro, MD  Assessment/Plan: Severe Sepsis - multifactorial - MRSA UTI, - patient most likely has staph aureus bacteremia which will explain why he had persistent mrsa utis at SNF priro to admission to [place]. Started empiric iv vancomycin and Zosyn on admission . Zosyn stopped 3/31 after culture results back. Continue Vancomycin iv . ID consultation:   -would pursue TEE  --given ICD presence and likely occult bacteremia, would very likely want to treat for 4 weeks with IV vancomycin EVEN if the ICD does not appear infected,  --If the ICD has vegetations it would need to be extracted and he would need 4-6 weeks of abx prior to remiplantation if cure was the goal  SBO - probable distal obstruction - ? From constipation vs adhesions from prior surgery. Had NG tube, suctioning, bowel rest from 3/29-3/30 with minimal improvement and minimal fluid suctioned . NG tube removed 09/11/12. patient is too ill for surgical intervention. Constipation resolved by 3/31 and SBO resolved .   AKI - multifactorial - suspect from direct infectious injury to the tubules after persistent staph aureus infection , combined with dehydration - iv fluids and closely monitor status - improved by 09/11/12. Iv fluids stopped 09/12/12. Good UOP  Stage IV decub ulcer - air mattress overlay. wound care consult obtained on 3/30 and patient started hydrotherapy 3/31.   Cardiomyopathy s/p AICD - last EF 35%. Cards consultation called   Hyperkalemia secondary to renal failure - resolved with D50 and insulin and hydration   Hyponatremia from dehydration - resolved   DM 2 - SSI   Afib with RVR - decompensated from sepsis - started low dose coreg. treat pain and sepsis. lovenox- bridging to coumadin- after TEE  Patient has multiple systems and organ failure, he has been deconditioned, bed bound prior to this  admission.Palliative care consult called 3/30 No plans for pressors, ICU transfer, surgery,  Leucocytosis: improved  Code Status: DNR Family Communication: wife at bedside Disposition Plan: SNF?   Consultants:  EP  cards  Procedures:  TEE 4/2  Antibiotics:  vanc  HPI/Subjective: Sleepy today -given IV pain and ativan prior to wound care this AM at 4  Objective: Filed Vitals:   09/13/12 1400 09/13/12 1727 09/13/12 2106 09/14/12 0453  BP: 112/67 114/72 135/78 108/55  Pulse: 112 107 100 99  Temp: 98 F (36.7 C) 98.6 F (37 C) 97.3 F (36.3 C) 98.1 F (36.7 C)  TempSrc: Oral Oral Oral Oral  Resp: 18 18 18 18   Height:   5\' 11"  (1.803 m)   Weight:   102.799 kg (226 lb 10.1 oz)   SpO2: 96% 98% 99% 99%    Intake/Output Summary (Last 24 hours) at 09/14/12 0906 Last data filed at 09/14/12 0454  Gross per 24 hour  Intake    480 ml  Output   2850 ml  Net  -2370 ml   Filed Weights   09/11/12 2218 09/12/12 2031 09/13/12 2106  Weight: 102.8 kg (226 lb 10.1 oz) 102.799 kg (226 lb 10.1 oz) 102.799 kg (226 lb 10.1 oz)    Exam:   General:  sleepy  Cardiovascular: irr  Respiratory:clear, no wheezing  Abdomen: +BS, soft, NT  Musculoskeletal: moves all 4 ext   Data Reviewed: Basic Metabolic Panel:  Recent Labs Lab 09/10/12 1004 09/11/12 0625 09/13/12 0455  NA 130* 137 141  K 6.0* 4.4 4.5  CL 95* 105 109  CO2  18* 20 24  GLUCOSE 219* 138* 130*  BUN 104* 82* 37*  CREATININE 3.38* 1.88* 0.88  CALCIUM 9.2 8.5 8.3*   Liver Function Tests:  Recent Labs Lab 09/10/12 1004  AST 8  ALT 9  ALKPHOS 92  BILITOT 0.3  PROT 7.7  ALBUMIN 2.1*    Recent Labs Lab 09/10/12 1004  LIPASE 15   No results found for this basename: AMMONIA,  in the last 168 hours CBC:  Recent Labs Lab 09/10/12 1004 09/11/12 0625 09/13/12 0455  WBC 45.4* 26.2* 14.4*  NEUTROABS 43.6*  --   --   HGB 11.6* 9.4* 9.0*  HCT 33.4* 27.4* 27.9*  MCV 81.3 82.8 86.6  PLT 427*  337 322   Cardiac Enzymes: No results found for this basename: CKTOTAL, CKMB, CKMBINDEX, TROPONINI,  in the last 168 hours BNP (last 3 results)  Recent Labs  10/26/11 1440  PROBNP 2287.0*   CBG:  Recent Labs Lab 09/13/12 0609 09/13/12 1252 09/13/12 1714 09/13/12 2358 09/14/12 0622  GLUCAP 145* 184* 219* 199* 158*    Recent Results (from the past 240 hour(s))  URINE CULTURE     Status: None   Collection Time    09/10/12 11:39 AM      Result Value Range Status   Specimen Description URINE, CATHETERIZED   Final   Special Requests NONE   Final   Culture  Setup Time 09/10/2012 17:34   Final   Colony Count >=100,000 COLONIES/ML   Final   Culture     Final   Value: METHICILLIN RESISTANT STAPHYLOCOCCUS AUREUS     Note: RIFAMPIN AND GENTAMICIN SHOULD NOT BE USED AS SINGLE DRUGS FOR TREATMENT OF STAPH INFECTIONS. CRITICAL RESULT CALLED TO, READ BACK BY AND VERIFIED WITH: TY H@[id number]  ON [id number] BY [facility]   Report Status 09/12/2012 FINAL   Final   Organism ID, Bacteria METHICILLIN RESISTANT STAPHYLOCOCCUS AUREUS   Final  MRSA PCR SCREENING     Status: None   Collection Time    09/10/12  1:49 PM      Result Value Range Status   MRSA by PCR NEGATIVE  NEGATIVE Final   Comment:            The GeneXpert MRSA Assay (FDA     approved for NASAL specimens     only), is one component of a     comprehensive MRSA colonization     surveillance program. It is not     intended to diagnose MRSA     infection nor to guide or     monitor treatment for     MRSA infections.  CULTURE, BLOOD (ROUTINE X 2)     Status: None   Collection Time    09/13/12  9:45 AM      Result Value Range Status   Specimen Description BLOOD LEFT ARM   Final   Special Requests BOTTLES DRAWN AEROBIC AND ANAEROBIC 10CC   Final   Culture  Setup Time 09/13/2012 15:32   Final   Culture     Final   Value:        BLOOD CULTURE RECEIVED NO GROWTH TO DATE CULTURE WILL BE HELD FOR 5 DAYS BEFORE ISSUING A FINAL NEGATIVE REPORT    Report Status PENDING   Incomplete  CULTURE, BLOOD (ROUTINE X 2)     Status: None   Collection Time    09/13/12  9:55 AM      Result Value Range Status   Specimen Description BLOOD  LEFT HAND   Final   Special Requests BOTTLES DRAWN AEROBIC ONLY 10CC   Final   Culture  Setup Time 09/13/2012 15:32   Final   Culture     Final   Value:        BLOOD CULTURE RECEIVED NO GROWTH TO DATE CULTURE WILL BE HELD FOR 5 DAYS BEFORE ISSUING A FINAL NEGATIVE REPORT   Report Status PENDING   Incomplete     Studies: No results found.  Scheduled Meds: . antiseptic oral rinse  15 mL Mouth Rinse q12n4p  . carvedilol  3.125 mg Oral BID WC  . feeding supplement  30 mL Oral BID  . feeding supplement  1 Container Oral TID WC  . insulin aspart  0-9 Units Subcutaneous Q6H  . metoCLOPramide (REGLAN) injection  10 mg Intravenous Q6H  . multivitamin with minerals  1 tablet Oral Daily  . nutrition supplement  1 packet Oral BID BM  . polyethylene glycol  17 g Oral Daily  . vancomycin  1,000 mg Intravenous Q12H  . Warfarin - Pharmacist Dosing Inpatient   Does not apply q1800   Continuous Infusions: . sodium chloride 10 mL/hr (09/14/12 1610)    Principal Problem:   Sepsis Active Problems:   Normocytic anemia   Dyslipidemia   Atrial fibrillation   ICD (implantable cardiac defibrillator) in place   Chronic systolic heart failure   Pressure ulcer stage IV   UTI (urinary tract infection)   Hyperkalemia   SBO (small bowel obstruction)   AKI (acute kidney injury)   MRSA infection   MRSA (methicillin resistant Staphylococcus aureus) septicemia   Bacteremia   ICD (implantable cardiac defibrillator) infection    Time spent: 35    [facility], Lilee Aldea  [facility] Pager [phone]. If 7PM-7AM, please contact night-coverage at [email or web address], password [facility] 09/14/2012, 9:06 AM  LOS: 4 days

## 2012-09-14 NOTE — Progress Notes (Addendum)
Admit date: 09/10/2012 Referring Physician  Dr. Johney Frame  Primary Cardiologist  Rayson Rando-new Reason for Consultation  AFib, ?endocarditis  HPI: 79 year old man with systolic dysfunction and defibrillator placement last year.  He was diagnosed with a staph bacteremia.  There is concern that his defibrillator leads may be infected.  I was called to evaluate for TEE.  It's also noted that his heart rate is poorly controlled.  His rate control drugs have been decreased due to hypotension and infection.  He is not reporting palpitations.  Overall, he feels very fatigued.  He is also quite anxious about his condition.     PMH:   Past Medical History  Diagnosis Date  . Atrial fibrillation   . Diabetes mellitus   . Hypertension   . Coronary artery disease   . Hyperlipemia   . ICD (implantable cardiac defibrillator) in place   . CHF (congestive heart failure)      PSH:   Past Surgical History  Procedure Laterality Date  . Cholecystectomy    . Cardiac defibrillator placement  11/19/2011    single chamber  . Cardiac catheterization      Allergies:  Review of patient's allergies indicates no known allergies. Prior to Admit Meds:   Prescriptions prior to admission  Medication Sig Dispense Refill  . aspirin 81 MG chewable tablet Chew 81 mg by mouth daily.      . bisacodyl (DULCOLAX) 10 MG suppository Place 10 mg rectally daily as needed for constipation.      . carvedilol (COREG) 12.5 MG tablet Take 1 tablet (12.5 mg total) by mouth 2 (two) times daily with a meal.  60 tablet  6  . celecoxib (CELEBREX) 200 MG capsule Take 200 mg by mouth daily.      Marland Kitchen doxycycline (VIBRA-TABS) 100 MG tablet Take 1 tablet (100 mg total) by mouth 2 (two) times daily.  24 tablet  0  . DULoxetine (CYMBALTA) 30 MG capsule Take 1 capsule (30 mg total) by mouth daily.  30 capsule  3  . ferrous sulfate 325 (65 FE) MG tablet Take 325 mg by mouth daily with breakfast.      . furosemide (LASIX) 20 MG tablet Take 20 mg by  mouth daily.      Marland Kitchen glipiZIDE (GLUCOTROL) 10 MG tablet Take 10 mg by mouth 2 (two) times daily before a meal.      . isosorbide mononitrate (IMDUR) 30 MG 24 hr tablet Take 30 mg by mouth daily.      Marland Kitchen lisinopril (PRINIVIL,ZESTRIL) 20 MG tablet Take 20 mg by mouth 2 (two) times daily.      . magnesium hydroxide (MILK OF MAGNESIA) 400 MG/5ML suspension Take 30 mLs by mouth daily as needed for constipation.      . metFORMIN (GLUCOPHAGE) 500 MG tablet Take 500 mg by mouth 2 (two) times daily with a meal.      . methocarbamol (ROBAXIN) 500 MG tablet Take 1 tablet (500 mg total) by mouth 3 (three) times daily.  30 tablet  0  . Nutritional Supplements (RESOURCE PO) Take 90 mLs by mouth 2 (two) times daily.      Marland Kitchen OVER THE COUNTER MEDICATION Take by mouth 2 (two) times daily. Protein supplement powder, 2 scoops in milkshake      . polyethylene glycol (MIRALAX / GLYCOLAX) packet Take 17 g by mouth daily.      Marland Kitchen saccharomyces boulardii (FLORASTOR) 250 MG capsule Take 250 mg by mouth 2 (two) times daily. 3 week  course - 09/01/12 thru 09/21/12      . simvastatin (ZOCOR) 20 MG tablet Take 20 mg by mouth every evening.      . sodium phosphate (FLEET) 7-19 GM/118ML ENEM Place 1 enema rectally as needed (after removing hard stool or if soft stool found in rectum).      . tamsulosin (FLOMAX) 0.4 MG CAPS Take 0.4 mg by mouth daily.      . traMADol (ULTRAM) 50 MG tablet Take 50-100 mg by mouth every 6 (six) hours as needed for pain (1 tablet for moderate pain, 2 tablets for severe pain).      . Warfarin Sodium (COUMADIN PO) Take 8 mg by mouth daily.      . furosemide (LASIX) 20 MG tablet Take 60 mg by mouth daily.       Fam HX:   History reviewed. No pertinent family history. Social HX:    History   Social History  . Marital Status: Married    Spouse Name: N/A    Number of Children: N/A  . Years of Education: N/A   Occupational History  . Not on file.   Social History Main Topics  . Smoking status: Former  Smoker    Quit date: 02/13/2001  . Smokeless tobacco: Never Used  . Alcohol Use: No  . Drug Use: No  . Sexually Active: No   Other Topics Concern  . Not on file   Social History Narrative  . No narrative on file     ROS:  All 11 ROS were addressed and are negative except what is stated in the HPI  Physical Exam: Blood pressure 130/83, pulse 105, temperature 97.8 F (36.6 C), temperature source Oral, resp. rate 12, height 5\' 11"  (1.803 m), weight 102.799 kg (226 lb 10.1 oz), SpO2 99.00%.    General: Frail Head:   Normal cephalic and atramatic  Lungs:  No wheezing Heart:  Tachycardic, irregularly irregular Abdomen: Obese Msk:  Generally weak Extremities:  Trace pedal edema.   Neuro: Alert and oriented  Psych:  Flat affect, responds appropriately    Labs:   Lab Results  Component Value Date   WBC 14.4* 09/13/2012   HGB 9.0* 09/13/2012   HCT 27.9* 09/13/2012   MCV 86.6 09/13/2012   PLT 322 09/13/2012    Recent Labs Lab 09/10/12 1004  09/13/12 0455  NA 130*  < > 141  K 6.0*  < > 4.5  CL 95*  < > 109  CO2 18*  < > 24  BUN 104*  < > 37*  CREATININE 3.38*  < > 0.88  CALCIUM 9.2  < > 8.3*  PROT 7.7  --   --   BILITOT 0.3  --   --   ALKPHOS 92  --   --   ALT 9  --   --   AST 8  --   --   GLUCOSE 219*  < > 130*  < > = values in this interval not displayed. No results found for this basename: PTT   Lab Results  Component Value Date   INR 1.62* 09/14/2012   INR 2.75* 09/13/2012   INR 4.41* 09/11/2012   Lab Results  Component Value Date   CKTOTAL 37 10/27/2011   CKMB 2.5 10/27/2011   TROPONINI <0.30 10/27/2011     No results found for this basename: CHOL   No results found for this basename: HDL   No results found for this basename: LDLCALC   No  results found for this basename: TRIG   No results found for this basename: CHOLHDL   No results found for this basename: LDLDIRECT      Radiology:  No results found.  EKG:  Atrial fibrillation with rapid ventricular  response, right bundle branch block  ASSESSMENT: Left ventricular systolic dysfunction, atrial fibrillation, bacteremia  PLAN:  Will increase rate control medicines as blood pressure tolerates.  He is at high-risk of developing symptoms of congestive heart failure.  He will need regular diuretics as his blood pressure and renal function allow.  Discussed TEE with the patient.  Risks and benefits discussed and he is agreeable.  R/O vegetation on ICD leads.  Corky Crafts., MD  09/14/2012  2:59 PM

## 2012-09-14 NOTE — CV Procedure (Signed)
TEE performed without apparent complications.  No vegetation noted on mitral , aortic, tricuspid valves.  No vegetation on AICD lead.  Mild MR.  Trace AI.  Mild aortic plaque.  No LA/LAA thrombus.

## 2012-09-14 NOTE — Progress Notes (Signed)
ANTICOAGULATION CONSULT NOTE - follow up  Pharmacy Consult for warfarin Indication: atrial fibrillation  No Known Allergies  Patient Measurements: Height: 5\' 11"  (180.3 cm) Weight: 226 lb 10.1 oz (102.799 kg) IBW/kg (Calculated) : 75.3  Vital Signs: Temp: 98.4 F (36.9 C) (04/02 0959) Temp src: Oral (04/02 0959) BP: 112/72 mmHg (04/02 0959) Pulse Rate: 98 (04/02 0959)  Labs:  Recent Labs  09/13/12 0455 09/13/12 0945 09/14/12 0510  HGB 9.0*  --   --   HCT 27.9*  --   --   PLT 322  --   --   LABPROT  --  27.7* 18.7*  INR  --  2.75* 1.62*  CREATININE 0.88  --   --     Estimated Creatinine Clearance: 84.4 ml/min (by C-G formula based on Cr of 0.88).   Assessment: 80 yof on chronic coumadin for afib admitted with INR 5.66 due to drug interaction with bactrim.   After multiple days of held therapy, INR yesterday was therapeutic at 2.75. Today his INR is low at 1.62 after 8 mg given yesterday.  His home  coumadin dose was recently changed to 8mg  daily but it isn't completely clear what he was on prior to this.  No bleeding reported.  To have a TEE today to r/o vegetations in pt with possible MRSA bacteremia (no cultures to confirm).   Goal of Therapy:  INR 2-3   Plan:  1. repeat Warfarin 8mg  PO x 1 tonight 2. Daily INR 3. Consider LMWH bridge therapy until INR tx Herby Abraham, Pharm.D. [phone] 09/14/2012 10:40 AM   Len Childs T 09/14/2012,10:30 AM

## 2012-09-14 NOTE — Progress Notes (Signed)
  Echocardiogram Echocardiogram Transesophageal has been performed.  Troy Cook A 09/14/2012, 2:58 PM

## 2012-09-14 NOTE — Progress Notes (Signed)
Palliative Medicine Team Progress Note   S: "Maybe better". Had several productive BM's yesterday with much relief. Abdominal cramping much less.  Assessment: Troy Cook is better today, he is cautious to make a positive claim but he ate a respectable breakfast and seemed to be very happy about that since nutrition and wound healing was one of his concerns expressed-he also seems to be less frustrated and irritable and more inclined to being a partner in achieving his goal for healing and QOL.   Plan:   1. Severe constipation: Maintain an aggressive daily bowel regimen, will continue scheduled reglan for now and if no BM today may consider additional dose of Relistor, since he is able to keep down PO will use oral laxatives. I encouraged OOB.  2. Pain, related to wound and also from constipation/SBO: continue pre-meds before drg changes to include ativan and morphine.  3. Anxiety: lorazepam prior to drg changes and hydrotherapy.  Patient requesting specialist evaluation of his wound.   Time: 25 minutes. Greater than 50%  of this time was spent counseling and coordinating care related to the above assessment and plan.  Anderson Malta, DO Palliative Medicine

## 2012-09-15 ENCOUNTER — Encounter: Payer: Self-pay | Admitting: *Deleted

## 2012-09-15 ENCOUNTER — Encounter (HOSPITAL_COMMUNITY): Payer: Self-pay | Admitting: Interventional Cardiology

## 2012-09-15 ENCOUNTER — Inpatient Hospital Stay (HOSPITAL_COMMUNITY): Payer: PRIVATE HEALTH INSURANCE

## 2012-09-15 DIAGNOSIS — N390 Urinary tract infection, site not specified: Secondary | ICD-10-CM

## 2012-09-15 LAB — CBC
HCT: 24.6 % — ABNORMAL LOW (ref 39.0–52.0)
Hemoglobin: 8.1 g/dL — ABNORMAL LOW (ref 13.0–17.0)
MCHC: 32.9 g/dL (ref 30.0–36.0)
RDW: 16.1 % — ABNORMAL HIGH (ref 11.5–15.5)
WBC: 10.2 10*3/uL (ref 4.0–10.5)

## 2012-09-15 LAB — BASIC METABOLIC PANEL
BUN: 23 mg/dL (ref 6–23)
Chloride: 111 mEq/L (ref 96–112)
GFR calc Af Amer: >90 mL/min (ref >90)
GFR calc non Af Amer: >90 mL/min (ref >90)
Potassium: 4 mEq/L (ref 3.5–5.1)
Sodium: 141 mEq/L (ref 135–145)

## 2012-09-15 LAB — PROTIME-INR
INR: 1.69 — ABNORMAL HIGH (ref 0.00–1.49)
Prothrombin Time: 19.3 seconds — ABNORMAL HIGH (ref 11.6–15.2)

## 2012-09-15 LAB — GLUCOSE, CAPILLARY
Glucose-Capillary: 134 mg/dL — ABNORMAL HIGH (ref 70–99)
Glucose-Capillary: 132 mg/dL — ABNORMAL HIGH (ref 70–99)

## 2012-09-15 MED ORDER — WARFARIN SODIUM 4 MG PO TABS
8.0000 mg | ORAL_TABLET | Freq: Once | ORAL | Status: AC
  Administered 2012-09-15: 8 mg via ORAL
  Filled 2012-09-15: qty 2

## 2012-09-15 MED ORDER — SODIUM CHLORIDE 0.9 % IJ SOLN
10.0000 mL | INTRAMUSCULAR | Status: DC | PRN
  Administered 2012-09-16: 10 mL

## 2012-09-15 NOTE — Progress Notes (Signed)
ANTICOAGULATION CONSULT NOTE - follow up  Pharmacy Consult for warfarin Indication: atrial fibrillation  No Known Allergies  Patient Measurements: Height: 5\' 11"  (180.3 cm) Weight: 224 lb 10.4 oz (101.9 kg) IBW/kg (Calculated) : 75.3  Vital Signs: Temp: 97.8 F (36.6 C) (04/03 0900) Temp src: Oral (04/03 0900) BP: 98/44 mmHg (04/03 0900) Pulse Rate: 106 (04/03 0900)  Labs:  Recent Labs  09/13/12 0455 09/13/12 0945 09/14/12 0510 09/15/12 0547  HGB 9.0*  --   --  8.1*  HCT 27.9*  --   --  24.6*  PLT 322  --   --  272  LABPROT  --  27.7* 18.7* 19.3*  INR  --  2.75* 1.62* 1.69*  CREATININE 0.88  --   --  0.66    Estimated Creatinine Clearance: 92.5 ml/min (by C-G formula based on Cr of 0.66).   Assessment: 80 yom on chronic coumadin for afib admitted with INR 5.66 due to drug interaction with bactrim.   After multiple days of held therapy, INR 4/1 was therapeutic at 2.75. Today his INR is low at 1.69 after 2 doses of  8 mg coumadin.  His home  coumadin dose was recently changed to 8mg  daily but it isn't completely clear what he was on prior to this.  No bleeding reported.  LMWH bridge therapy started 4/2 PM.   Goal of Therapy:  INR 2-3   Plan:  1. repeat Warfarin 8mg  PO x 1 tonight 2. Continue LMWH 1 mg/kg = 100 mg q12h until INR > = 2 3. Daily INR  Herby Abraham, Pharm.D. [phone] 09/15/2012 11:12 AM

## 2012-09-15 NOTE — Progress Notes (Signed)
Physical Therapy Wound Treatment and Discharge Patient Details  Name: Troy Cook MRN: [id number] Date of Birth: 1932/04/27  Today's Date: 09/15/2012 Time: 4098-1191 Time Calculation (min): 30 min  Subjective     Pain Score: Pain Score:   4  Wound Assessment  Pressure Ulcer 09/10/12 Stage IV - Full thickness tissue loss with exposed bone, tendon or muscle. 3.5"x2"x1.5"  (Active)  State of Healing Early/partial granulation 09/15/2012  9:31 AM  Site / Wound Assessment Bleeding;Granulation tissue;Pink;Red;Yellow 09/15/2012  9:31 AM  % Wound base Red or Granulating 80% 09/15/2012  9:31 AM  % Wound base Yellow 15% 09/15/2012  9:31 AM  % Wound base Black 5% 09/14/2012  3:57 AM  % Wound base Other (Comment) 5% 09/15/2012  9:31 AM  Peri-wound Assessment Intact;Erythema (blanchable) 09/15/2012  9:31 AM  Wound Length (cm) 6.7 cm 09/14/2012 10:00 AM  Wound Width (cm) 6.8 cm 09/14/2012 10:00 AM  Wound Depth (cm) 2.8 cm 09/14/2012 10:00 AM  Tunneling (cm) .5 09/11/2012  3:00 PM  Undermining (cm) 2.2 09/14/2012 10:00 AM  Margins Unattacted edges (unapproximated) 09/15/2012  9:31 AM  Drainage Amount Minimal 09/15/2012  9:31 AM  Drainage Description Serosanguineous 09/15/2012  9:31 AM  Treatment Debridement (Selective);Hydrotherapy (Pulse lavage);Negative pressure wound therapy 09/15/2012  9:31 AM  Dressing Type Moist to moist;Moisture barrier;Gauze (Comment);ABD 09/15/2012  1:30 AM  Dressing Other (Comment) 09/15/2012  9:31 AM     Wound 10/28/11 Skin tear  red & serousanquenous drainage noted-aleo vesta cream applied (Active)     Incision 11/19/11 Chest Left;Upper (Active)   Hydrotherapy Pulsed lavage therapy - wound location: sacrum Pulsed Lavage with Suction (psi):  (4-8 psi) Pulsed Lavage with Suction - Normal Saline Used: 1000 mL Pulsed Lavage Tip: Tip with splash shield Selective Debridement Selective Debridement - Location: sacrum Selective Debridement - Tools Used: Forceps;Scissors Selective Debridement - Tissue  Removed: yellow slough   Wound Assessment and Plan  Wound Therapy - Assess/Plan/Recommendations Wound Therapy - Clinical Statement: Very little slough present in wound today for debridement. Majority of yellow tissue very adherent and fibrous over sacrum. Melody, WOC RN in to apply VAC at end of session. No further hydrotherapy indicated at this time. Discussed with pt and wife. Wound Therapy - Functional Problem List: limited positioning options due to decr skin integrity Factors Delaying/Impairing Wound Healing: Infection - systemic/local;Immobility;Multiple medical problems Hydrotherapy Plan: Other (comment) (discontinue) Wound Therapy - Follow Up Recommendations: Skilled nursing facility  Wound Therapy Goals- Improve the function of patient's integumentary system by progressing the wound(s) through the phases of wound healing (inflammation - proliferation - remodeling) by: Decrease Necrotic Tissue to: 10 Decrease Necrotic Tissue - Progress: Progressing toward goal Increase Granulation Tissue to: 90 Increase Granulation Tissue - Progress: Progressing toward goal Decrease Length/Width/Depth by (cm): .2 Decrease Length/Width/Depth - Progress: Met Patient/Family will be able to : verbalize proper positioning for pressure reduction to assist with healing Patient/Family Instruction Goal - Progress: Progressing toward goal Goals/treatment plan/discharge plan were made with and agreed upon by patient/family: Yes  Goals will be updated until maximal potential achieved or discharge criteria met.  Discharge criteria: when goals achieved, discharge from hospital, MD decision/surgical intervention, no progress towards goals, refusal/missing three consecutive treatments without notification or medical reason.  GP     Leylanie Woodmansee 09/15/2012, 12:24 PM

## 2012-09-15 NOTE — Consult Note (Signed)
WOC follow up  Wound type: Stage IV pressure ulcer Pressure Ulcer POA: Yes Measurement:see PT notes Wound bed:90% beefy red tissue, no real granulation tissue present currently.  10% yellow slough in the base over the bone. Drainage (amount, consistency, odor) moderate to heavy serosanguinous  Periwound:intact Dressing procedure/placement/frequency: distal edge of wound lined with hydrocolloid to aid in seal of NPWT dressing, 1pc of black granufoam placed in the wound, seal at Pt tolerated well. Skin at tubing exit site protected with hydrocolloid as well. Will monitor for need to bridge at next visit. Bedside nursing staff to change VAC over the weekend.   WOC will follow along with you for wound management Jermani Pund [facility] RN,CWOCN [phone]

## 2012-09-15 NOTE — Progress Notes (Addendum)
SUBJECTIVE:  feels better today.  He ate a full breakfast.  No throat pain after TEE.    OBJECTIVE:   Vitals:   Filed Vitals:   09/14/12 2017 09/14/12 2300 09/15/12 0612 09/15/12 0900  BP: 82/39 92/42 100/45 98/44  Pulse: 109  103 106  Temp: 98.1 F (36.7 C)  97.5 F (36.4 C) 97.8 F (36.6 C)  TempSrc: Oral  Oral Oral  Resp: 22  20 22   Height:      Weight: 101.9 kg (224 lb 10.4 oz)     SpO2: 98%  100% 100%   I&O's:   Intake/Output Summary (Last 24 hours) at 09/15/12 1331 Last data filed at 09/15/12 1000  Gross per 24 hour  Intake    640 ml  Output   1951 ml  Net  -1311 ml        PHYSICAL EXAM General:  frail  Head:   Normal cephalic and atramatic  Lungs:  scant wheezing bilaterally  Heart:   tachycardic, irregular  Abdomen:  abdomen soft and non-tender Msk:   generally weak  Extremities:   trace edema.  DP +1 Neuro: Alert and oriented Psych:   normal affect, responds appropriately   LABS: Basic Metabolic Panel:  Recent Labs  16/10/96 0455 09/15/12 0547  NA 141 141  K 4.5 4.0  CL 109 111  CO2 24 26  GLUCOSE 130* 128*  BUN 37* 23  CREATININE 0.88 0.66  CALCIUM 8.3* 7.8*   Liver Function Tests: No results found for this basename: AST, ALT, ALKPHOS, BILITOT, PROT, ALBUMIN,  in the last 72 hours No results found for this basename: LIPASE, AMYLASE,  in the last 72 hours CBC:  Recent Labs  09/13/12 0455 09/15/12 0547  WBC 14.4* 10.2  HGB 9.0* 8.1*  HCT 27.9* 24.6*  MCV 86.6 86.0  PLT 322 272   Cardiac Enzymes: No results found for this basename: CKTOTAL, CKMB, CKMBINDEX, TROPONINI,  in the last 72 hours BNP: No components found with this basename: POCBNP,  D-Dimer: No results found for this basename: DDIMER,  in the last 72 hours Hemoglobin A1C: No results found for this basename: HGBA1C,  in the last 72 hours Fasting Lipid Panel: No results found for this basename: CHOL, HDL, LDLCALC, TRIG, CHOLHDL, LDLDIRECT,  in the last 72 hours Thyroid  Function Tests: No results found for this basename: TSH, T4TOTAL, FREET3, T3FREE, THYROIDAB,  in the last 72 hours Anemia Panel: No results found for this basename: VITAMINB12, FOLATE, FERRITIN, TIBC, IRON, RETICCTPCT,  in the last 72 hours Coag Panel:   Lab Results  Component Value Date   INR 1.69* 09/15/2012   INR 1.62* 09/14/2012   INR 2.75* 09/13/2012    RADIOLOGY: Dg Abd Acute W/chest  09/10/2012  *RADIOLOGY REPORT*  Clinical Data: Abdominal pain.  Constipation.  UTI.  ACUTE ABDOMEN SERIES (ABDOMEN 2 VIEW & CHEST 1 VIEW)  Comparison: 11/20/2011  Findings: AICD unchanged in position.  Mildly low lung volumes noted.  Atherosclerotic calcification of the aortic arch is present.  There is degenerative glenohumeral arthropathy bilaterally.  No free intraperitoneal gas is observed on the left side down lateral decubitus view.  There are air-fluid levels scattered in its of dilated small bowel measuring up to 3.7 cm in diameter. Some of these air-fluid levels are probably at differing vertical heights in the same loop of small bowel.  There is a paucity of bowel gas in the pelvis, although more rectal stool is visible.  Prominent lumbar spondylosis  observed.  Dextroconvex lumbar scoliosis.  IMPRESSION:  1.  Abnormal air-fluid levels in dilated small bowel.  Appearance is suspicious for small bowel obstruction. 2.  Paucity of bowel gas in the pelvis may be due to more proximal obstruction or less likely a mass in the pelvis.  However, there is formed stool in the sigmoid colon and rectum. 3.  No free intraperitoneal gas. 4.  Atherosclerosis. 5.  Lumbar spondylosis.   Original Report Authenticated By: Gaylyn Rong, M.D.       ASSESSMENT: Recurrent staph UTI, atrial fibrillation, chronic systolic heart failure    PLAN:  antibiotics for infection.  Spoke with Dr. Ladona Ridgel yesterday.  There is still a chance that the patient may need his device explanted.  No obvious vegetations on TEE.  Increase  carvedilol yesterday.  His rate is still over 100.  I would like for him to be on telemetry so that we can monitor his rate control.  Given his low ejection fraction, he is at high risk of heart failure symptoms developing from a high heart rate.  Coumadin for stroke prevention.  This has been subtherapeutic at times.  Coumadin dose will have to be adjusted in the setting of antibiotics.    Consider digoxin for rate control given his blood pressure is borderline at times.  Corky Crafts., MD  09/15/2012  1:31 PM

## 2012-09-15 NOTE — Progress Notes (Signed)
[facility]    Subjective: No new complaints   Antibiotics:  Anti-infectives   Start     Dose/Rate Route Frequency Ordered Stop   09/13/12 1000  vancomycin (VANCOCIN) IVPB 1000 mg/200 mL premix     1,000 mg 200 mL/hr over 60 Minutes Intravenous Every 12 hours 09/13/12 0829     09/11/12 1400  piperacillin-tazobactam (ZOSYN) IVPB 3.375 g  Status:  Discontinued     3.375 g 12.5 mL/hr over 240 Minutes Intravenous 3 times per day 09/11/12 1111 09/12/12 0831   09/11/12 1400  vancomycin (VANCOCIN) IVPB 1000 mg/200 mL premix  Status:  Discontinued     1,000 mg 200 mL/hr over 60 Minutes Intravenous Every 24 hours 09/11/12 1123 09/13/12 0829   09/10/12 1400  vancomycin (VANCOCIN) 1,500 mg in sodium chloride 0.9 % 500 mL IVPB     1,500 mg 250 mL/hr over 120 Minutes Intravenous  Once 09/10/12 1223 09/10/12 1534   09/10/12 1300  piperacillin-tazobactam (ZOSYN) IVPB 2.25 g  Status:  Discontinued     2.25 g 100 mL/hr over 30 Minutes Intravenous 3 times per day 09/10/12 1223 09/11/12 1111      Medications: Scheduled Meds: . antiseptic oral rinse  15 mL Mouth Rinse q12n4p  . carvedilol  6.25 mg Oral BID WC  . enoxaparin (LOVENOX) injection  100 mg Subcutaneous Q12H  . feeding supplement  30 mL Oral BID  . feeding supplement  1 Container Oral TID WC  . insulin aspart  0-9 Units Subcutaneous Q6H  . metoCLOPramide (REGLAN) injection  10 mg Intravenous Q6H  . multivitamin with minerals  1 tablet Oral Daily  . nutrition supplement  1 packet Oral BID BM  . polyethylene glycol  17 g Oral Daily  . vancomycin  1,000 mg Intravenous Q12H  . warfarin  8 mg Oral ONCE-1800  . Warfarin - Pharmacist Dosing Inpatient   Does not apply q1800   Continuous Infusions:   PRN Meds:.LORazepam, morphine injection, sodium phosphate   Objective: Weight change: -1 lb 15.7 oz (-0.899 kg)  Intake/Output Summary (Last 24 hours) at 09/15/12 1144 Last data filed at 09/15/12 1000  Gross per 24 hour  Intake    240 ml  Output   1951 ml  Net  -1711 ml   Blood pressure 98/44, pulse 106, temperature 97.8 F (36.6 C), temperature source Oral, resp. rate 22, height 5\' 11"  (1.803 m), weight 224 lb 10.4 oz (101.9 kg), SpO2 100.00%. Temp:  [97.5 F (36.4 C)-98.1 F (36.7 C)] 97.8 F (36.6 C) (04/03 0900) Pulse Rate:  [103-109] 106 (04/03 0900) Resp:  [6-28] 22 (04/03 0900) BP: (76-143)/(32-97) 98/44 mmHg (04/03 0900) SpO2:  [10 %-100 %] 100 % (04/03 0900) Weight:  [224 lb 10.4 oz (101.9 kg)] 224 lb 10.4 oz (101.9 kg) (04/02 2017)  Physical Exam: HEENT: anicteric sclera, pupils reactive to light and accommodation, EOMI, oropharynx clear and without exudate  CVS irr, irr, normal r, no murmur rubs or gallops  Chest: clear to auscultation bilaterally, no wheezing, rales or rhonchi  Abdomen: soft nontender, nondistended, normal bowel sounds,   Skin: VERY LARGE DECUBITUS ULCER THAT GOES TO BONE,. NO PURULENCE IN WOUND BUT IT IS VERY DEEP Neuro: nonfocal,   Lab Results:  Recent Labs  09/13/12 0455 09/15/12 0547  WBC 14.4* 10.2  HGB 9.0* 8.1*  HCT 27.9* 24.6*  PLT 322 272    BMET  Recent Labs  09/13/12 0455 09/15/12 0547  NA 141 141  K 4.5 4.0  CL 109 111  CO2 24 26  GLUCOSE 130* 128*  BUN 37* 23  CREATININE 0.88 0.66  CALCIUM 8.3* 7.8*    Micro Results: Recent Results (from the past 240 hour(s))  URINE CULTURE     Status: None   Collection Time    09/10/12 11:39 AM      Result Value Range Status   Specimen Description URINE, CATHETERIZED   Final   Special Requests NONE   Final   Culture  Setup Time 09/10/2012 17:34   Final   Colony Count >=100,000 COLONIES/ML   Final   Culture     Final   Value: METHICILLIN RESISTANT STAPHYLOCOCCUS AUREUS     Note: RIFAMPIN AND GENTAMICIN SHOULD NOT BE USED AS SINGLE DRUGS FOR TREATMENT OF STAPH INFECTIONS. CRITICAL RESULT CALLED TO, READ BACK BY AND VERIFIED WITH: TY H@1156  ON [id number] BY [facility]   Report Status  09/12/2012 FINAL   Final   Organism ID, Bacteria METHICILLIN RESISTANT STAPHYLOCOCCUS AUREUS   Final  MRSA PCR SCREENING     Status: None   Collection Time    09/10/12  1:49 PM      Result Value Range Status   MRSA by PCR NEGATIVE  NEGATIVE Final   Comment:            The GeneXpert MRSA Assay (FDA     approved for NASAL specimens     only), is one component of a     comprehensive MRSA colonization     surveillance program. It is not     intended to diagnose MRSA     infection nor to guide or     monitor treatment for     MRSA infections.  CULTURE, BLOOD (ROUTINE X 2)     Status: None   Collection Time    09/13/12  9:45 AM      Result Value Range Status   Specimen Description BLOOD LEFT ARM   Final   Special Requests BOTTLES DRAWN AEROBIC AND ANAEROBIC 10CC   Final   Culture  Setup Time 09/13/2012 15:32   Final   Culture     Final   Value:        BLOOD CULTURE RECEIVED NO GROWTH TO DATE CULTURE WILL BE HELD FOR 5 DAYS BEFORE ISSUING A FINAL NEGATIVE REPORT   Report Status PENDING   Incomplete  CULTURE, BLOOD (ROUTINE X 2)     Status: None   Collection Time    09/13/12  9:55 AM      Result Value Range Status   Specimen Description BLOOD LEFT HAND   Final   Special Requests BOTTLES DRAWN AEROBIC ONLY 10CC   Final   Culture  Setup Time 09/13/2012 15:32   Final   Culture     Final   Value:        BLOOD CULTURE RECEIVED NO GROWTH TO DATE CULTURE WILL BE HELD FOR 5 DAYS BEFORE ISSUING A FINAL NEGATIVE REPORT   Report Status PENDING   Incomplete    Studies/Results: No results found.    Assessment/Plan: PETR BONTEMPO is a 79 y.o. male with  MRSA having  recovered on multiple occasions from his urine raising the specter of an occult undiagnosed methicillin-resistant staph aureus bacteremia. He was admitted with sepsis and multi-organ disease, including confusion and renal insufficiency. He certainly has a large decubitus ulcer which could be the source of his bacteremia. He also  has an ICD which we  were worried might be checked it. Fortunately his transesophageal echocardiogram does not show any evidence of vegetations on his heart valves or his pacer wires.  #1 Likely occult MRSA bacteremia: TEE is clean. He is discussed extensively with Dr. Eldridge Dace and Dr. Ladona Ridgel and we all agree that reasonable approach at this point is:  --6 weeks of IV vancomycin to be dosed thru May 13 --He should have weekly CBC BMP and vancomycin troughs faxed to Dr. Algis Liming at [phone]. --then stop abx and repeat surveillance cultures 2 weeks after last dose of abx   #2 Stage IV decubitus ulcer:given depth =  osteo   --will give 6 week course of IV abx --depending on goals of care one could consider plastic surgery referral but I will defer this to primary team  I will arrange PICC line and ask his management to arrange IV antibiotics will see the patient one week post hospital discharge and again 2 weeks after completing his IV abx  I will sign off for now please call with further questions.  I spent greater than 45 minutes with the patient including greater than 50% of time in face to face counsel of the patient and in coordination of their care.       LOS: 5 days   Acey Lav 09/15/2012, 11:44 AM

## 2012-09-15 NOTE — Progress Notes (Signed)
[facility] PROGRESS NOTE  Troy Cook ZOX:[id number] DOB: July 24, 1931 DOA: 09/10/2012 PCP: Daisy Floro, MD  Assessment/Plan: Severe Sepsis - multifactorial - MRSA UTI, - patient most likely has staph aureus bacteremia which will explain why he had persistent mrsa utis at SNF priro to admission to [place]. Started empiric iv vancomycin and Zosyn on admission . Zosyn stopped 3/31 after culture results back. Continue Vancomycin iv . ID consultation:   -TEE negative --given ICD presence and likely occult bacteremia,  treat for 6 weeks with IV vancomycin  -PICC Line He should have weekly CBC BMP and vancomycin troughs faxed to Dr. Algis Liming at [phone].  --then stop abx and repeat surveillance cultures 2 weeks after last dose of abx   SBO - probable distal obstruction - ? From constipation vs adhesions from prior surgery. Had NG tube, suctioning, bowel rest from 3/29-3/30 with minimal improvement and minimal fluid suctioned . NG tube removed 09/11/12. patient is too ill for surgical intervention. Constipation resolved by 3/31 and SBO resolved .   AKI - multifactorial - suspect from direct infectious injury to the tubules after persistent staph aureus infection , combined with dehydration - iv fluids and closely monitor status - improved by 09/11/12. Iv fluids stopped 09/12/12. Good UOP  Stage IV decub ulcer - air mattress overlay. wound care consult obtained on 3/30 and patient started hydrotherapy 3/31, wound vac placed 4/3, may need outpatient referral to plastics/wound care center  Cardiomyopathy s/p AICD - last EF 35%. Medical management   Hyperkalemia secondary to renal failure - resolved with D50 and insulin and hydration   Hyponatremia from dehydration - resolved   DM 2 - SSI   Afib with RVR - decompensated from sepsis - started low dose coreg. treat pain and sepsis. lovenox- bridging to coumadin-   Patient has multiple systems and organ failure, he has been deconditioned, bed  bound prior to this admission.Palliative care consult called 3/30 No plans for pressors, ICU transfer, surgery,  Leucocytosis: improved  Code Status: DNR Family Communication: wife at bedside Disposition Plan: SNF hopefully [facility]   Consultants:  EP  cards  Procedures:  TEE 4/2  Antibiotics:  vanc  HPI/Subjective: no new c/o today   Objective: Filed Vitals:   09/14/12 2017 09/14/12 2300 09/15/12 0612 09/15/12 0900  BP: 82/39 92/42 100/45 98/44  Pulse: 109  103 106  Temp: 98.1 F (36.7 C)  97.5 F (36.4 C) 97.8 F (36.6 C)  TempSrc: Oral  Oral Oral  Resp: 22  20 22   Height:      Weight: 101.9 kg (224 lb 10.4 oz)     SpO2: 98%  100% 100%    Intake/Output Summary (Last 24 hours) at 09/15/12 1239 Last data filed at 09/15/12 1000  Gross per 24 hour  Intake    240 ml  Output   1951 ml  Net  -1711 ml   Filed Weights   09/12/12 2031 09/13/12 2106 09/14/12 2017  Weight: 102.799 kg (226 lb 10.1 oz) 102.799 kg (226 lb 10.1 oz) 101.9 kg (224 lb 10.4 oz)    Exam:   General:  sleepy  Cardiovascular: irr  Respiratory:clear, no wheezing  Abdomen: +BS, soft, NT  Musculoskeletal: moves all 4 ext - very weak  Data Reviewed: Basic Metabolic Panel:  Recent Labs Lab 09/10/12 1004 09/11/12 0625 09/13/12 0455 09/15/12 0547  NA 130* 137 141 141  K 6.0* 4.4 4.5 4.0  CL 95* 105 109 111  CO2 18* 20 24 26   GLUCOSE  219* 138* 130* 128*  BUN 104* 82* 37* 23  CREATININE 3.38* 1.88* 0.88 0.66  CALCIUM 9.2 8.5 8.3* 7.8*   Liver Function Tests:  Recent Labs Lab 09/10/12 1004  AST 8  ALT 9  ALKPHOS 92  BILITOT 0.3  PROT 7.7  ALBUMIN 2.1*    Recent Labs Lab 09/10/12 1004  LIPASE 15   No results found for this basename: AMMONIA,  in the last 168 hours CBC:  Recent Labs Lab 09/10/12 1004 09/11/12 0625 09/13/12 0455 09/15/12 0547  WBC 45.4* 26.2* 14.4* 10.2  NEUTROABS 43.6*  --   --   --   HGB 11.6* 9.4* 9.0* 8.1*  HCT 33.4* 27.4* 27.9* 24.6*   MCV 81.3 82.8 86.6 86.0  PLT 427* 337 322 272   Cardiac Enzymes: No results found for this basename: CKTOTAL, CKMB, CKMBINDEX, TROPONINI,  in the last 168 hours BNP (last 3 results)  Recent Labs  10/26/11 1440  PROBNP 2287.0*   CBG:  Recent Labs Lab 09/14/12 1231 09/14/12 1757 09/14/12 2334 09/15/12 0600 09/15/12 1134  GLUCAP 165* 172* 134* 132* 149*    Recent Results (from the past 240 hour(s))  URINE CULTURE     Status: None   Collection Time    09/10/12 11:39 AM      Result Value Range Status   Specimen Description URINE, CATHETERIZED   Final   Special Requests NONE   Final   Culture  Setup Time 09/10/2012 17:34   Final   Colony Count >=100,000 COLONIES/ML   Final   Culture     Final   Value: METHICILLIN RESISTANT STAPHYLOCOCCUS AUREUS     Note: RIFAMPIN AND GENTAMICIN SHOULD NOT BE USED AS SINGLE DRUGS FOR TREATMENT OF STAPH INFECTIONS. CRITICAL RESULT CALLED TO, READ BACK BY AND VERIFIED WITH: TY H@1156  ON [id number] BY [facility]   Report Status 09/12/2012 FINAL   Final   Organism ID, Bacteria METHICILLIN RESISTANT STAPHYLOCOCCUS AUREUS   Final  MRSA PCR SCREENING     Status: None   Collection Time    09/10/12  1:49 PM      Result Value Range Status   MRSA by PCR NEGATIVE  NEGATIVE Final   Comment:            The GeneXpert MRSA Assay (FDA     approved for NASAL specimens     only), is one component of a     comprehensive MRSA colonization     surveillance program. It is not     intended to diagnose MRSA     infection nor to guide or     monitor treatment for     MRSA infections.  CULTURE, BLOOD (ROUTINE X 2)     Status: None   Collection Time    09/13/12  9:45 AM      Result Value Range Status   Specimen Description BLOOD LEFT ARM   Final   Special Requests BOTTLES DRAWN AEROBIC AND ANAEROBIC 10CC   Final   Culture  Setup Time 09/13/2012 15:32   Final   Culture     Final   Value:        BLOOD CULTURE RECEIVED NO GROWTH TO DATE CULTURE WILL BE HELD FOR 5  DAYS BEFORE ISSUING A FINAL NEGATIVE REPORT   Report Status PENDING   Incomplete  CULTURE, BLOOD (ROUTINE X 2)     Status: None   Collection Time    09/13/12  9:55 AM  Result Value Range Status   Specimen Description BLOOD LEFT HAND   Final   Special Requests BOTTLES DRAWN AEROBIC ONLY 10CC   Final   Culture  Setup Time 09/13/2012 15:32   Final   Culture     Final   Value:        BLOOD CULTURE RECEIVED NO GROWTH TO DATE CULTURE WILL BE HELD FOR 5 DAYS BEFORE ISSUING A FINAL NEGATIVE REPORT   Report Status PENDING   Incomplete     Studies: No results found.  Scheduled Meds: . antiseptic oral rinse  15 mL Mouth Rinse q12n4p  . carvedilol  6.25 mg Oral BID WC  . enoxaparin (LOVENOX) injection  100 mg Subcutaneous Q12H  . feeding supplement  30 mL Oral BID  . feeding supplement  1 Container Oral TID WC  . insulin aspart  0-9 Units Subcutaneous Q6H  . metoCLOPramide (REGLAN) injection  10 mg Intravenous Q6H  . multivitamin with minerals  1 tablet Oral Daily  . nutrition supplement  1 packet Oral BID BM  . polyethylene glycol  17 g Oral Daily  . vancomycin  1,000 mg Intravenous Q12H  . warfarin  8 mg Oral ONCE-1800  . Warfarin - Pharmacist Dosing Inpatient   Does not apply q1800   Continuous Infusions:    Principal Problem:   Sepsis Active Problems:   Normocytic anemia   Dyslipidemia   Atrial fibrillation   ICD (implantable cardiac defibrillator) in place   Chronic systolic heart failure   Pressure ulcer stage IV   UTI (urinary tract infection)   Hyperkalemia   SBO (small bowel obstruction)   AKI (acute kidney injury)   MRSA infection   MRSA (methicillin resistant Staphylococcus aureus) septicemia   Bacteremia   ICD (implantable cardiac defibrillator) infection   Diabetes    Time spent: 35    [facility], Nathaneal Sommers  [facility] Pager [phone]. If 7PM-7AM, please contact night-coverage at [email or web address], password [facility] 09/15/2012, 12:39 PM  LOS: 5 days

## 2012-09-15 NOTE — Progress Notes (Signed)
Peripherally Inserted Central Catheter/Midline Placement  The IV Nurse has discussed with the patient and/or persons authorized to consent for the patient, the purpose of this procedure and the potential benefits and risks involved with this procedure.  The benefits include less needle sticks, lab draws from the catheter and patient may be discharged home with the catheter.  Risks include, but not limited to, infection, bleeding, blood clot (thrombus formation), and puncture of an artery; nerve damage and irregular heat beat.  Alternatives to this procedure were also discussed.  PICC/Midline Placement Documentation  PICC / Midline Single Lumen 09/15/12 PICC Right Basilic (Active)       Stacie Glaze Horton 09/15/2012, 3:15 PM

## 2012-09-16 ENCOUNTER — Inpatient Hospital Stay (HOSPITAL_COMMUNITY): Payer: PRIVATE HEALTH INSURANCE

## 2012-09-16 DIAGNOSIS — A4102 Sepsis due to Methicillin resistant Staphylococcus aureus: Principal | ICD-10-CM

## 2012-09-16 DIAGNOSIS — A419 Sepsis, unspecified organism: Secondary | ICD-10-CM

## 2012-09-16 DIAGNOSIS — M869 Osteomyelitis, unspecified: Secondary | ICD-10-CM

## 2012-09-16 DIAGNOSIS — E119 Type 2 diabetes mellitus without complications: Secondary | ICD-10-CM

## 2012-09-16 LAB — PROTIME-INR
INR: 1.65 — ABNORMAL HIGH (ref 0.00–1.49)
Prothrombin Time: 19 seconds — ABNORMAL HIGH (ref 11.6–15.2)

## 2012-09-16 LAB — CLOSTRIDIUM DIFFICILE BY PCR: Toxigenic C. Difficile by PCR: NEGATIVE

## 2012-09-16 LAB — GLUCOSE, CAPILLARY
Glucose-Capillary: 156 mg/dL — ABNORMAL HIGH (ref 70–99)
Glucose-Capillary: 161 mg/dL — ABNORMAL HIGH (ref 70–99)
Glucose-Capillary: 188 mg/dL — ABNORMAL HIGH (ref 70–99)

## 2012-09-16 LAB — ABO/RH: ABO/RH(D): AB POS

## 2012-09-16 LAB — PREPARE RBC (CROSSMATCH)

## 2012-09-16 MED ORDER — DIGOXIN 125 MCG PO TABS
0.1250 mg | ORAL_TABLET | Freq: Every day | ORAL | Status: DC
  Filled 2012-09-16: qty 1

## 2012-09-16 MED ORDER — AMIODARONE LOAD VIA INFUSION
150.0000 mg | Freq: Once | INTRAVENOUS | Status: AC
  Administered 2012-09-16: 150 mg via INTRAVENOUS
  Filled 2012-09-16: qty 83.34

## 2012-09-16 MED ORDER — FUROSEMIDE 10 MG/ML IJ SOLN
20.0000 mg | Freq: Once | INTRAMUSCULAR | Status: AC
  Administered 2012-09-17: 20 mg via INTRAVENOUS
  Filled 2012-09-16: qty 2

## 2012-09-16 MED ORDER — FUROSEMIDE 20 MG PO TABS
20.0000 mg | ORAL_TABLET | Freq: Once | ORAL | Status: AC
  Administered 2012-09-16: 20 mg via ORAL
  Filled 2012-09-16 (×2): qty 1

## 2012-09-16 MED ORDER — WARFARIN SODIUM 10 MG PO TABS
10.0000 mg | ORAL_TABLET | Freq: Once | ORAL | Status: AC
  Administered 2012-09-16: 10 mg via ORAL
  Filled 2012-09-16: qty 1

## 2012-09-16 MED ORDER — DIGOXIN 0.25 MG/ML IJ SOLN
0.2500 mg | Freq: Once | INTRAMUSCULAR | Status: DC
  Filled 2012-09-16: qty 1

## 2012-09-16 MED ORDER — AMIODARONE HCL IN DEXTROSE 360-4.14 MG/200ML-% IV SOLN
60.0000 mg/h | INTRAVENOUS | Status: AC
  Administered 2012-09-17 (×2): 60 mg/h via INTRAVENOUS
  Filled 2012-09-16 (×3): qty 200

## 2012-09-16 MED ORDER — DIGOXIN 0.25 MG/ML IJ SOLN
0.2500 mg | Freq: Once | INTRAMUSCULAR | Status: AC
  Administered 2012-09-16: 0.25 mg via INTRAVENOUS
  Filled 2012-09-16: qty 1

## 2012-09-16 MED ORDER — POLYETHYLENE GLYCOL 3350 17 G PO PACK
17.0000 g | PACK | Freq: Every day | ORAL | Status: DC | PRN
  Filled 2012-09-16: qty 1

## 2012-09-16 MED ORDER — AMIODARONE HCL IN DEXTROSE 360-4.14 MG/200ML-% IV SOLN
30.0000 mg/h | INTRAVENOUS | Status: DC
  Administered 2012-09-18: 30 mg/h via INTRAVENOUS
  Filled 2012-09-16 (×6): qty 200

## 2012-09-16 NOTE — Progress Notes (Signed)
SUBJECTIVE:   no acute issues overnight.    OBJECTIVE:   Vitals:   Filed Vitals:   09/15/12 1645 09/15/12 2119 09/16/12 0442 09/16/12 1037  BP: 110/56 73/58 84/41  108/57  Pulse: 110 103 115 105  Temp: 97.2 F (36.2 C) 97.8 F (36.6 C) 97.5 F (36.4 C) 98.4 F (36.9 C)  TempSrc: Oral Oral Oral Oral  Resp: 22 22 20 20   Height:      Weight:  109.498 kg (241 lb 6.4 oz)    SpO2: 99% 100% 100%    I&O's:    Intake/Output Summary (Last 24 hours) at 09/16/12 1303 Last data filed at 09/15/12 2030  Gross per 24 hour  Intake      0 ml  Output     25 ml  Net    -25 ml        PHYSICAL EXAM General:  frail  Head:   Normal cephalic and atramatic  Lungs:  scant wheezing bilaterally  Heart:   tachycardic, irregular  Abdomen:  abdomen soft and non-tender Msk:   generally weak  Extremities:   trace edema.  DP +1 Neuro: Alert and oriented Psych:   normal affect, responds appropriately   LABS: Basic Metabolic Panel:  Recent Labs  16/10/96 0547  NA 141  K 4.0  CL 111  CO2 26  GLUCOSE 128*  BUN 23  CREATININE 0.66  CALCIUM 7.8*   Liver Function Tests: No results found for this basename: AST, ALT, ALKPHOS, BILITOT, PROT, ALBUMIN,  in the last 72 hours No results found for this basename: LIPASE, AMYLASE,  in the last 72 hours CBC:  Recent Labs  09/15/12 0547  WBC 10.2  HGB 8.1*  HCT 24.6*  MCV 86.0  PLT 272   Cardiac Enzymes: No results found for this basename: CKTOTAL, CKMB, CKMBINDEX, TROPONINI,  in the last 72 hours BNP: No components found with this basename: POCBNP,  D-Dimer: No results found for this basename: DDIMER,  in the last 72 hours Hemoglobin A1C: No results found for this basename: HGBA1C,  in the last 72 hours Fasting Lipid Panel: No results found for this basename: CHOL, HDL, LDLCALC, TRIG, CHOLHDL, LDLDIRECT,  in the last 72 hours Thyroid Function Tests: No results found for this basename: TSH, T4TOTAL, FREET3, T3FREE, THYROIDAB,  in the  last 72 hours Anemia Panel: No results found for this basename: VITAMINB12, FOLATE, FERRITIN, TIBC, IRON, RETICCTPCT,  in the last 72 hours Coag Panel:   Lab Results  Component Value Date   INR 1.65* 09/16/2012   INR 1.69* 09/15/2012   INR 1.62* 09/14/2012    RADIOLOGY: Dg Abd Acute W/chest  09/10/2012  *RADIOLOGY REPORT*  Clinical Data: Abdominal pain.  Constipation.  UTI.  ACUTE ABDOMEN SERIES (ABDOMEN 2 VIEW & CHEST 1 VIEW)  Comparison: 11/20/2011  Findings: AICD unchanged in position.  Mildly low lung volumes noted.  Atherosclerotic calcification of the aortic arch is present.  There is degenerative glenohumeral arthropathy bilaterally.  No free intraperitoneal gas is observed on the left side down lateral decubitus view.  There are air-fluid levels scattered in its of dilated small bowel measuring up to 3.7 cm in diameter. Some of these air-fluid levels are probably at differing vertical heights in the same loop of small bowel.  There is a paucity of bowel gas in the pelvis, although more rectal stool is visible.  Prominent lumbar spondylosis observed.  Dextroconvex lumbar scoliosis.  IMPRESSION:  1.  Abnormal air-fluid levels in dilated small bowel.  Appearance is suspicious for small bowel obstruction. 2.  Paucity of bowel gas in the pelvis may be due to more proximal obstruction or less likely a mass in the pelvis.  However, there is formed stool in the sigmoid colon and rectum. 3.  No free intraperitoneal gas. 4.  Atherosclerosis. 5.  Lumbar spondylosis.   Original Report Authenticated By: Gaylyn Rong, M.D.       ASSESSMENT: Recurrent staph UTI, atrial fibrillation, chronic systolic heart failure    PLAN:  antibiotics for infection.  Spoke with Dr. Ladona Ridgel yesterday.  There is still a chance that the patient may need his device explanted, if he grows MRSA in the blood.  Currently, blood cultures have been negative.  Blood cultures were drawn after antibiotics were started.  No obvious  vegetations on TEE.  for now, would not plan on explanting device.    Increased carvedilol yesterday.  His rate is still over 100.   continue  telemetry so that we can monitor his rate control.  Given his low ejection fraction, he is at high risk of heart failure symptoms developing from a high heart rate.  Coumadin for stroke prevention.  This has been subtherapeutic at times.  Coumadin dose will have to be adjusted in the setting of antibiotics.      tachycardia is likely exacerbated by his anemia.  Consider digoxin for rate control given his blood pressure is borderline at times.  Corky Crafts., MD  09/16/2012  1:03 PM

## 2012-09-16 NOTE — Progress Notes (Addendum)
Patient had another run of non-sustained VTach HR up to 260.  M. Burnadette Peter, NP notified.  Will continue to monitor.  Steele Berg RN

## 2012-09-16 NOTE — Progress Notes (Signed)
MEDICATION RELATED CONSULT NOTE - INITIAL   Pharmacy Consult for Digoxin Indication: A-Fib with RVR  No Known Allergies  Patient Measurements: Height: 5\' 11"  (180.3 cm) Weight: 241 lb 6.4 oz (109.498 kg) IBW/kg (Calculated) : 75.3   Vital Signs: Temp: 99.8 F (37.7 C) (04/04 1636) Temp src: Oral (04/04 1037) BP: 150/86 mmHg (04/04 1636) Pulse Rate: 151 (04/04 1636) Intake/Output from previous day: 04/03 0701 - 04/04 0700 In: 400 [P.O.:400] Out: 26 [Drains:25; Stool:1] Intake/Output from this shift:    Labs:  Recent Labs  09/15/12 0547  WBC 10.2  HGB 8.1*  HCT 24.6*  PLT 272  CREATININE 0.66   Estimated Creatinine Clearance: 95.8 ml/min (by C-G formula based on Cr of 0.66).   Microbiology: Recent Results (from the past 720 hour(s))  URINE CULTURE     Status: None   Collection Time    09/10/12 11:39 AM      Result Value Range Status   Specimen Description URINE, CATHETERIZED   Final   Special Requests NONE   Final   Culture  Setup Time 09/10/2012 17:34   Final   Colony Count >=100,000 COLONIES/ML   Final   Culture     Final   Value: METHICILLIN RESISTANT STAPHYLOCOCCUS AUREUS     Note: RIFAMPIN AND GENTAMICIN SHOULD NOT BE USED AS SINGLE DRUGS FOR TREATMENT OF STAPH INFECTIONS. CRITICAL RESULT CALLED TO, READ BACK BY AND VERIFIED WITH: TY H@1156  ON [id number] BY [facility]   Report Status 09/12/2012 FINAL   Final   Organism ID, Bacteria METHICILLIN RESISTANT STAPHYLOCOCCUS AUREUS   Final  MRSA PCR SCREENING     Status: None   Collection Time    09/10/12  1:49 PM      Result Value Range Status   MRSA by PCR NEGATIVE  NEGATIVE Final   Comment:            The GeneXpert MRSA Assay (FDA     approved for NASAL specimens     only), is one component of a     comprehensive MRSA colonization     surveillance program. It is not     intended to diagnose MRSA     infection nor to guide or     monitor treatment for     MRSA infections.  CULTURE, BLOOD (ROUTINE X 2)      Status: None   Collection Time    09/13/12  9:45 AM      Result Value Range Status   Specimen Description BLOOD LEFT ARM   Final   Special Requests BOTTLES DRAWN AEROBIC AND ANAEROBIC 10CC   Final   Culture  Setup Time 09/13/2012 15:32   Final   Culture     Final   Value:        BLOOD CULTURE RECEIVED NO GROWTH TO DATE CULTURE WILL BE HELD FOR 5 DAYS BEFORE ISSUING A FINAL NEGATIVE REPORT   Report Status PENDING   Incomplete  CULTURE, BLOOD (ROUTINE X 2)     Status: None   Collection Time    09/13/12  9:55 AM      Result Value Range Status   Specimen Description BLOOD LEFT HAND   Final   Special Requests BOTTLES DRAWN AEROBIC ONLY 10CC   Final   Culture  Setup Time 09/13/2012 15:32   Final   Culture     Final   Value:        BLOOD CULTURE RECEIVED NO GROWTH TO DATE CULTURE WILL BE  HELD FOR 5 DAYS BEFORE ISSUING A FINAL NEGATIVE REPORT   Report Status PENDING   Incomplete  CLOSTRIDIUM DIFFICILE BY PCR     Status: None   Collection Time    09/16/12 10:35 AM      Result Value Range Status   C difficile by pcr NEGATIVE  NEGATIVE Final    Medical History: Past Medical History  Diagnosis Date  . Atrial fibrillation   . Diabetes mellitus   . Hypertension   . Coronary artery disease   . Hyperlipemia   . ICD (implantable cardiac defibrillator) in place   . CHF (congestive heart failure)      Assessment: 80yom admitted with sepsis syndrome from staph aureus bacteremia.  He is currently being treated with Vancomycin.  He has HF with EF 25% and A-Fib now in RVR.  He has been started on low dose carvediolol but has soft BP preventing dose titration.  Digoxin will be started to aid in rate control.  HR 150, BP 100-150/60-80, Cr 0.6 CrCl approx 69ml/min.  He is anticoagulated with enoxaparin bridge to warfarin.   Goal of Therapy:  Digoxin level < 1  Plan:  Digoxin 0.25mg  iv x1 tonight Digoxin 0.125mg  po daily starting 4/5  Leota Sauers Pharm.D. CPP, BCPS Clinical  Pharmacist [phone] 09/16/2012 7:09 PM

## 2012-09-16 NOTE — Progress Notes (Addendum)
Subjective/Objective Patient experience runs of Vtach.  AICD fired x 2. Patient currently denies chest pain or dyspnea.  Scheduled Meds: . antiseptic oral rinse  15 mL Mouth Rinse q12n4p  . carvedilol  6.25 mg Oral BID WC  . digoxin  0.25 mg Intravenous Once  . [START ON 09/17/2012] digoxin  0.125 mg Oral Daily  . enoxaparin (LOVENOX) injection  100 mg Subcutaneous Q12H  . feeding supplement  30 mL Oral BID  . feeding supplement  1 Container Oral TID WC  . furosemide  20 mg Intravenous Once  . insulin aspart  0-9 Units Subcutaneous Q6H  . multivitamin with minerals  1 tablet Oral Daily  . nutrition supplement  1 packet Oral BID BM  . vancomycin  1,000 mg Intravenous Q12H  . Warfarin - Pharmacist Dosing Inpatient   Does not apply q1800   Continuous Infusions:  PRN Meds:LORazepam, morphine injection, polyethylene glycol, sodium chloride, sodium phosphate  Vital signs in last 24 hours: Temp:  [97.5 F (36.4 C)-99.8 F (37.7 C)] 99.8 F (37.7 C) (04/04 1636) Pulse Rate:  [103-151] 130 (04/04 1930) Resp:  [17-24] 24 (04/04 1930) BP: (73-150)/(41-86) 133/60 mmHg (04/04 1930) SpO2:  [92 %-100 %] 94 % (04/04 1930) Weight:  [109.498 kg (241 lb 6.4 oz)] 109.498 kg (241 lb 6.4 oz) (04/03 2119)  Intake/Output last 3 shifts: I/O last 3 completed shifts: In: 460 [P.O.:460] Out: 2301 [Urine:2200; Drains:100; Stool:1] Intake/Output this shift:   Physical Examination: General appearance - ill-appearing and pale Chest - clear to auscultation, no wheezes, rales or rhonchi, symmetric air entry, rales noted bilaterally. Heart - Tachycardic, irregular rate Extremities - peripheral pulses normal, no pedal edema, no clubbing or cyanosis, pedal edema 1 +   Problem Assessment/Plan  Ventricular tachycardia, episodic which was treated by implanted AICD. Continues tachycardic (140s) BP 130/68. Patient is to receive 1st dose of digoxin now. Will continue to monitor.   Anemia. Per Cardiology anemia  may be contributing to tachycardia and PRBCs have already been ordered to be given. Will follow with OTO IV Lasix.  Addendum: (2130 pm) continues with frequent runs of Vtach. Patient remains alert and oriented and reports that AICD has fired "several" times this evening. Contacted Cardiology, Dr. Antoine Poche, who recommended transfer to stepdown and to start treatment with Amiodorone. Wife was contacted as requested by Mr. Kina and is planning on returning to hospital. She is aware and in agreement with transfer to stepdown (2900). Mr. Gassett expressed his wish that his wife make the decision regarding transfer and any further care decisions.

## 2012-09-16 NOTE — Progress Notes (Signed)
[facility]    Subjective: Had diarrhea but C difficile PCR negative   Antibiotics:  Anti-infectives   Start     Dose/Rate Route Frequency Ordered Stop   09/13/12 1000  vancomycin (VANCOCIN) IVPB 1000 mg/200 mL premix     1,000 mg 200 mL/hr over 60 Minutes Intravenous Every 12 hours 09/13/12 0829     09/11/12 1400  piperacillin-tazobactam (ZOSYN) IVPB 3.375 g  Status:  Discontinued     3.375 g 12.5 mL/hr over 240 Minutes Intravenous 3 times per day 09/11/12 1111 09/12/12 0831   09/11/12 1400  vancomycin (VANCOCIN) IVPB 1000 mg/200 mL premix  Status:  Discontinued     1,000 mg 200 mL/hr over 60 Minutes Intravenous Every 24 hours 09/11/12 1123 09/13/12 0829   09/10/12 1400  vancomycin (VANCOCIN) 1,500 mg in sodium chloride 0.9 % 500 mL IVPB     1,500 mg 250 mL/hr over 120 Minutes Intravenous  Once 09/10/12 1223 09/10/12 1534   09/10/12 1300  piperacillin-tazobactam (ZOSYN) IVPB 2.25 g  Status:  Discontinued     2.25 g 100 mL/hr over 30 Minutes Intravenous 3 times per day 09/10/12 1223 09/11/12 1111      Medications: Scheduled Meds: . antiseptic oral rinse  15 mL Mouth Rinse q12n4p  . carvedilol  6.25 mg Oral BID WC  . enoxaparin (LOVENOX) injection  100 mg Subcutaneous Q12H  . feeding supplement  30 mL Oral BID  . feeding supplement  1 Container Oral TID WC  . insulin aspart  0-9 Units Subcutaneous Q6H  . multivitamin with minerals  1 tablet Oral Daily  . nutrition supplement  1 packet Oral BID BM  . vancomycin  1,000 mg Intravenous Q12H  . Warfarin - Pharmacist Dosing Inpatient   Does not apply q1800   Continuous Infusions:   PRN Meds:.LORazepam, morphine injection, polyethylene glycol, sodium chloride, sodium phosphate   Objective: Weight change: 16 lb 12 oz (7.598 kg)  Intake/Output Summary (Last 24 hours) at 09/16/12 1803 Last data filed at 09/16/12 1636  Gross per 24 hour  Intake      0 ml  Output   1925 ml  Net  -1925 ml    Blood pressure 150/86, pulse 151, temperature 99.8 F (37.7 C), temperature source Oral, resp. rate 17, height 5\' 11"  (1.803 m), weight 241 lb 6.4 oz (109.498 kg), SpO2 92.00%. Temp:  [97.5 F (36.4 C)-99.8 F (37.7 C)] 99.8 F (37.7 C) (04/04 1636) Pulse Rate:  [103-151] 151 (04/04 1636) Resp:  [17-22] 17 (04/04 1636) BP: (73-150)/(41-86) 150/86 mmHg (04/04 1636) SpO2:  [92 %-100 %] 92 % (04/04 1636) Weight:  [241 lb 6.4 oz (109.498 kg)] 241 lb 6.4 oz (109.498 kg) (04/03 2119)  Physical Exam: HEENT: anicteric sclera, pupils reactive to light and accommodation, EOMI, oropharynx clear and without exudate  CVS irr, irr, normal r, no murmur rubs or gallops  Chest: clear to auscultation bilaterally, no wheezing, rales or rhonchi  Abdomen: soft nontender, nondistended, normal bowel sounds,   Skin: Wound not examined today. Neuro: nonfocal,   Lab Results:  Recent Labs  09/15/12 0547  WBC 10.2  HGB 8.1*  HCT 24.6*  PLT 272    BMET  Recent Labs  09/15/12 0547  NA 141  K 4.0  CL 111  CO2 26  GLUCOSE 128*  BUN 23  CREATININE 0.66  CALCIUM 7.8*    Micro Results: Recent Results (from the past 240 hour(s))  URINE CULTURE  Status: None   Collection Time    09/10/12 11:39 AM      Result Value Range Status   Specimen Description URINE, CATHETERIZED   Final   Special Requests NONE   Final   Culture  Setup Time 09/10/2012 17:34   Final   Colony Count >=100,000 COLONIES/ML   Final   Culture     Final   Value: METHICILLIN RESISTANT STAPHYLOCOCCUS AUREUS     Note: RIFAMPIN AND GENTAMICIN SHOULD NOT BE USED AS SINGLE DRUGS FOR TREATMENT OF STAPH INFECTIONS. CRITICAL RESULT CALLED TO, READ BACK BY AND VERIFIED WITH: TY H@1156  ON [id number] BY [facility]   Report Status 09/12/2012 FINAL   Final   Organism ID, Bacteria METHICILLIN RESISTANT STAPHYLOCOCCUS AUREUS   Final  MRSA PCR SCREENING     Status: None   Collection Time    09/10/12  1:49 PM      Result Value Range Status    MRSA by PCR NEGATIVE  NEGATIVE Final   Comment:            The GeneXpert MRSA Assay (FDA     approved for NASAL specimens     only), is one component of a     comprehensive MRSA colonization     surveillance program. It is not     intended to diagnose MRSA     infection nor to guide or     monitor treatment for     MRSA infections.  CULTURE, BLOOD (ROUTINE X 2)     Status: None   Collection Time    09/13/12  9:45 AM      Result Value Range Status   Specimen Description BLOOD LEFT ARM   Final   Special Requests BOTTLES DRAWN AEROBIC AND ANAEROBIC 10CC   Final   Culture  Setup Time 09/13/2012 15:32   Final   Culture     Final   Value:        BLOOD CULTURE RECEIVED NO GROWTH TO DATE CULTURE WILL BE HELD FOR 5 DAYS BEFORE ISSUING A FINAL NEGATIVE REPORT   Report Status PENDING   Incomplete  CULTURE, BLOOD (ROUTINE X 2)     Status: None   Collection Time    09/13/12  9:55 AM      Result Value Range Status   Specimen Description BLOOD LEFT HAND   Final   Special Requests BOTTLES DRAWN AEROBIC ONLY 10CC   Final   Culture  Setup Time 09/13/2012 15:32   Final   Culture     Final   Value:        BLOOD CULTURE RECEIVED NO GROWTH TO DATE CULTURE WILL BE HELD FOR 5 DAYS BEFORE ISSUING A FINAL NEGATIVE REPORT   Report Status PENDING   Incomplete  CLOSTRIDIUM DIFFICILE BY PCR     Status: None   Collection Time    09/16/12 10:35 AM      Result Value Range Status   C difficile by pcr NEGATIVE  NEGATIVE Final    Studies/Results: Dg Chest Port 1 View  09/15/2012  *RADIOLOGY REPORT*  Clinical Data: Confirm PICC line placement  PORTABLE CHEST - 1 VIEW  Comparison: 09/10/2012  Findings: Right arm PICC terminates at the cavoatrial junction.  Increased interstitial markings/bronchitic changes.  Mild patchy opacity at the left lung base, likely atelectasis.  No pleural effusion or pneumothorax.  Mild cardiomegaly.  Left subclavian ICD.  IMPRESSION: Right arm PICC terminates at the cavoatrial junction.    Original Report  Authenticated By: Charline Bills, M.D.       Assessment/Plan: Troy Cook is a 79 y.o. male with  MRSA having  recovered on multiple occasions from his urine raising the specter of an occult undiagnosed methicillin-resistant staph aureus bacteremia. He was admitted with sepsis and multi-organ disease, including confusion and renal insufficiency. He certainly has a large decubitus ulcer which could be the source of his bacteremia. He also has an ICD which we were worried might be checked it. Fortunately his transesophageal echocardiogram does not show any evidence of vegetations on his heart valves or his pacer wires.  #1 Likely occult MRSA bacteremia: TEE is clean. He is discussed extensively with Dr. Eldridge Dace and Dr. Ladona Ridgel and we all agree that reasonable approach at this point is:  --6 weeks of IV vancomycin to be dosed thru May 13 --He should have weekly CBC BMP and vancomycin troughs faxed to Dr. Algis Liming at [phone]. --then stop abx and repeat surveillance cultures 2 weeks after last dose of abx   #2 Stage IV decubitus ulcer:given depth =  osteo   --will give 6 week course of IV abx --depending on goals of care one could consider plastic surgery referral  And general surgery referral but I will defer this to primary team  I will arrange PICC line and ask his management to arrange IV antibiotics will see the patient one week post hospital discharge and again 2 weeks after completing his IV abx  #3 Diarrhea: C difficile PCR negative  I will sign off for now please call with further questions.         LOS: 6 days   Acey Lav 09/16/2012, 6:03 PM

## 2012-09-16 NOTE — Clinical Social Work Psychosocial (Signed)
Clinical Social Work Department BRIEF PSYCHOSOCIAL ASSESSMENT 09/16/2012  Patient:  JEDIDIAH, DEMARTINI     Account Number:  [id number]     Admit date:  09/10/2012  Clinical Social Worker:  Delmer Islam  Date/Time:  09/16/2012 07:09 AM  Referred by:  Physician  Date Referred:  09/14/2012 Referred for  SNF Placement   Other Referral:   Interview type:  Other - See comment Other interview type:   Elsie Lincoln, SW [facility]    PSYCHOSOCIAL DATA Living Status:  FACILITY Admitted from facility:  [facility] Level of care:  Skilled Nursing Facility Primary support name:  Yavuz Kirby Primary support relationship to patient:  SPOUSE Degree of support available:   Good    CURRENT CONCERNS Current Concerns  Post-Acute Placement   Other Concerns:    SOCIAL WORK ASSESSMENT / PLAN CSW talked with Elsie Lincoln at [facility] who confirmed that patient is from their skilled facility and can return at discharge. Per Florentina Addison, family is not holding a bed.but she is sure they will have a bed available when patient ready for discharge.   Assessment/plan status:  Psychosocial Support/Ongoing Assessment of Needs Other assessment/ plan:   Information/referral to community resources:    PATIENT'S/FAMILY'S RESPONSE TO PLAN OF CARE: Patient can return to SNF when medically stable.

## 2012-09-16 NOTE — Clinical Social Work Note (Signed)
CSW talked with Elsie Lincoln at [facility] regarding patient discharge and she was advised that patient will be ready on Monday. Ms. Camila Li indicated that they will have things in place for patient to return to SNF on Monday. Attending advised that SNF aware of Monday discharge.  Genelle Bal, MSW, LCSW [phone]

## 2012-09-16 NOTE — Progress Notes (Signed)
ANTICOAGULATION CONSULT NOTE - FOLLOW-UP  Pharmacy Consult:  Coumadin Indication: atrial fibrillation  No Known Allergies  Patient Measurements: Height: 5\' 11"  (180.3 cm) Weight: 241 lb 6.4 oz (109.498 kg) IBW/kg (Calculated) : 75.3  Vital Signs: Temp: 97.5 F (36.4 C) (04/04 0442) Temp src: Oral (04/04 0442) BP: 84/41 mmHg (04/04 0442) Pulse Rate: 115 (04/04 0442)  Labs:  Recent Labs  09/14/12 0510 09/15/12 0547 09/16/12 0544  HGB  --  8.1*  --   HCT  --  24.6*  --   PLT  --  272  --   LABPROT 18.7* 19.3* 19.0*  INR 1.62* 1.69* 1.65*  CREATININE  --  0.66  --     Estimated Creatinine Clearance: 95.8 ml/min (by C-G formula based on Cr of 0.66).     Assessment: Troy Cook on chronic Coumadin for history of Afib admitted with INR of 5.66 due to drug interaction with Bactrim.  His home Coumadin dose was recently changed to 8mg  daily but it isn't completely clear what he was on prior to this. Lovenox bridge therapy started 4/2 due to sub-therapeutic INR post multiple days of held therapy.  INR remains sub-therapeutic today.  No bleeding reported.   Goal of Therapy:  INR 2-3 Vanc trough 15 - 20 mcg/mL   Plan:  - Coumadin 10mg  PO today - Continue Lovenox 100mg  SQ Q12H until INR therapeutic - Daily PT / INR - Continue Vanc 1gm IV Q12H through 10/25/12 - Monitor renal fxn and PRN VT to ensure level remains therapeutic - F/U hold Coreg d/t hypotension     Harel Repetto D. Laney Potash, PharmD, BCPS Pager:  [phone] 09/16/2012, 8:44 AM

## 2012-09-16 NOTE — Progress Notes (Addendum)
SUBJECTIVE:  Called to see patient with Vtach and ICD firing.  Telemetry confirms at least three of these episodes.  He reports seven.  He is not having any pain but he is having mild increased dyspnea.     PHYSICAL EXAM Filed Vitals:   09/16/12 2012 09/16/12 2044 09/16/12 2130 09/16/12 2230  BP: 131/75 139/73 124/71 121/66  Pulse: 145 142 114 132  Temp: 99.7 F (37.6 C) 99.1 F (37.3 C) 98.9 F (37.2 C) 98.2 F (36.8 C)  TempSrc: Oral Oral Oral Oral  Resp: 30 26 26  32  Height: 5\' 11"  (1.803 m)     Weight: 225 lb 12 oz (102.4 kg)     SpO2: 95%      General:  Frail Lungs:  Decreased breath sounds Heart:  Irregular Abdomen:  Positive bowel sounds, no rebound no guarding Extremities:  Diffuse edema  LABS:  Results for orders placed during the hospital encounter of 09/10/12 (from the past 24 hour(s))  GLUCOSE, CAPILLARY     Status: Abnormal   Collection Time    09/15/12 11:36 PM      Result Value Range   Glucose-Capillary 156 (*) 70 - 99 mg/dL  PROTIME-INR     Status: Abnormal   Collection Time    09/16/12  5:44 AM      Result Value Range   Prothrombin Time 19.0 (*) 11.6 - 15.2 seconds   INR 1.65 (*) 0.00 - 1.49  C-REACTIVE PROTEIN     Status: Abnormal   Collection Time    09/16/12  5:44 AM      Result Value Range   CRP 13.8 (*) <0.60 mg/dL  SEDIMENTATION RATE     Status: Abnormal   Collection Time    09/16/12  5:44 AM      Result Value Range   Sed Rate 123 (*) 0 - 16 mm/hr  GLUCOSE, CAPILLARY     Status: Abnormal   Collection Time    09/16/12  6:07 AM      Result Value Range   Glucose-Capillary 162 (*) 70 - 99 mg/dL  CLOSTRIDIUM DIFFICILE BY PCR     Status: None   Collection Time    09/16/12 10:35 AM      Result Value Range   C difficile by pcr NEGATIVE  NEGATIVE  GLUCOSE, CAPILLARY     Status: Abnormal   Collection Time    09/16/12 11:42 AM      Result Value Range   Glucose-Capillary 161 (*) 70 - 99 mg/dL  TYPE AND SCREEN     Status: None   Collection  Time    09/16/12  3:50 PM      Result Value Range   ABO/RH(D) AB POS     Antibody Screen NEG     Sample Expiration 09/19/2012     Unit Number [id number]     Blood Component Type RED CELLS,LR     Unit division 00     Status of Unit ISSUED     Transfusion Status OK TO TRANSFUSE     Crossmatch Result Compatible    PREPARE RBC (CROSSMATCH)     Status: None   Collection Time    09/16/12  3:50 PM      Result Value Range   Order Confirmation ORDER PROCESSED BY BLOOD BANK    ABO/RH     Status: None   Collection Time    09/16/12  3:50 PM      Result Value Range  ABO/RH(D) AB POS    GLUCOSE, CAPILLARY     Status: Abnormal   Collection Time    09/16/12  4:29 PM      Result Value Range   Glucose-Capillary 188 (*) 70 - 99 mg/dL    Intake/Output Summary (Last 24 hours) at 09/16/12 2251 Last data filed at 09/16/12 1831  Gross per 24 hour  Intake     60 ml  Output   2275 ml  Net  -2215 ml     ASSESSMENT AND PLAN:  VTACH:  Transfer to ICU and start IV amiodarone.  Check magnesium.    After transfer to the unit the patient has continued to have firing of the ICD.  His wife is at the bedside.  The patient and his wife want the ICD turned off. They understand that without cardioversion the patient is likely to develop ventricular tachycardia and likely die from this.  He is calm and accepting this.  This is his wish and he is alert and able to make this decision.  He is a DNR.   He is having, in addition to ventricular tachycardia, he is having atrial fibrillation with rapid rate.  He has received two boluses of IV amiodarone and he has an amiodarone continuous infusion.  We will continue to support and treat this.     Rollene Rotunda 09/16/2012 10:51 PM

## 2012-09-16 NOTE — Progress Notes (Signed)
Patients heart rate has been sinus tach in the high 140's to 158 per centralized telemetry. Patient is asymptomatic and stable. Dr Benjamine Mola has been notified of elevated heart rate. Parameters have been set at 150 until after patient receives 1 unit of RBC and then this will be reassessed. This is per MD order. Patient will continue to be monitored.

## 2012-09-16 NOTE — Progress Notes (Signed)
Patient had 54 beats of Vtach at 1856. Patient stated that ICD fired. Patient was asymptomatic, no chest pain or SOB. Doctor on call was notified. Rapid response was called to come monitor the patient. Again patient had 35 beats of vtach at 1905. And 48 beats of vtach at 1908. Patient again stated that his ICD fired. Patients vitals 133/60, 130, 24, 94 on 3 L. NP and rapid response both came to patients bedside and assessed patient and placed ordered. Report was given to oncoming RN, Lurena Joiner. Patient is currently stable. Patient will continued to be monitored.

## 2012-09-16 NOTE — Progress Notes (Addendum)
[facility] PROGRESS NOTE  Troy Cook NWG:[id number] DOB: 09-May-1932 DOA: 09/10/2012 PCP: Daisy Floro, MD  Assessment/Plan: Severe Sepsis - multifactorial - MRSA UTI, - patient most likely has staph aureus bacteremia which will explain why he had persistent mrsa utis at SNF priro to admission to [place]. Started empiric iv vancomycin and Zosyn on admission . Zosyn stopped 3/31 after culture results back. Continue Vancomycin iv . ID consultation:   -TEE negative --given ICD presence and likely occult bacteremia,  treat for 6 weeks with IV vancomycin  -PICC Line He should have weekly CBC BMP and vancomycin troughs faxed to Dr. Algis Liming at [phone].  --then stop abx and repeat surveillance cultures 2 weeks after last dose of abx - outpatient referral to plastic surgeon- family states they do not want to see a plastic surgeon until his infection in his blood is cleared   SBO - probable distal obstruction - ? From constipation vs adhesions from prior surgery. Had NG tube, suctioning, bowel rest from 3/29-3/30 with minimal improvement and minimal fluid suctioned . NG tube removed 09/11/12. patient is too ill for surgical intervention. Constipation resolved by 3/31 and SBO resolved .   AKI - multifactorial - suspect from direct infectious injury to the tubules after persistent staph aureus infection , combined with dehydration - iv fluids and closely monitor status - improved by 09/11/12. Iv fluids stopped 09/12/12. Good UOP  Stage IV decub ulcer - air mattress overlay. wound care consult obtained on 3/30 and patient started hydrotherapy 3/31, wound vac placed 4/3, ? outpatient referral to plastic surgery   Cardiomyopathy s/p AICD - last EF 35%. Medical management   Hyperkalemia secondary to renal failure - resolved with D50 and insulin and hydration   Hyponatremia from dehydration - resolved   DM 2 - SSI   Afib with RVR - decompensated from sepsis - started low dose coreg but has been  held secondary to dec BP.  lovenox- bridging to coumadin- defer management to cardiology  Patient has multiple systems and organ failure, he has been deconditioned, bed bound prior to this admission.Palliative care consult called 3/30 No plans for pressors, ICU transfer, surgery,  Leucocytosis: improved  Code Status: DNR Family Communication: patient Disposition Plan: SNF by Monday?   Consultants:  EP  cards  Procedures:  TEE 4/2  Antibiotics:  vanc  HPI/Subjective: Feeling better today Refers to SNF as "hellhole"   Objective: Filed Vitals:   09/15/12 1336 09/15/12 1645 09/15/12 2119 09/16/12 0442  BP: 120/61 110/56 73/58 84/41   Pulse: 105 110 103 115  Temp: 98.2 F (36.8 C) 97.2 F (36.2 C) 97.8 F (36.6 C) 97.5 F (36.4 C)  TempSrc: Oral Oral Oral Oral  Resp: 24 22 22 20   Height:      Weight:   109.498 kg (241 lb 6.4 oz)   SpO2: 100% 99% 100% 100%    Intake/Output Summary (Last 24 hours) at 09/16/12 0850 Last data filed at 09/15/12 2030  Gross per 24 hour  Intake    400 ml  Output     26 ml  Net    374 ml   Filed Weights   09/13/12 2106 09/14/12 2017 09/15/12 2119  Weight: 102.799 kg (226 lb 10.1 oz) 101.9 kg (224 lb 10.4 oz) 109.498 kg (241 lb 6.4 oz)    Exam:   General:  awake  Cardiovascular: irr  Respiratory:clear, no wheezing  Abdomen: +BS, soft, NT  Musculoskeletal: moves all 4 ext - very weak  Data Reviewed:  Basic Metabolic Panel:  Recent Labs Lab 09/10/12 1004 09/11/12 0625 09/13/12 0455 09/15/12 0547  NA 130* 137 141 141  K 6.0* 4.4 4.5 4.0  CL 95* 105 109 111  CO2 18* 20 24 26   GLUCOSE 219* 138* 130* 128*  BUN 104* 82* 37* 23  CREATININE 3.38* 1.88* 0.88 0.66  CALCIUM 9.2 8.5 8.3* 7.8*   Liver Function Tests:  Recent Labs Lab 09/10/12 1004  AST 8  ALT 9  ALKPHOS 92  BILITOT 0.3  PROT 7.7  ALBUMIN 2.1*    Recent Labs Lab 09/10/12 1004  LIPASE 15   No results found for this basename: AMMONIA,  in the  last 168 hours CBC:  Recent Labs Lab 09/10/12 1004 09/11/12 0625 09/13/12 0455 09/15/12 0547  WBC 45.4* 26.2* 14.4* 10.2  NEUTROABS 43.6*  --   --   --   HGB 11.6* 9.4* 9.0* 8.1*  HCT 33.4* 27.4* 27.9* 24.6*  MCV 81.3 82.8 86.6 86.0  PLT 427* 337 322 272   Cardiac Enzymes: No results found for this basename: CKTOTAL, CKMB, CKMBINDEX, TROPONINI,  in the last 168 hours BNP (last 3 results)  Recent Labs  10/26/11 1440  PROBNP 2287.0*   CBG:  Recent Labs Lab 09/15/12 0600 09/15/12 1134 09/15/12 1759 09/15/12 2336 09/16/12 0607  GLUCAP 132* 149* 198* 156* 162*    Recent Results (from the past 240 hour(s))  URINE CULTURE     Status: None   Collection Time    09/10/12 11:39 AM      Result Value Range Status   Specimen Description URINE, CATHETERIZED   Final   Special Requests NONE   Final   Culture  Setup Time 09/10/2012 17:34   Final   Colony Count >=100,000 COLONIES/ML   Final   Culture     Final   Value: METHICILLIN RESISTANT STAPHYLOCOCCUS AUREUS     Note: RIFAMPIN AND GENTAMICIN SHOULD NOT BE USED AS SINGLE DRUGS FOR TREATMENT OF STAPH INFECTIONS. CRITICAL RESULT CALLED TO, READ BACK BY AND VERIFIED WITH: TY H@1156  ON [id number] BY [facility]   Report Status 09/12/2012 FINAL   Final   Organism ID, Bacteria METHICILLIN RESISTANT STAPHYLOCOCCUS AUREUS   Final  MRSA PCR SCREENING     Status: None   Collection Time    09/10/12  1:49 PM      Result Value Range Status   MRSA by PCR NEGATIVE  NEGATIVE Final   Comment:            The GeneXpert MRSA Assay (FDA     approved for NASAL specimens     only), is one component of a     comprehensive MRSA colonization     surveillance program. It is not     intended to diagnose MRSA     infection nor to guide or     monitor treatment for     MRSA infections.  CULTURE, BLOOD (ROUTINE X 2)     Status: None   Collection Time    09/13/12  9:45 AM      Result Value Range Status   Specimen Description BLOOD LEFT ARM   Final    Special Requests BOTTLES DRAWN AEROBIC AND ANAEROBIC 10CC   Final   Culture  Setup Time 09/13/2012 15:32   Final   Culture     Final   Value:        BLOOD CULTURE RECEIVED NO GROWTH TO DATE CULTURE WILL BE HELD FOR 5 DAYS BEFORE  ISSUING A FINAL NEGATIVE REPORT   Report Status PENDING   Incomplete  CULTURE, BLOOD (ROUTINE X 2)     Status: None   Collection Time    09/13/12  9:55 AM      Result Value Range Status   Specimen Description BLOOD LEFT HAND   Final   Special Requests BOTTLES DRAWN AEROBIC ONLY 10CC   Final   Culture  Setup Time 09/13/2012 15:32   Final   Culture     Final   Value:        BLOOD CULTURE RECEIVED NO GROWTH TO DATE CULTURE WILL BE HELD FOR 5 DAYS BEFORE ISSUING A FINAL NEGATIVE REPORT   Report Status PENDING   Incomplete     Studies: Dg Chest Port 1 View  09/15/2012  *RADIOLOGY REPORT*  Clinical Data: Confirm PICC line placement  PORTABLE CHEST - 1 VIEW  Comparison: 09/10/2012  Findings: Right arm PICC terminates at the cavoatrial junction.  Increased interstitial markings/bronchitic changes.  Mild patchy opacity at the left lung base, likely atelectasis.  No pleural effusion or pneumothorax.  Mild cardiomegaly.  Left subclavian ICD.  IMPRESSION: Right arm PICC terminates at the cavoatrial junction.   Original Report Authenticated By: Charline Bills, M.D.     Scheduled Meds: . antiseptic oral rinse  15 mL Mouth Rinse q12n4p  . carvedilol  6.25 mg Oral BID WC  . enoxaparin (LOVENOX) injection  100 mg Subcutaneous Q12H  . feeding supplement  30 mL Oral BID  . feeding supplement  1 Container Oral TID WC  . insulin aspart  0-9 Units Subcutaneous Q6H  . metoCLOPramide (REGLAN) injection  10 mg Intravenous Q6H  . multivitamin with minerals  1 tablet Oral Daily  . nutrition supplement  1 packet Oral BID BM  . polyethylene glycol  17 g Oral Daily  . vancomycin  1,000 mg Intravenous Q12H  . warfarin  10 mg Oral ONCE-1800  . Warfarin - Pharmacist Dosing Inpatient    Does not apply q1800   Continuous Infusions:    Principal Problem:   Sepsis Active Problems:   Normocytic anemia   Dyslipidemia   Atrial fibrillation   ICD (implantable cardiac defibrillator) in place   Chronic systolic heart failure   Pressure ulcer stage IV   UTI (urinary tract infection)   Hyperkalemia   SBO (small bowel obstruction)   AKI (acute kidney injury)   MRSA infection   MRSA (methicillin resistant Staphylococcus aureus) septicemia   Bacteremia   ICD (implantable cardiac defibrillator) infection   Diabetes    Time spent: 35    [facility], Alyah Boehning  [facility] Pager [phone]. If 7PM-7AM, please contact night-coverage at [email or web address], password [facility] 09/16/2012, 8:50 AM  LOS: 6 days

## 2012-09-17 DIAGNOSIS — R0989 Other specified symptoms and signs involving the circulatory and respiratory systems: Secondary | ICD-10-CM

## 2012-09-17 LAB — BASIC METABOLIC PANEL
BUN: 25 mg/dL — ABNORMAL HIGH (ref 6–23)
Calcium: 7.7 mg/dL — ABNORMAL LOW (ref 8.4–10.5)
Creatinine, Ser: 0.57 mg/dL (ref 0.50–1.35)
GFR calc Af Amer: >90 mL/min (ref >90)
GFR calc non Af Amer: >90 mL/min (ref >90)

## 2012-09-17 LAB — TYPE AND SCREEN: ABO/RH(D): AB POS

## 2012-09-17 MED ORDER — SODIUM CHLORIDE 0.9 % IV SOLN
1.0000 mg/h | INTRAVENOUS | Status: DC
  Filled 2012-09-17: qty 10

## 2012-09-17 MED ORDER — AMIODARONE IV BOLUS ONLY 150 MG/100ML
150.0000 mg | Freq: Once | INTRAVENOUS | Status: AC
  Administered 2012-09-17: 150 mg via INTRAVENOUS

## 2012-09-17 MED ORDER — MORPHINE SULFATE 2 MG/ML IJ SOLN
1.0000 mg | INTRAMUSCULAR | Status: DC | PRN
  Administered 2012-09-17 (×3): 1 mg via INTRAVENOUS

## 2012-09-17 MED ORDER — MORPHINE BOLUS VIA INFUSION
1.0000 mg | INTRAVENOUS | Status: DC | PRN
  Filled 2012-09-17: qty 1

## 2012-09-17 MED ORDER — MORPHINE SULFATE 2 MG/ML IJ SOLN: 1.0000 mg | INTRAMUSCULAR | Status: DC | PRN

## 2012-09-17 MED ORDER — MAGNESIUM SULFATE 40 MG/ML IJ SOLN
2.0000 g | Freq: Once | INTRAMUSCULAR | Status: AC
  Administered 2012-09-17: 2 g via INTRAVENOUS
  Filled 2012-09-17: qty 50

## 2012-09-17 MED ORDER — POTASSIUM CHLORIDE 20 MEQ/15ML (10%) PO LIQD
40.0000 meq | Freq: Once | ORAL | Status: DC
  Filled 2012-09-17: qty 30

## 2012-09-17 MED ORDER — DEXTROSE 5 % IV SOLN
0.3000 mg/h | INTRAVENOUS | Status: DC
  Administered 2012-09-19: 0.5 mg/h via INTRAVENOUS
  Administered 2012-09-20: 0.3 mg/h via INTRAVENOUS
  Filled 2012-09-17 (×2): qty 10

## 2012-09-17 MED ORDER — AMIODARONE LOAD VIA INFUSION
150.0000 mg | Freq: Once | INTRAVENOUS | Status: AC
  Administered 2012-09-17: 150 mg via INTRAVENOUS
  Filled 2012-09-17: qty 83.34

## 2012-09-17 NOTE — Progress Notes (Signed)
PT Cancellation Note  Patient Details Name: ZAI CHMIEL MRN: [id number] DOB: 15-Nov-1931   Cancelled Treatment:    Reason Eval/Treat Not Completed: Medical issues which prohibited therapy (Sign off).     INGOLD,Tyrice Hewitt 09/17/2012, 1:25 PM [facility] Acute Rehabilitation [phone] (pager)

## 2012-09-17 NOTE — Progress Notes (Signed)
Visited w/pt's wife who was bedside as pt was asleep. 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 Pt's wife said they were fine and did not need anything. She said their daughter is also here (but not in the room). When asked if I could do anything for her, she said she would like to see Dr. Ladona Ridgel. I told her I would ck for her and let Dr. Ladona Ridgel know. Marjory Lies Chaplain

## 2012-09-17 NOTE — Progress Notes (Signed)
Stopped by pt room. Pt and visitor were asleep. Will follow-up w/visit. Pls page [phone] if needed. Marjory Lies Chaplain

## 2012-09-17 NOTE — Progress Notes (Addendum)
Subjective:  Events of night reviewed.  Recurrent tachycardia with multiple shocks despite IV amiodarone.  Device currently has magnet off. Patient and wife do not wish to have any more shocks and want device turned off.  Currently he is lying quietly in bed and not SOB.  Objective:  Vital Signs in the last 24 hours: BP 66/40  Pulse 176  Temp(Src) 97.9 F (36.6 C) (Oral)  Resp 30  Ht 5\' 11"  (1.803 m)  Wt 102.4 kg (225 lb 12 oz)  BMI 31.5 kg/m2  SpO2 100%  Physical Exam: Elderly WM lying in bed in NAD Lungs:  Reduced BS Cardiac:  Rapid, regular rhythm, normal S1 and S2,distant heart sounds Abdomen:  Soft, nontender, no masses, obese Extremities: 1-2+edema present  Intake/Output from previous day: 04/04 0701 - 04/05 0700 In: 970 [P.O.:60; I.V.:360; Blood:350; IV Piggyback:200] Out: 3350 [Urine:3275; Drains:75] Weight Filed Weights   09/14/12 2017 09/15/12 2119 09/16/12 2012  Weight: 101.9 kg (224 lb 10.4 oz) 109.498 kg (241 lb 6.4 oz) 102.4 kg (225 lb 12 oz)    Lab Results: Basic Metabolic Panel:  Recent Labs  16/10/96 0547 09/16/12 2330  NA 141 133*  K 4.0 3.1*  CL 111 102  CO2 26 22  GLUCOSE 128* 348*  BUN 23 25*  CREATININE 0.66 0.57    CBC:  Recent Labs  09/15/12 0547  WBC 10.2  HGB 8.1*  HCT 24.6*  MCV 86.0  PLT 272    BNP    Component Value Date/Time   PROBNP 2287.0* 10/26/2011 1440    PROTIME: Lab Results  Component Value Date   INR 1.65* 09/16/2012   INR 1.69* 09/15/2012   INR 1.62* 09/14/2012    Telemetry: Wide complex tachycardia rates 170 -260  Assessment/Plan: 1. Ischemic cardiomyopathy 2. A fib with RVR 3. Recurrent ventricular tachycardia 4. Hypotension likely secondary to rate 5. Hypokalemia and hypomagnesemia  Rec:  I will have the [employer] Rep turn off the defibrillator.  He is a DNR and is for comfort care. With hypotension prognosis is poor. IV mag to see if will help.     Darden Palmer  MD  [facility] Cardiology  09/17/2012, 7:50 AM

## 2012-09-17 NOTE — Progress Notes (Signed)
ICD off per MD request. [facility] [phone]

## 2012-09-17 NOTE — Progress Notes (Signed)
[facility] PROGRESS NOTE  DIERKS WACH ZOX:[id number] DOB: 1931-08-04 DOA: 09/10/2012 PCP: Daisy Floro, MD  Assessment/Plan: Patient's AICD went off last night several times.  He was moved to the ICU where he was made comfort care this AM at the wished of his wife and the patient.  Morphine drip will be started at 1 mg /hr and 1 mg q 1 hour PRN.  Just received morphine and is comfortable appearing currently.      Code Status: DNR Family Communication: patient Disposition Plan: comfort care   Consultants:  EP  cards  Procedures:  TEE 4/2  Antibiotics:  vanc  HPI/Subjective: Opens eyes to voice, wife at bedside    Objective: Filed Vitals:   09/17/12 0645 09/17/12 0700 09/17/12 0715 09/17/12 0730  BP: 55/30 60/35 65/35  68/24  Pulse: 186 181 95 86  Temp:      TempSrc:      Resp: 31 30 23 27   Height:      Weight:      SpO2: 99% 97% 97% 100%    Intake/Output Summary (Last 24 hours) at 09/17/12 0815 Last data filed at 09/17/12 0600  Gross per 24 hour  Intake 1056.7 ml  Output   3350 ml  Net -2293.3 ml   Filed Weights   09/14/12 2017 09/15/12 2119 09/16/12 2012  Weight: 101.9 kg (224 lb 10.4 oz) 109.498 kg (241 lb 6.4 oz) 102.4 kg (225 lb 12 oz)    Exam:   General:  Pale appearing, shallow respirations, wife at bedside   Data Reviewed: Basic Metabolic Panel:  Recent Labs Lab 09/10/12 1004 09/11/12 0625 09/13/12 0455 09/15/12 0547 09/16/12 2330 09/17/12 0117  NA 130* 137 141 141 133*  --   K 6.0* 4.4 4.5 4.0 3.1*  --   CL 95* 105 109 111 102  --   CO2 18* 20 24 26 22   --   GLUCOSE 219* 138* 130* 128* 348*  --   BUN 104* 82* 37* 23 25*  --   CREATININE 3.38* 1.88* 0.88 0.66 0.57  --   CALCIUM 9.2 8.5 8.3* 7.8* 7.7*  --   MG  --   --   --   --   --  1.5   Liver Function Tests:  Recent Labs Lab 09/10/12 1004  AST 8  ALT 9  ALKPHOS 92  BILITOT 0.3  PROT 7.7  ALBUMIN 2.1*    Recent Labs Lab 09/10/12 1004  LIPASE 15    No results found for this basename: AMMONIA,  in the last 168 hours CBC:  Recent Labs Lab 09/10/12 1004 09/11/12 0625 09/13/12 0455 09/15/12 0547  WBC 45.4* 26.2* 14.4* 10.2  NEUTROABS 43.6*  --   --   --   HGB 11.6* 9.4* 9.0* 8.1*  HCT 33.4* 27.4* 27.9* 24.6*  MCV 81.3 82.8 86.6 86.0  PLT 427* 337 322 272   Cardiac Enzymes: No results found for this basename: CKTOTAL, CKMB, CKMBINDEX, TROPONINI,  in the last 168 hours BNP (last 3 results)  Recent Labs  10/26/11 1440  PROBNP 2287.0*   CBG:  Recent Labs Lab 09/15/12 2336 09/16/12 0607 09/16/12 1142 09/16/12 1629 09/17/12 0155  GLUCAP 156* 162* 161* 188* 330*    Recent Results (from the past 240 hour(s))  URINE CULTURE     Status: None   Collection Time    09/10/12 11:39 AM      Result Value Range Status   Specimen Description URINE, CATHETERIZED   Final  Special Requests NONE   Final   Culture  Setup Time 09/10/2012 17:34   Final   Colony Count >=100,000 COLONIES/ML   Final   Culture     Final   Value: METHICILLIN RESISTANT STAPHYLOCOCCUS AUREUS     Note: RIFAMPIN AND GENTAMICIN SHOULD NOT BE USED AS SINGLE DRUGS FOR TREATMENT OF STAPH INFECTIONS. CRITICAL RESULT CALLED TO, READ BACK BY AND VERIFIED WITH: TY H@1156  ON [id number] BY [facility]   Report Status 09/12/2012 FINAL   Final   Organism ID, Bacteria METHICILLIN RESISTANT STAPHYLOCOCCUS AUREUS   Final  MRSA PCR SCREENING     Status: None   Collection Time    09/10/12  1:49 PM      Result Value Range Status   MRSA by PCR NEGATIVE  NEGATIVE Final   Comment:            The GeneXpert MRSA Assay (FDA     approved for NASAL specimens     only), is one component of a     comprehensive MRSA colonization     surveillance program. It is not     intended to diagnose MRSA     infection nor to guide or     monitor treatment for     MRSA infections.  CULTURE, BLOOD (ROUTINE X 2)     Status: None   Collection Time    09/13/12  9:45 AM      Result Value Range  Status   Specimen Description BLOOD LEFT ARM   Final   Special Requests BOTTLES DRAWN AEROBIC AND ANAEROBIC 10CC   Final   Culture  Setup Time 09/13/2012 15:32   Final   Culture     Final   Value:        BLOOD CULTURE RECEIVED NO GROWTH TO DATE CULTURE WILL BE HELD FOR 5 DAYS BEFORE ISSUING A FINAL NEGATIVE REPORT   Report Status PENDING   Incomplete  CULTURE, BLOOD (ROUTINE X 2)     Status: None   Collection Time    09/13/12  9:55 AM      Result Value Range Status   Specimen Description BLOOD LEFT HAND   Final   Special Requests BOTTLES DRAWN AEROBIC ONLY 10CC   Final   Culture  Setup Time 09/13/2012 15:32   Final   Culture     Final   Value:        BLOOD CULTURE RECEIVED NO GROWTH TO DATE CULTURE WILL BE HELD FOR 5 DAYS BEFORE ISSUING A FINAL NEGATIVE REPORT   Report Status PENDING   Incomplete  CLOSTRIDIUM DIFFICILE BY PCR     Status: None   Collection Time    09/16/12 10:35 AM      Result Value Range Status   C difficile by pcr NEGATIVE  NEGATIVE Final  MRSA PCR SCREENING     Status: None   Collection Time    09/17/12  1:23 AM      Result Value Range Status   MRSA by PCR NEGATIVE  NEGATIVE Final   Comment:            The GeneXpert MRSA Assay (FDA     approved for NASAL specimens     only), is one component of a     comprehensive MRSA colonization     surveillance program. It is not     intended to diagnose MRSA     infection nor to guide or     monitor treatment for  MRSA infections.     Studies: Dg Chest Port 1 View  09/16/2012  *RADIOLOGY REPORT*  Clinical Data: Complaint of acute dyspnea.  PORTABLE CHEST - 1 VIEW  Comparison: 09/15/2012  Findings: Stable appearance of cardiac pacemaker and right central venous catheters since previous study.  Shallow inspiration.  Heart size and pulmonary vascularity are normal for age for a effort. There is suggestion of atelectasis or infiltration in the left lung base.  No blunting of costophrenic angles.  No pneumothorax.  Calcification of the aorta.  Degenerative changes in the right shoulder.  IMPRESSION: No significant change since previous study.  Shallow inspiration with atelectasis in the left lung base.   Original Report Authenticated By: Burman Nieves, M.D.    Dg Chest Port 1 View  09/15/2012  *RADIOLOGY REPORT*  Clinical Data: Confirm PICC line placement  PORTABLE CHEST - 1 VIEW  Comparison: 09/10/2012  Findings: Right arm PICC terminates at the cavoatrial junction.  Increased interstitial markings/bronchitic changes.  Mild patchy opacity at the left lung base, likely atelectasis.  No pleural effusion or pneumothorax.  Mild cardiomegaly.  Left subclavian ICD.  IMPRESSION: Right arm PICC terminates at the cavoatrial junction.   Original Report Authenticated By: Charline Bills, M.D.     Scheduled Meds: . magnesium sulfate 1 - 4 g bolus IVPB  2 g Intravenous Once   Continuous Infusions: . amiodarone (NEXTERONE PREMIX) 360 mg/200 mL dextrose 30 mg/hr (09/17/12 0539)  . morphine      Principal Problem:   Sepsis Active Problems:   Normocytic anemia   Dyslipidemia   Atrial fibrillation   ICD (implantable cardiac defibrillator) in place   Chronic systolic heart failure   Pressure ulcer stage IV   UTI (urinary tract infection)   Hyperkalemia   SBO (small bowel obstruction)   AKI (acute kidney injury)   MRSA infection   MRSA (methicillin resistant Staphylococcus aureus) septicemia   Bacteremia   ICD (implantable cardiac defibrillator) infection   Diabetes    Time spent: 35    [facility], Anjelique Makar  [facility] Pager [phone]. If 7PM-7AM, please contact night-coverage at [email or web address], password [facility] 09/17/2012, 8:15 AM  LOS: 7 days

## 2012-09-17 NOTE — Progress Notes (Signed)
Patient and family refusing cares, requesting only comfort cares. Offered to clean and reposition patient multiple times, but both the patient and his wife refused. Also refused glucometers and any type of needle stick (i.e. Lovenox)

## 2012-09-17 NOTE — Progress Notes (Signed)
75mL Morphine wasted in sink with clear water flush as witnessed by.

## 2012-09-17 NOTE — Progress Notes (Signed)
Patient JX:[id number] Troy Cook      DOB: 01/10/1932      NFA:[id number]   Palliative Medicine Team at [facility] Progress Note    Subjective:  Reviewed case early this am with Dr. Benjamine Mola.  Plan of care seemed to require continuous opiate infusion as the patient was dyspneic with runs of VT.  Dr. Benjamine Mola was able to talk with the patient's spouse before I was able to arrive an affirm goals . I spoke with [name] this afternoon and she affirmed full comfort as plan of care.  [name] was somnolent on low dose morphine drip without further evidence for significant dyspnea.  Arrived as chaplain support being offered.     Filed Vitals:   09/17/12 1924  BP:   Pulse:   Temp: 97.4 F (36.3 C)  Resp:    Physical exam:  General: no acute distress Exam deferred  So as not to disturb patient     Assessment and plan: 79 yr old white male with known history of CAD, AICD, CHF, diabetes and recurrent MRSA UTI suspicion for possible bacteriemia.  Patient started to have AICD firing overnight.  Device deactivated by cardiology and amiodarone initiated.  Patient remained dyspneic so a morphine drip was initiated .  Patient is currently very comfortable. Offered support to spouse at the bedside. She was going to head home to freshen up and return.   1.   DNR/ DNI  AICD and PM deactivated  2.  Dyspnea : controlled on low dose morphine no other interventions needed.  Drip can be titrated if not effective  3. Atropine drops can be added for secretions prn.  Total Time 135 pm- 155   Brittnee Gaetano L. Ladona Ridgel, MD MBA The Palliative Medicine Team at [facility] Phone: [phone] Pager: [phone]

## 2012-09-17 NOTE — Progress Notes (Signed)
Patient ZO:XWRUEA LORCAN SHELP      DOB: 28-Oct-1931      VWU:[id number]  Noted ICD firing over night will reengage sometime today to support comfort needs.  Ottis Vacha L. Ladona Ridgel, MD MBA The Palliative Medicine Team at [facility] Phone: [phone] Pager: [phone]

## 2012-09-18 MED ORDER — ATROPINE SULFATE 1 % OP SOLN
4.0000 [drp] | OPHTHALMIC | Status: DC | PRN
  Filled 2012-09-18 (×2): qty 2

## 2012-09-18 MED ORDER — AMIODARONE HCL 200 MG PO TABS
200.0000 mg | ORAL_TABLET | Freq: Two times a day (BID) | ORAL | Status: DC
  Administered 2012-09-18 – 2012-09-22 (×8): 200 mg via ORAL
  Filled 2012-09-18 (×11): qty 1

## 2012-09-18 NOTE — Progress Notes (Signed)
[facility] PROGRESS NOTE  Troy Cook:[id number] DOB: February 18, 1932 DOA: 09/10/2012 PCP: Daisy Floro, MD  Assessment/Plan: Patient's AICD went off last night several times.  He was moved to the ICU where he was made comfort care this AM at the wished of his wife and the patient.  Morphine drip will be started at 1 mg /hr and 1 mg q 30 min PRN.  comfortable appearing.  Po amiodarone added for comfort.     Code Status: DNR Family Communication: patient Disposition Plan: comfort care- ? Transfer to floor later today   Consultants:  EP  Cards  palliative  Procedures:  TEE 4/2  Antibiotics:  vanc  HPI/Subjective: Says he is tired     Objective: Filed Vitals:   09/17/12 1931 09/17/12 2335 09/17/12 2347 09/18/12 0235  BP: 72/40 82/48 77/39  85/48  Pulse: 88 84 81 90  Temp:   97.4 F (36.3 C) 97.4 F (36.3 C)  TempSrc:   Oral Oral  Resp: 17 17 18 15   Height:      Weight:      SpO2: 100% 98% 100% 100%    Intake/Output Summary (Last 24 hours) at 09/18/12 0813 Last data filed at 09/18/12 0000  Gross per 24 hour  Intake      0 ml  Output    500 ml  Net   -500 ml   Filed Weights   09/14/12 2017 09/15/12 2119 09/16/12 2012  Weight: 101.9 kg (224 lb 10.4 oz) 109.498 kg (241 lb 6.4 oz) 102.4 kg (225 lb 12 oz)    Exam:   General:  Pale appearing, NAD   Data Reviewed: Basic Metabolic Panel:  Recent Labs Lab 09/13/12 0455 09/15/12 0547 09/16/12 2330 09/17/12 0117  NA 141 141 133*  --   K 4.5 4.0 3.1*  --   CL 109 111 102  --   CO2 24 26 22   --   GLUCOSE 130* 128* 348*  --   BUN 37* 23 25*  --   CREATININE 0.88 0.66 0.57  --   CALCIUM 8.3* 7.8* 7.7*  --   MG  --   --   --  1.5   Liver Function Tests: No results found for this basename: AST, ALT, ALKPHOS, BILITOT, PROT, ALBUMIN,  in the last 168 hours No results found for this basename: LIPASE, AMYLASE,  in the last 168 hours No results found for this basename: AMMONIA,  in the last  168 hours CBC:  Recent Labs Lab 09/13/12 0455 09/15/12 0547  WBC 14.4* 10.2  HGB 9.0* 8.1*  HCT 27.9* 24.6*  MCV 86.6 86.0  PLT 322 272   Cardiac Enzymes: No results found for this basename: CKTOTAL, CKMB, CKMBINDEX, TROPONINI,  in the last 168 hours BNP (last 3 results)  Recent Labs  10/26/11 1440  PROBNP 2287.0*   CBG:  Recent Labs Lab 09/15/12 2336 09/16/12 0607 09/16/12 1142 09/16/12 1629 09/17/12 0155  GLUCAP 156* 162* 161* 188* 330*    Recent Results (from the past 240 hour(s))  URINE CULTURE     Status: None   Collection Time    09/10/12 11:39 AM      Result Value Range Status   Specimen Description URINE, CATHETERIZED   Final   Special Requests NONE   Final   Culture  Setup Time 09/10/2012 17:34   Final   Colony Count >=100,000 COLONIES/ML   Final   Culture     Final   Value: METHICILLIN RESISTANT STAPHYLOCOCCUS  AUREUS     Note: RIFAMPIN AND GENTAMICIN SHOULD NOT BE USED AS SINGLE DRUGS FOR TREATMENT OF STAPH INFECTIONS. CRITICAL RESULT CALLED TO, READ BACK BY AND VERIFIED WITH: TY H@1156  ON [id number] BY [facility]   Report Status 09/12/2012 FINAL   Final   Organism ID, Bacteria METHICILLIN RESISTANT STAPHYLOCOCCUS AUREUS   Final  MRSA PCR SCREENING     Status: None   Collection Time    09/10/12  1:49 PM      Result Value Range Status   MRSA by PCR NEGATIVE  NEGATIVE Final   Comment:            The GeneXpert MRSA Assay (FDA     approved for NASAL specimens     only), is one component of a     comprehensive MRSA colonization     surveillance program. It is not     intended to diagnose MRSA     infection nor to guide or     monitor treatment for     MRSA infections.  CULTURE, BLOOD (ROUTINE X 2)     Status: None   Collection Time    09/13/12  9:45 AM      Result Value Range Status   Specimen Description BLOOD LEFT ARM   Final   Special Requests BOTTLES DRAWN AEROBIC AND ANAEROBIC 10CC   Final   Culture  Setup Time 09/13/2012 15:32   Final   Culture      Final   Value:        BLOOD CULTURE RECEIVED NO GROWTH TO DATE CULTURE WILL BE HELD FOR 5 DAYS BEFORE ISSUING A FINAL NEGATIVE REPORT   Report Status PENDING   Incomplete  CULTURE, BLOOD (ROUTINE X 2)     Status: None   Collection Time    09/13/12  9:55 AM      Result Value Range Status   Specimen Description BLOOD LEFT HAND   Final   Special Requests BOTTLES DRAWN AEROBIC ONLY 10CC   Final   Culture  Setup Time 09/13/2012 15:32   Final   Culture     Final   Value:        BLOOD CULTURE RECEIVED NO GROWTH TO DATE CULTURE WILL BE HELD FOR 5 DAYS BEFORE ISSUING A FINAL NEGATIVE REPORT   Report Status PENDING   Incomplete  CLOSTRIDIUM DIFFICILE BY PCR     Status: None   Collection Time    09/16/12 10:35 AM      Result Value Range Status   C difficile by pcr NEGATIVE  NEGATIVE Final  MRSA PCR SCREENING     Status: None   Collection Time    09/17/12  1:23 AM      Result Value Range Status   MRSA by PCR NEGATIVE  NEGATIVE Final   Comment:            The GeneXpert MRSA Assay (FDA     approved for NASAL specimens     only), is one component of a     comprehensive MRSA colonization     surveillance program. It is not     intended to diagnose MRSA     infection nor to guide or     monitor treatment for     MRSA infections.     Studies: Dg Chest Port 1 View  09/16/2012  *RADIOLOGY REPORT*  Clinical Data: Complaint of acute dyspnea.  PORTABLE CHEST - 1 VIEW  Comparison: 09/15/2012  Findings: Stable  appearance of cardiac pacemaker and right central venous catheters since previous study.  Shallow inspiration.  Heart size and pulmonary vascularity are normal for age for a effort. There is suggestion of atelectasis or infiltration in the left lung base.  No blunting of costophrenic angles.  No pneumothorax. Calcification of the aorta.  Degenerative changes in the right shoulder.  IMPRESSION: No significant change since previous study.  Shallow inspiration with atelectasis in the left lung base.    Original Report Authenticated By: Burman Nieves, M.D.     Scheduled Meds:   Continuous Infusions: . amiodarone (NEXTERONE PREMIX) 360 mg/200 mL dextrose 30 mg/hr (09/18/12 0500)  . morphine 1 mg/hr (09/18/12 0500)    Principal Problem:   Sepsis Active Problems:   Normocytic anemia   Dyslipidemia   Atrial fibrillation   ICD (implantable cardiac defibrillator) in place   Chronic systolic heart failure   Pressure ulcer stage IV   UTI (urinary tract infection)   Hyperkalemia   SBO (small bowel obstruction)   AKI (acute kidney injury)   MRSA infection   MRSA (methicillin resistant Staphylococcus aureus) septicemia   Bacteremia   ICD (implantable cardiac defibrillator) infection   Diabetes    Time spent: 35    [facility], JESSICA  [facility] Pager [phone]. If 7PM-7AM, please contact night-coverage at [email or web address], password [facility] 09/18/2012, 8:13 AM  LOS: 8 days

## 2012-09-18 NOTE — Progress Notes (Signed)
Patient and his wife requested that we do not come into the room every 2 hours to turn him.  Per wife, he will let us know when he wants to move.  She said she wants him to stay as comfortable as possible.   Steele Berg RN

## 2012-09-18 NOTE — Progress Notes (Signed)
Subjective:  His VT settled down with IV magnesium and amiodarone.  He is awake and alert and no c/o SOB.  Appears comfortable.  Objective:  Vital Signs in the last 24 hours: BP 85/48  Pulse 90  Temp(Src) 97.4 F (36.3 C) (Oral)  Resp 15  Ht 5\' 11"  (1.803 m)  Wt 102.4 kg (225 lb 12 oz)  BMI 31.5 kg/m2  SpO2 100%  Physical Exam: Elderly WM lying in bed in NAD Lungs:  Reduced BS Cardiac:  Rapid, regular rhythm, normal S1 and S2,distant heart sounds Abdomen:  Soft, nontender, no masses, obese Extremities: 1-2+edema present  Intake/Output from previous day: 04/05 0701 - 04/06 0700 In: -  Out: 500 [Urine:100; Stool:400] Weight Filed Weights   09/14/12 2017 09/15/12 2119 09/16/12 2012  Weight: 101.9 kg (224 lb 10.4 oz) 109.498 kg (241 lb 6.4 oz) 102.4 kg (225 lb 12 oz)    Lab Results: Basic Metabolic Panel:  Recent Labs  16/10/96 2330  NA 133*  K 3.1*  CL 102  CO2 22  GLUCOSE 348*  BUN 25*  CREATININE 0.57   BNP    Component Value Date/Time   PROBNP 2287.0* 10/26/2011 1440    PROTIME: Lab Results  Component Value Date   INR 1.65* 09/16/2012   INR 1.69* 09/15/2012   INR 1.62* 09/14/2012    Telemetry: Wide complex tachycardia rates 170 -260  Assessment/Plan: 1. Ischemic cardiomyopathy 2. A fib with RVR 3. Recurrent ventricular tachycardia settled down 4. Hypotension likely secondary to rate 5. Hypokalemia and hypomagnesemia  Rec:  I would stop the IV amiodarone.  He could go on po amiodarone at this point.  OK to go out of unit.    Darden Palmer  MD [facility] Cardiology  09/18/2012, 8:18 AM

## 2012-09-19 DIAGNOSIS — I509 Heart failure, unspecified: Secondary | ICD-10-CM

## 2012-09-19 DIAGNOSIS — I5022 Chronic systolic (congestive) heart failure: Secondary | ICD-10-CM

## 2012-09-19 LAB — CULTURE, BLOOD (ROUTINE X 2)
Culture: NO GROWTH
Culture: NO GROWTH

## 2012-09-19 MED ORDER — ONDANSETRON HCL 4 MG/2ML IJ SOLN
4.0000 mg | Freq: Four times a day (QID) | INTRAMUSCULAR | Status: DC
  Administered 2012-09-19 – 2012-09-22 (×12): 4 mg via INTRAVENOUS
  Filled 2012-09-19 (×11): qty 2

## 2012-09-19 NOTE — Progress Notes (Signed)
ANTIBIOTIC CONSULT NOTE  Pharmacy Consult for Vancomycin Indication: MRSA UTI  No Known Allergies  Patient Measurements: Height: 5\' 11"  (180.3 cm) Weight: 225 lb 12 oz (102.4 kg) (with air matress box/92.4kg without) IBW/kg (Calculated) : 75.3  Vital Signs: Temp: 97.4 F (36.3 C) (04/07 2036) Temp src: Oral (04/07 2036) BP: 93/50 mmHg (04/07 2036) Pulse Rate: 91 (04/07 2036) Intake/Output from previous day: 04/06 0701 - 04/07 0700 In: 9 [I.V.:9] Out: 650 [Urine:650] Intake/Output from this shift:    Labs: Last SCr 0.57 4/4  No results found for this basename: WBC, HGB, PLT, LABCREA, CREATININE,  in the last 72 hours Estimated Creatinine Clearance: 92.7 ml/min (by C-G formula based on Cr of 0.57). No results found for this basename: VANCOTROUGH, Leodis Binet, VANCORANDOM, GENTTROUGH, GENTPEAK, GENTRANDOM, TOBRATROUGH, TOBRAPEAK, TOBRARND, AMIKACINPEAK, AMIKACINTROU, AMIKACIN,  in the last 72 hours   Microbiology: Recent Results (from the past 720 hour(s))  URINE CULTURE     Status: None   Collection Time    09/10/12 11:39 AM      Result Value Range Status   Specimen Description URINE, CATHETERIZED   Final   Special Requests NONE   Final   Culture  Setup Time 09/10/2012 17:34   Final   Colony Count >=100,000 COLONIES/ML   Final   Culture     Final   Value: METHICILLIN RESISTANT STAPHYLOCOCCUS AUREUS     Note: RIFAMPIN AND GENTAMICIN SHOULD NOT BE USED AS SINGLE DRUGS FOR TREATMENT OF STAPH INFECTIONS. CRITICAL RESULT CALLED TO, READ BACK BY AND VERIFIED WITH: TY H@1156  ON [id number] BY [facility]   Report Status 09/12/2012 FINAL   Final   Organism ID, Bacteria METHICILLIN RESISTANT STAPHYLOCOCCUS AUREUS   Final  MRSA PCR SCREENING     Status: None   Collection Time    09/10/12  1:49 PM      Result Value Range Status   MRSA by PCR NEGATIVE  NEGATIVE Final   Comment:            The GeneXpert MRSA Assay (FDA     approved for NASAL specimens     only), is one component of a   comprehensive MRSA colonization     surveillance program. It is not     intended to diagnose MRSA     infection nor to guide or     monitor treatment for     MRSA infections.  CULTURE, BLOOD (ROUTINE X 2)     Status: None   Collection Time    09/13/12  9:45 AM      Result Value Range Status   Specimen Description BLOOD LEFT ARM   Final   Special Requests BOTTLES DRAWN AEROBIC AND ANAEROBIC 10CC   Final   Culture  Setup Time 09/13/2012 15:32   Final   Culture NO GROWTH 5 DAYS   Final   Report Status 09/19/2012 FINAL   Final  CULTURE, BLOOD (ROUTINE X 2)     Status: None   Collection Time    09/13/12  9:55 AM      Result Value Range Status   Specimen Description BLOOD LEFT HAND   Final   Special Requests BOTTLES DRAWN AEROBIC ONLY 10CC   Final   Culture  Setup Time 09/13/2012 15:32   Final   Culture NO GROWTH 5 DAYS   Final   Report Status 09/19/2012 FINAL   Final  CLOSTRIDIUM DIFFICILE BY PCR     Status: None   Collection Time  09/16/12 10:35 AM      Result Value Range Status   C difficile by pcr NEGATIVE  NEGATIVE Final  MRSA PCR SCREENING     Status: None   Collection Time    09/17/12  1:23 AM      Result Value Range Status   MRSA by PCR NEGATIVE  NEGATIVE Final   Comment:            The GeneXpert MRSA Assay (FDA     approved for NASAL specimens     only), is one component of a     comprehensive MRSA colonization     surveillance program. It is not     intended to diagnose MRSA     infection nor to guide or     monitor treatment for     MRSA infections.     Assessment: 79 y.o. Male with MRSA UTI for vancomycin   Goal of Therapy:  Vancomycin trough 10-15  Plan:  Vancomycin 1500 mg IV now, then 750 mg IV q12h Check BMet in am and adjust dose if needed  Geannie Risen, PharmD, BCPS  09/19/2012 11:58 PM

## 2012-09-19 NOTE — Progress Notes (Signed)
Patient ZO:XWRUEA HEVER CASTILLEJA      DOB: January 24, 1932      VWU:[id number]   Palliative Medicine Team at [facility] Progress Note    Subjective:  Visited with patient earlier this evening regarding his goals of care.  He had a hard weekend with the firing of the AICD and was not expected to live but is doing remarkably well .  He awoke yesterday and has been chatty and more upbeat ever since.  He reported to me that maybe the better pain control had something to do with it, but that he was concerned with becoming addicted and so has not been using pain medications.  He reported that he is having second thoughts regarding pursing full comfort.  He reported since he is doing well that he wanted to resume antibiotics,and try to get better.  His wife arrived shortly their after and I was dismissed.  They were going to update each other and she did not object to the change while I was with them.  I will contact her in the am as we were not able to talk later in the evening.  I will go ahead and resume the Vancomycin and try to wean the morphine down to 0.5 mg with the intent to switch to something oral in the am.  I will affirm all of this again with his spouse in the AM.   Filed Vitals:   09/19/12 2036  BP: 93/50  Pulse: 91  Temp: 97.4 F (36.3 C)  Resp: 16   Physical exam: Generally in much better spirits.  He admits to being very chatty when he is feeling well.  He appears to have full capacity for decision making at this time.  Chest decreased with distant breath sounds, no wheezing CVS: very distant , sounds regular, but hard to hear Ext with multiple areas of ecchymosis Neuro: awake alert, talking nearly non- stop     Assessment and plan:80 yr old white male with history of Ischemic cardiomyopathy admitted with sepsis secondary to MRSA uti, felt to perhaps have bacteremia.  Course was complicated by firing of AICD with exacerbation of CHF treated for comfort per family request.  Patient looked  near death but made a remarkable recovery.  This evening he informed me that he would like to resume treatment of wound and get back to [facility] .  He is hopeful to try to make some rehabiliation.  I did not get to talk directly to his wife about the topic as he interupted the visit and dismissed me but I was able to tell her his wishes which he did not object to.  I will ,therefore resume Vanc until I can speak with his wife, and try to wean the morpine drip some tonight and them further tomorrow and start oral opiates.  The patient was agreeable to this.   1.  DNR/ DNI  2.  Resume Vancomycin until we talk with his wife in the am.  3.  Pain and dyspnea very well controlled he is wide awake on 1 mg/hr of morphine.  Will try to wean down to 0.5 mg /hr over night and then consider changing to off with oral medication in its place.  4.  He relates appetite was better. Continue supplement  Discussed with Dr. Benjamine Mola  Total time 35 min   Mariea Mcmartin L. Ladona Ridgel, MD MBA The Palliative Medicine Team at [facility] Phone: [phone] Pager: [phone]

## 2012-09-19 NOTE — Clinical Social Work Note (Signed)
CSW talked with Dr. Ladona Ridgel regarding patient disposition. Patient placed on comfort care and initially the plan was comfort care with the possibility of hospice placement. However CSW contact later by Dr. Ladona Ridgel that patient wants to return to SNF. Morphine IV may be changed to PO meds. CSW will follow-up with attending MD and [facility] on 4/8 regarding discharge plans.  Genelle Bal, MSW, LCSW [phone]

## 2012-09-19 NOTE — Progress Notes (Addendum)
Subjective:  His VT settled down with IV magnesium and amiodarone.  He has no c/o SOB.  Defibrillator was turned off over the weekend.  No longer on telemetry  Objective:  Vital Signs in the last 24 hours: BP 96/50  Pulse 93  Temp(Src) 98.7 F (37.1 C) (Axillary)  Resp 15  Ht 5\' 11"  (1.803 m)  Wt 102.4 kg (225 lb 12 oz)  BMI 31.5 kg/m2  SpO2 95%  Physical Exam: Elderly WM lying in bed in NAD Lungs:  Reduced BS Cardiac: Controlled rate, normal S1 and S2,distant heart sounds Abdomen:  Soft, nontender, no masses, obese Extremities: 1-2+edema present  Intake/Output from previous day: 04/06 0701 - 04/07 0700 In: 9 [I.V.:9] Out: 650 [Urine:650] Weight Filed Weights   09/14/12 2017 09/15/12 2119 09/16/12 2012  Weight: 101.9 kg (224 lb 10.4 oz) 109.498 kg (241 lb 6.4 oz) 102.4 kg (225 lb 12 oz)    Lab Results: Basic Metabolic Panel:  Recent Labs  40/98/11 2330  NA 133*  K 3.1*  CL 102  CO2 22  GLUCOSE 348*  BUN 25*  CREATININE 0.57   BNP    Component Value Date/Time   PROBNP 2287.0* 10/26/2011 1440    PROTIME: Lab Results  Component Value Date   INR 1.65* 09/16/2012   INR 1.69* 09/15/2012   INR 1.62* 09/14/2012    Telemetry:   Assessment/Plan: 1. Ischemic cardiomyopathy 2. A fib with RVR 3. Recurrent ventricular tachycardia settled down 4. Hypotension likely secondary to rate 5. Hypokalemia and hypomagnesemia  Rec:  Switched to po amiodarone at this point.  Comfort care.  Morphine drip in place.  He has no symptoms of either shortness of breath or palpitations.  He will let us nurse notified her of the symptoms arise.  We did discuss the rationale behind turning off the defibrillator function of his device.  He is in agreement as the defibrillator firing was very uncomfortable for him.      Corky Crafts,  MD [facility] Cardiology  09/19/2012, 9:22 AM

## 2012-09-19 NOTE — Progress Notes (Signed)
Nutrition Brief Note  Chart reviewed. Pt now transitioning to comfort care.  No further nutrition interventions warranted at this time.  Please re-consult as needed.   Layci Stenglein MS, RD, LDN Pager: [phone] After-hours pager: [phone]    

## 2012-09-19 NOTE — Progress Notes (Signed)
[facility] PROGRESS NOTE  TOMOTHY EDDINS ZOX:[id number] DOB: Apr 01, 1932 DOA: 09/10/2012 PCP: Daisy Floro, MD  Assessment/Plan: Patient's AICD went off several times.  He was moved to the ICU where he was made comfort care  at the wishes of his wife and the patient.  Morphine drip  started at 1 mg /hr and 1 mg q 30 min PRN.  comfortable appearing.  Po amiodarone added for comfort.  Patient was transferred to floor for continued end of life care   -added ATC zofran for nausea  Code Status: DNR Family Communication: patient Disposition Plan: comfort care-   Consultants:  EP  Cards  palliative  Procedures:  TEE 4/2  Antibiotics:  vanc  HPI/Subjective: C/o nausea     Objective: Filed Vitals:   09/18/12 1200 09/18/12 1300 09/18/12 1400 09/18/12 1622  BP:    96/50  Pulse: 92 97 87 93  Temp:    98.7 F (37.1 C)  TempSrc:    Axillary  Resp: 11 20 13 15   Height:      Weight:      SpO2: 100% 100% 100% 95%    Intake/Output Summary (Last 24 hours) at 09/19/12 0849 Last data filed at 09/19/12 0531  Gross per 24 hour  Intake      9 ml  Output    650 ml  Net   -641 ml   Filed Weights   09/14/12 2017 09/15/12 2119 09/16/12 2012  Weight: 101.9 kg (224 lb 10.4 oz) 109.498 kg (241 lb 6.4 oz) 102.4 kg (225 lb 12 oz)    Exam:   General:  Pale appearing, NAD   Data Reviewed: Basic Metabolic Panel:  Recent Labs Lab 09/13/12 0455 09/15/12 0547 09/16/12 2330 09/17/12 0117  NA 141 141 133*  --   K 4.5 4.0 3.1*  --   CL 109 111 102  --   CO2 24 26 22   --   GLUCOSE 130* 128* 348*  --   BUN 37* 23 25*  --   CREATININE 0.88 0.66 0.57  --   CALCIUM 8.3* 7.8* 7.7*  --   MG  --   --   --  1.5   Liver Function Tests: No results found for this basename: AST, ALT, ALKPHOS, BILITOT, PROT, ALBUMIN,  in the last 168 hours No results found for this basename: LIPASE, AMYLASE,  in the last 168 hours No results found for this basename: AMMONIA,  in the last  168 hours CBC:  Recent Labs Lab 09/13/12 0455 09/15/12 0547  WBC 14.4* 10.2  HGB 9.0* 8.1*  HCT 27.9* 24.6*  MCV 86.6 86.0  PLT 322 272   Cardiac Enzymes: No results found for this basename: CKTOTAL, CKMB, CKMBINDEX, TROPONINI,  in the last 168 hours BNP (last 3 results)  Recent Labs  10/26/11 1440  PROBNP 2287.0*   CBG:  Recent Labs Lab 09/15/12 2336 09/16/12 0607 09/16/12 1142 09/16/12 1629 09/17/12 0155  GLUCAP 156* 162* 161* 188* 330*    Recent Results (from the past 240 hour(s))  URINE CULTURE     Status: None   Collection Time    09/10/12 11:39 AM      Result Value Range Status   Specimen Description URINE, CATHETERIZED   Final   Special Requests NONE   Final   Culture  Setup Time 09/10/2012 17:34   Final   Colony Count >=100,000 COLONIES/ML   Final   Culture     Final   Value: METHICILLIN RESISTANT  STAPHYLOCOCCUS AUREUS     Note: RIFAMPIN AND GENTAMICIN SHOULD NOT BE USED AS SINGLE DRUGS FOR TREATMENT OF STAPH INFECTIONS. CRITICAL RESULT CALLED TO, READ BACK BY AND VERIFIED WITH: TY H@1156  ON [id number] BY [facility]   Report Status 09/12/2012 FINAL   Final   Organism ID, Bacteria METHICILLIN RESISTANT STAPHYLOCOCCUS AUREUS   Final  MRSA PCR SCREENING     Status: None   Collection Time    09/10/12  1:49 PM      Result Value Range Status   MRSA by PCR NEGATIVE  NEGATIVE Final   Comment:            The GeneXpert MRSA Assay (FDA     approved for NASAL specimens     only), is one component of a     comprehensive MRSA colonization     surveillance program. It is not     intended to diagnose MRSA     infection nor to guide or     monitor treatment for     MRSA infections.  CULTURE, BLOOD (ROUTINE X 2)     Status: None   Collection Time    09/13/12  9:45 AM      Result Value Range Status   Specimen Description BLOOD LEFT ARM   Final   Special Requests BOTTLES DRAWN AEROBIC AND ANAEROBIC 10CC   Final   Culture  Setup Time 09/13/2012 15:32   Final   Culture      Final   Value:        BLOOD CULTURE RECEIVED NO GROWTH TO DATE CULTURE WILL BE HELD FOR 5 DAYS BEFORE ISSUING A FINAL NEGATIVE REPORT   Report Status PENDING   Incomplete  CULTURE, BLOOD (ROUTINE X 2)     Status: None   Collection Time    09/13/12  9:55 AM      Result Value Range Status   Specimen Description BLOOD LEFT HAND   Final   Special Requests BOTTLES DRAWN AEROBIC ONLY 10CC   Final   Culture  Setup Time 09/13/2012 15:32   Final   Culture     Final   Value:        BLOOD CULTURE RECEIVED NO GROWTH TO DATE CULTURE WILL BE HELD FOR 5 DAYS BEFORE ISSUING A FINAL NEGATIVE REPORT   Report Status PENDING   Incomplete  CLOSTRIDIUM DIFFICILE BY PCR     Status: None   Collection Time    09/16/12 10:35 AM      Result Value Range Status   C difficile by pcr NEGATIVE  NEGATIVE Final  MRSA PCR SCREENING     Status: None   Collection Time    09/17/12  1:23 AM      Result Value Range Status   MRSA by PCR NEGATIVE  NEGATIVE Final   Comment:            The GeneXpert MRSA Assay (FDA     approved for NASAL specimens     only), is one component of a     comprehensive MRSA colonization     surveillance program. It is not     intended to diagnose MRSA     infection nor to guide or     monitor treatment for     MRSA infections.     Studies: No results found.  Scheduled Meds: . amiodarone  200 mg Oral BID  . ondansetron (ZOFRAN) IV  4 mg Intravenous Q6H   Continuous Infusions: .  morphine 1 mg/hr (09/18/12 2000)    Principal Problem:   Sepsis Active Problems:   Normocytic anemia   Dyslipidemia   Atrial fibrillation   ICD (implantable cardiac defibrillator) in place   Chronic systolic heart failure   Pressure ulcer stage IV   UTI (urinary tract infection)   Hyperkalemia   SBO (small bowel obstruction)   AKI (acute kidney injury)   MRSA infection   MRSA (methicillin resistant Staphylococcus aureus) septicemia   Bacteremia   ICD (implantable cardiac defibrillator)  infection   Diabetes    Time spent: 25    Marlin Canary  [facility] Pager [phone]. If 7PM-7AM, please contact night-coverage at [email or web address], password [facility] 09/19/2012, 8:49 AM  LOS: 9 days

## 2012-09-19 NOTE — Consult Note (Signed)
WOC follow up  Wound type:Stage IV pressure ulcer Pressure Ulcer POA: Yes dressing procedure/placement/frequency: VAC dressing ordered for T/TH/Sat dressing changes, however upon my follow up today, my dressing in place from 09/15/12, Supplies at bedside and pt wishes to coordinate timing of VAC dressing change with his primary RN today.  Discussed with primary RN, and explained use of hydrocolloid along distal edge of wound for aid seal and that pt prefers drape be removed with adhesive remover (which is also at the bedside).  Bedside nurse to change VAC today.   WOC will follow along with you for wound progression. Laury Huizar Sinclair RN,CWOCN [phone]

## 2012-09-20 LAB — GLUCOSE, CAPILLARY
Glucose-Capillary: 142 mg/dL — ABNORMAL HIGH (ref 70–99)
Glucose-Capillary: 116 mg/dL — ABNORMAL HIGH (ref 70–99)
Glucose-Capillary: 129 mg/dL — ABNORMAL HIGH (ref 70–99)

## 2012-09-20 LAB — BASIC METABOLIC PANEL
CO2: 22 mEq/L (ref 19–32)
Calcium: 8.5 mg/dL (ref 8.4–10.5)
Chloride: 107 mEq/L (ref 96–112)
Creatinine, Ser: 1.5 mg/dL — ABNORMAL HIGH (ref 0.50–1.35)
Glucose, Bld: 117 mg/dL — ABNORMAL HIGH (ref 70–99)

## 2012-09-20 LAB — CBC
HCT: 29.9 % — ABNORMAL LOW (ref 39.0–52.0)
MCH: 28.2 pg (ref 26.0–34.0)
MCV: 85.2 fL (ref 78.0–100.0)
RDW: 16.7 % — ABNORMAL HIGH (ref 11.5–15.5)
WBC: 9.5 10*3/uL (ref 4.0–10.5)

## 2012-09-20 MED ORDER — WARFARIN SODIUM 4 MG PO TABS
4.0000 mg | ORAL_TABLET | Freq: Once | ORAL | Status: AC
  Administered 2012-09-20: 4 mg via ORAL
  Filled 2012-09-20: qty 1

## 2012-09-20 MED ORDER — SIMVASTATIN 20 MG PO TABS
20.0000 mg | ORAL_TABLET | Freq: Every evening | ORAL | Status: DC
  Administered 2012-09-20 – 2012-09-21 (×2): 20 mg via ORAL
  Filled 2012-09-20 (×3): qty 1

## 2012-09-20 MED ORDER — INSULIN ASPART 100 UNIT/ML ~~LOC~~ SOLN
0.0000 [IU] | Freq: Three times a day (TID) | SUBCUTANEOUS | Status: DC
  Administered 2012-09-20: 1 [IU] via SUBCUTANEOUS
  Administered 2012-09-21: 3 [IU] via SUBCUTANEOUS
  Administered 2012-09-22: 2 [IU] via SUBCUTANEOUS

## 2012-09-20 MED ORDER — INSULIN ASPART 100 UNIT/ML ~~LOC~~ SOLN: 0.0000 [IU] | Freq: Every day | SUBCUTANEOUS | Status: DC

## 2012-09-20 MED ORDER — TAMSULOSIN HCL 0.4 MG PO CAPS
0.4000 mg | ORAL_CAPSULE | Freq: Every day | ORAL | Status: DC
  Administered 2012-09-20 – 2012-09-22 (×3): 0.4 mg via ORAL
  Filled 2012-09-20 (×3): qty 1

## 2012-09-20 MED ORDER — VANCOMYCIN HCL 10 G IV SOLR
1500.0000 mg | Freq: Once | INTRAVENOUS | Status: AC
  Administered 2012-09-20: 1500 mg via INTRAVENOUS
  Filled 2012-09-20: qty 1500

## 2012-09-20 MED ORDER — FLEET ENEMA 7-19 GM/118ML RE ENEM
1.0000 | ENEMA | RECTAL | Status: DC | PRN
  Filled 2012-09-20: qty 1

## 2012-09-20 MED ORDER — DULOXETINE HCL 30 MG PO CPEP
30.0000 mg | ORAL_CAPSULE | Freq: Every day | ORAL | Status: DC
  Administered 2012-09-20 – 2012-09-22 (×3): 30 mg via ORAL
  Filled 2012-09-20 (×3): qty 1

## 2012-09-20 MED ORDER — VANCOMYCIN HCL IN DEXTROSE 750-5 MG/150ML-% IV SOLN
750.0000 mg | Freq: Two times a day (BID) | INTRAVENOUS | Status: DC
  Administered 2012-09-20 – 2012-09-21 (×3): 750 mg via INTRAVENOUS
  Filled 2012-09-20 (×4): qty 150

## 2012-09-20 MED ORDER — WARFARIN - PHARMACIST DOSING INPATIENT
Freq: Every day | Status: DC
  Administered 2012-09-20 – 2012-09-21 (×2)

## 2012-09-20 MED ORDER — FERROUS SULFATE 325 (65 FE) MG PO TABS
325.0000 mg | ORAL_TABLET | Freq: Every day | ORAL | Status: DC
  Administered 2012-09-20 – 2012-09-22 (×3): 325 mg via ORAL
  Filled 2012-09-20 (×4): qty 1

## 2012-09-20 NOTE — Care Management Note (Signed)
   CARE MANAGEMENT NOTE 09/20/2012  Patient:  Troy Cook, Troy Cook   Account Number:  [id number]  Date Initiated:  09/12/2012  Documentation initiated by:  Darlyne Russian  Subjective/Objective Assessment:   Patient admitted from SNF with sepsis, UTI.  PMH: recent treatment for +MRSA /UTI  Admitted from [facility]     Action/Plan:   Progression of care and discharge planning   Anticipated DC Date:     Anticipated DC Plan:           Choice offered to / List presented to:             Status of service:   Medicare Important Message given?   (If response is "NO", the following Medicare IM given date fields will be blank) Date Medicare IM given:   Date Additional Medicare IM given:    Discharge Disposition:    Per UR Regulation:    If discussed at Long Length of Stay Meetings, dates discussed:    Comments:  09/20/12 Pt now more alert and wishes to continue plan of care , pt with VAC, IV antibiotics and flexiseal continues. Referral made to [facility] for possible LTAC admission per request of Dr Ladona Ridgel. Tinnie Gens, [facility] coordinator was called and she has agreed to review this pt for possible admission. Johny Shock RN MPH, case manager, [phone]   09/14/2012  0900  Darlyne Russian RN, CCM  [phone]  stage IV decubitus treated with hydrotherapy

## 2012-09-20 NOTE — Progress Notes (Addendum)
[facility] PROGRESS NOTE  Troy Cook WGN:[id number] DOB: 08-May-1932 DOA: 09/10/2012 PCP: Daisy Floro, MD  Complicated man from [facility]- came in septic.  Was treating his presumed MRSA bacteremia with 6 weeks of vanc, when his AICD fired several times on the night of 4/4- he then decided he wanted his AICD turned off and to become comfort care.  He did appear to be near death but has since recovered and expressed that he would like to be treated for the infection and he would consider seeing plastic surgery as an outpatient.   Assessment/Plan: Severe Sepsis - multifactorial - MRSA UTI, - patient most likely has staph aureus bacteremia which will explain why he had persistent mrsa utis at SNF priro to admission to [place]. Started empiric iv vancomycin and Zosyn on admission . Zosyn stopped 3/31 after culture results back. Continue Vancomycin iv . ID consultation:   -TEE negative --given ICD presence and likely occult bacteremia,  treat for 6 weeks with IV vancomycin  -PICC Line He should have weekly CBC BMP and vancomycin troughs faxed to Dr. Algis Liming at [phone].  --then stop abx and repeat surveillance cultures 2 weeks after last dose of abx - outpatient referral to plastic surgeon- family states they do not want to see a plastic surgeon until his infection in his blood is cleared   SBO - probable distal obstruction - ? From constipation vs adhesions from prior surgery. Had NG tube, suctioning, bowel rest from 3/29-3/30 with minimal improvement and minimal fluid suctioned . NG tube removed 09/11/12. patient is too ill for surgical intervention. Constipation resolved by 3/31 and SBO resolved- now with some diarrhea- flexiseal in, c diff negative  AKI - multifactorial - suspect from direct infectious injury to the tubules after persistent staph aureus infection , combined with dehydration - iv fluids and closely monitor status - improved by 09/11/12. Iv fluids stopped 09/12/12. Good  UOP  Stage IV decub ulcer - air mattress overlay. wound care consult obtained on 3/30 and patient started hydrotherapy 3/31, wound vac placed 4/3, ? outpatient referral to plastic surgery   Cardiomyopathy s/p AICD - last EF 35%. Medical management- AICD turned off- amiodarone for comfort  Hyperkalemia secondary to renal failure - resolved with D50 and insulin and hydration   Hyponatremia from dehydration - resolved   DM 2 - SSI   Afib with RVR - decompensated from sepsis - started low dose coreg but has been held secondary to dec BP.  - as was comfort care- resume coumadin with no bridge, amiodarone started for AICD firing- AICD turned off   Leucocytosis: improved but check labs today  Pain- morphine gtt as comfort care, wean down and place on PRNs   Code Status: DNR Family Communication: patient Disposition Plan: SNF with PICC/abx/wound vac   Consultants:  EP  Cards  Palliative  ID   Procedures:  TEE 4/2  Antibiotics:  vanc  HPI/Subjective: Very talkative today -expressed his general dislike of physicians - did say [facility] long [facility] before returning to his "hellhole" -his goal is to become mobile enough to go to church and live at home with his wife  Objective: Filed Vitals:   09/18/12 1622 09/19/12 1003 09/19/12 2036 09/20/12 0539  BP: 96/50 97/55 93/50  124/79  Pulse: 93 65 91 70  Temp: 98.7 F (37.1 C) 97.7 F (36.5 C) 97.4 F (36.3 C) 98.9 F (37.2 C)  TempSrc: Axillary  Oral Oral  Resp:  15 16 16 19   Height:      Weight:      SpO2: 95% 93% 100% 100%    Intake/Output Summary (Last 24 hours) at 09/20/12 0914 Last data filed at 09/20/12 0540  Gross per 24 hour  Intake    120 ml  Output    800 ml  Net   -680 ml   Filed Weights   09/14/12 2017 09/15/12 2119 09/16/12 2012  Weight: 101.9 kg (224 lb 10.4 oz) 109.498 kg (241 lb 6.4 oz) 102.4 kg (225 lb 12 oz)    Exam:   General:  Awake- very talkative  today  Cardiovascular: irr  Respiratory:clear, no wheezing  Abdomen: +BS, soft, NT  Musculoskeletal: moves all 4 ext - very weak  Data Reviewed: Basic Metabolic Panel:  Recent Labs Lab 09/15/12 0547 09/16/12 2330 09/17/12 0117  NA 141 133*  --   K 4.0 3.1*  --   CL 111 102  --   CO2 26 22  --   GLUCOSE 128* 348*  --   BUN 23 25*  --   CREATININE 0.66 0.57  --   CALCIUM 7.8* 7.7*  --   MG  --   --  1.5   Liver Function Tests: No results found for this basename: AST, ALT, ALKPHOS, BILITOT, PROT, ALBUMIN,  in the last 168 hours No results found for this basename: LIPASE, AMYLASE,  in the last 168 hours No results found for this basename: AMMONIA,  in the last 168 hours CBC:  Recent Labs Lab 09/15/12 0547  WBC 10.2  HGB 8.1*  HCT 24.6*  MCV 86.0  PLT 272   Cardiac Enzymes: No results found for this basename: CKTOTAL, CKMB, CKMBINDEX, TROPONINI,  in the last 168 hours BNP (last 3 results)  Recent Labs  10/26/11 1440  PROBNP 2287.0*   CBG:  Recent Labs Lab 09/15/12 2336 09/16/12 0607 09/16/12 1142 09/16/12 1629 09/17/12 0155  GLUCAP 156* 162* 161* 188* 330*    Recent Results (from the past 240 hour(s))  URINE CULTURE     Status: None   Collection Time    09/10/12 11:39 AM      Result Value Range Status   Specimen Description URINE, CATHETERIZED   Final   Special Requests NONE   Final   Culture  Setup Time 09/10/2012 17:34   Final   Colony Count >=100,000 COLONIES/ML   Final   Culture     Final   Value: METHICILLIN RESISTANT STAPHYLOCOCCUS AUREUS     Note: RIFAMPIN AND GENTAMICIN SHOULD NOT BE USED AS SINGLE DRUGS FOR TREATMENT OF STAPH INFECTIONS. CRITICAL RESULT CALLED TO, READ BACK BY AND VERIFIED WITH: TY H@1156  ON [id number] BY [facility]   Report Status 09/12/2012 FINAL   Final   Organism ID, Bacteria METHICILLIN RESISTANT STAPHYLOCOCCUS AUREUS   Final  MRSA PCR SCREENING     Status: None   Collection Time    09/10/12  1:49 PM      Result Value  Range Status   MRSA by PCR NEGATIVE  NEGATIVE Final   Comment:            The GeneXpert MRSA Assay (FDA     approved for NASAL specimens     only), is one component of a     comprehensive MRSA colonization     surveillance program. It is not     intended to diagnose MRSA     infection nor to guide or  monitor treatment for     MRSA infections.  CULTURE, BLOOD (ROUTINE X 2)     Status: None   Collection Time    09/13/12  9:45 AM      Result Value Range Status   Specimen Description BLOOD LEFT ARM   Final   Special Requests BOTTLES DRAWN AEROBIC AND ANAEROBIC 10CC   Final   Culture  Setup Time 09/13/2012 15:32   Final   Culture NO GROWTH 5 DAYS   Final   Report Status 09/19/2012 FINAL   Final  CULTURE, BLOOD (ROUTINE X 2)     Status: None   Collection Time    09/13/12  9:55 AM      Result Value Range Status   Specimen Description BLOOD LEFT HAND   Final   Special Requests BOTTLES DRAWN AEROBIC ONLY 10CC   Final   Culture  Setup Time 09/13/2012 15:32   Final   Culture NO GROWTH 5 DAYS   Final   Report Status 09/19/2012 FINAL   Final  CLOSTRIDIUM DIFFICILE BY PCR     Status: None   Collection Time    09/16/12 10:35 AM      Result Value Range Status   C difficile by pcr NEGATIVE  NEGATIVE Final  MRSA PCR SCREENING     Status: None   Collection Time    09/17/12  1:23 AM      Result Value Range Status   MRSA by PCR NEGATIVE  NEGATIVE Final   Comment:            The GeneXpert MRSA Assay (FDA     approved for NASAL specimens     only), is one component of a     comprehensive MRSA colonization     surveillance program. It is not     intended to diagnose MRSA     infection nor to guide or     monitor treatment for     MRSA infections.     Studies: No results found.  Scheduled Meds: . amiodarone  200 mg Oral BID  . DULoxetine  30 mg Oral Daily  . [START ON 09/21/2012] ferrous sulfate  325 mg Oral Q breakfast  . ondansetron (ZOFRAN) IV  4 mg Intravenous Q6H  .  simvastatin  20 mg Oral QPM  . tamsulosin  0.4 mg Oral Daily  . vancomycin  750 mg Intravenous Q12H   Continuous Infusions: . morphine 0.5 mg/hr (09/19/12 2345)    Principal Problem:   Sepsis Active Problems:   Normocytic anemia   Dyslipidemia   Atrial fibrillation   ICD (implantable cardiac defibrillator) in place   Chronic systolic heart failure   Pressure ulcer stage IV   UTI (urinary tract infection)   Hyperkalemia   SBO (small bowel obstruction)   AKI (acute kidney injury)   MRSA infection   MRSA (methicillin resistant Staphylococcus aureus) septicemia   Bacteremia   ICD (implantable cardiac defibrillator) infection   Diabetes    Time spent: 35    [facility], Binnie Vonderhaar  [facility] Pager [phone]. If 7PM-7AM, please contact night-coverage at [email or web address], password [facility] 09/20/2012, 9:14 AM  LOS: 10 days

## 2012-09-20 NOTE — Progress Notes (Signed)
Spoke to [employer] from [employer].  He turned off the defibrillator portion of the device over the weekend.  Pacer is still active with a backup rate of 40 bpm.

## 2012-09-20 NOTE — Progress Notes (Signed)
ANTICOAGULATION CONSULT NOTE - Follow Up Consult  Pharmacy Consult for Coumadin Indication: atrial fibrillation  No Known Allergies  Patient Measurements: Height: 5\' 11"  (180.3 cm) Weight: 225 lb 12 oz (102.4 kg) (with air matress box/92.4kg without) IBW/kg (Calculated) : 75.3  Vital Signs: Temp: 97.7 F (36.5 C) (04/08 0928) Temp src: Oral (04/08 0539) BP: 98/67 mmHg (04/08 0928) Pulse Rate: 74 (04/08 0928)  Labs:  Recent Labs  09/20/12 1002  HGB 9.9*  HCT 29.9*  PLT 304  LABPROT 23.0*  INR 2.14*    Estimated Creatinine Clearance: 92.7 ml/min (by C-G formula based on Cr of 0.57).  Assessment: 80yom on coumadin pta (8mg  daily) for hx afib. Coumadin was subsequently stopped as patient was to be comfort care. Last coumadin dose 10mg  on 4/4, last INR 1.65 on 4/4.  Patient has now improved and coumadin is to be resumed. INR today is actually therapeutic at 2.14 - likely due to the addition of amiodarone on 4/6 (this is new for him).  Will likely require less coumadin now that amiodarone on board.  CBC is stable.  Goal of Therapy:  INR 2-3 Monitor platelets by anticoagulation protocol: Yes   Plan:  1) Coumadin 4mg  x 1 tonight 2) Daily INR  Fredrik Rigger 09/20/2012,11:02 AM

## 2012-09-20 NOTE — Progress Notes (Signed)
Patient WU:[id number] ELAND LAMANTIA      DOB: Aug 03, 1931      YNW:[id number]   Palliative Medicine Team at [facility] Progress Note    Subjective:  Patient again is very awake and alert . Continues to express his goals to resume treatment and consider rehab.  Patient would information from his cardiologist on what forms of RFI can interfere with is pacemaker as he had given up HAM radio operations because of his AICD.  He stated that he has no pain at this time  And he is not short of breath.  I reviewed his case with care management and Dr. Benjamine Mola.  He would be an excellent LTACH candidate.     Filed Vitals:   09/20/12 0928  BP: 98/67  Pulse: 74  Temp: 97.7 F (36.5 C)  Resp: 18   Physical exam:  Deferred full exam. Still with liquid stool in a flexiseal Hands are swollen  Lab Results  Component Value Date   CREATININE 1.50* 09/20/2012   BUN 49* 09/20/2012   NA 141 09/20/2012   K 4.2 09/20/2012   CL 107 09/20/2012   CO2 22 09/20/2012        Assessment and plan: 79 yr old with MRSA UTI, and possible MRSA bacteremia.  Course was complicated by VT with AICD firing.  AICD no deactivated.  Patient survived weekend and desires to purse further treatment   1.  DNR/ DNI  2.  Will ask Dr. Jim Like to reactivate optimal PM settings. Keeping AICD deactivated  3.  Vancomycin resumed last PM  4.  Will need to decide how long Flexiseal should stay in place   Time 35 min 1130 -1205 pm  Elaine Middleton L. Ladona Ridgel, MD MBA The Palliative Medicine Team at [facility] Phone: [phone] Pager: [phone]

## 2012-09-20 NOTE — Progress Notes (Signed)
Utilization review completed.  

## 2012-09-21 LAB — BASIC METABOLIC PANEL
BUN: 43 mg/dL — ABNORMAL HIGH (ref 6–23)
Chloride: 106 mEq/L (ref 96–112)
Creatinine, Ser: 1.42 mg/dL — ABNORMAL HIGH (ref 0.50–1.35)
Glucose, Bld: 126 mg/dL — ABNORMAL HIGH (ref 70–99)
Potassium: 3.7 mEq/L (ref 3.5–5.1)

## 2012-09-21 LAB — CBC
HCT: 26.5 % — ABNORMAL LOW (ref 39.0–52.0)
Hemoglobin: 8.8 g/dL — ABNORMAL LOW (ref 13.0–17.0)
MCHC: 33.2 g/dL (ref 30.0–36.0)
MCV: 84.7 fL (ref 78.0–100.0)
WBC: 8 10*3/uL (ref 4.0–10.5)

## 2012-09-21 LAB — VANCOMYCIN, TROUGH: Vancomycin Tr: 40.7 ug/mL (ref 10.0–20.0)

## 2012-09-21 LAB — GLUCOSE, CAPILLARY: Glucose-Capillary: 106 mg/dL — ABNORMAL HIGH (ref 70–99)

## 2012-09-21 MED ORDER — LOPERAMIDE HCL 2 MG PO CAPS
2.0000 mg | ORAL_CAPSULE | Freq: Three times a day (TID) | ORAL | Status: DC
  Administered 2012-09-21 – 2012-09-22 (×5): 2 mg via ORAL
  Filled 2012-09-21 (×7): qty 1

## 2012-09-21 MED ORDER — WARFARIN SODIUM 4 MG PO TABS
4.0000 mg | ORAL_TABLET | Freq: Once | ORAL | Status: AC
  Administered 2012-09-21: 4 mg via ORAL
  Filled 2012-09-21: qty 1

## 2012-09-21 MED ORDER — MORPHINE SULFATE (CONCENTRATE) 10 MG /0.5 ML PO SOLN: 5.0000 mg | ORAL | Status: DC | PRN

## 2012-09-21 MED ORDER — MORPHINE SULFATE (CONCENTRATE) 10 MG /0.5 ML PO SOLN
5.0000 mg | ORAL | Status: DC | PRN
  Administered 2012-09-21 – 2012-09-22 (×2): 5 mg via ORAL
  Filled 2012-09-21 (×2): qty 0.5

## 2012-09-21 NOTE — Clinical Social Work Note (Signed)
CSW spoke earlier today with Troy Cook, SW at [facility] regarding patient progress. CSW informed that patient's wife came to ED today. Ms. Troy Cook advised that CSW will talk with patient regarding returning to [facility] SNF as he has expressed displeasure with the care he has received there.  CSW introduced self and stated purpose of visit. [name] was visibly annoyed with CSW's visit but allowed me to talk with him regarding discharge plans. When asked about returning to [facility] at discharge pt. Indicated he would only return there if they can provide the IV therapy he needs. Patient assured that [facility] staff have been kept advised of his progress and needs at discharge. [name] added that he does not like to discuss anything without his wife present and reported that she will be at hospital on Thursday. CSW will follow-up with patient and wife on Thursday, 4/10.  Troy Cook, MSW, LCSW [phone]

## 2012-09-21 NOTE — Progress Notes (Signed)
Patient ZO:[id number] SOHAIL CAPRARO      DOB: 1932-04-23      VWU:[id number]   Following up on plans discussed yesterday with patient.  Morphine drip had been weaned to its lowed dose yesterday.  Patient refusing scheduled pain meds.  Will further wean drip to off today and at least leave prn oral medication.  Risk for withdraw is present but small please call if he develops any confusion,worsening shortness of breath or worsening pain.  Dr. Sharl Ma has been updated but will not be seeing today as the patient as the patient has low tolerance for repeated interaction.   Dr. Sharl Ma is aware of the weaning of the opiates and stands ready to assist if we see any adverse reaction.  Discussed with Dr. Lavera Guise and Dr. Rosalyn Charters L. Ladona Ridgel, MD MBA The Palliative Medicine Team at [facility] Phone: [phone] Pager: [phone]

## 2012-09-21 NOTE — Progress Notes (Signed)
ANTIBIOTIC CONSULT NOTE-PROGRESS NOTE  Pharmacy Consult for Vancomycin Indication: MRSA UTI  Hospital Problems Principal Problem:   Sepsis Active Problems:   Normocytic anemia   Dyslipidemia   Atrial fibrillation   ICD (implantable cardiac defibrillator) in place   Chronic systolic heart failure   Pressure ulcer stage IV   UTI (urinary tract infection)   Hyperkalemia   SBO (small bowel obstruction)   AKI (acute kidney injury)   MRSA infection   MRSA (methicillin resistant Staphylococcus aureus) septicemia   Bacteremia   ICD (implantable cardiac defibrillator) infection   Diabetes  Vitals: BP 96/56  Pulse 92  Temp(Src) 97.7 F (36.5 C) (Oral)  Resp 18  Ht 5\' 11"  (1.803 m)  Wt 206 lb 5.6 oz (93.6 kg)  BMI 28.79 kg/m2  SpO2 100%  Labs: Estimated Creatinine Clearance: 50.1 ml/min (by C-G formula based on Cr of 1.42).  Vancomycin trough level = 29 mcg/ml   Microbiology: Recent Results (from the past 720 hour(s))  URINE CULTURE     Status: None   Collection Time    09/10/12 11:39 AM      Result Value Range Status   Specimen Description URINE, CATHETERIZED   Final   Special Requests NONE   Final   Culture  Setup Time 09/10/2012 17:34   Final   Colony Count >=100,000 COLONIES/ML   Final   Culture     Final   Value: METHICILLIN RESISTANT STAPHYLOCOCCUS AUREUS     Note: RIFAMPIN AND GENTAMICIN SHOULD NOT BE USED AS SINGLE DRUGS FOR TREATMENT OF STAPH INFECTIONS. CRITICAL RESULT CALLED TO, READ BACK BY AND VERIFIED WITH: TY H@[id number]  ON [id number] BY [facility]   Report Status 09/12/2012 FINAL   Final   Organism ID, Bacteria METHICILLIN RESISTANT STAPHYLOCOCCUS AUREUS   Final  MRSA PCR SCREENING     Status: None   Collection Time    09/10/12  1:49 PM      Result Value Range Status   MRSA by PCR NEGATIVE  NEGATIVE Final   Comment:            The GeneXpert MRSA Assay (FDA     approved for NASAL specimens     only), is one component of a     comprehensive MRSA colonization   surveillance program. It is not     intended to diagnose MRSA     infection nor to guide or     monitor treatment for     MRSA infections.  CULTURE, BLOOD (ROUTINE X 2)     Status: None   Collection Time    09/13/12  9:45 AM      Result Value Range Status   Specimen Description BLOOD LEFT ARM   Final   Special Requests BOTTLES DRAWN AEROBIC AND ANAEROBIC 10CC   Final   Culture  Setup Time 09/13/2012 15:32   Final   Culture NO GROWTH 5 DAYS   Final   Report Status 09/19/2012 FINAL   Final  CULTURE, BLOOD (ROUTINE X 2)     Status: None   Collection Time    09/13/12  9:55 AM      Result Value Range Status   Specimen Description BLOOD LEFT HAND   Final   Special Requests BOTTLES DRAWN AEROBIC ONLY 10CC   Final   Culture  Setup Time 09/13/2012 15:32   Final   Culture NO GROWTH 5 DAYS   Final   Report Status 09/19/2012 FINAL   Final  CLOSTRIDIUM DIFFICILE BY PCR  Status: None   Collection Time    09/16/12 10:35 AM      Result Value Range Status   C difficile by pcr NEGATIVE  NEGATIVE Final  MRSA PCR SCREENING     Status: None   Collection Time    09/17/12  1:23 AM      Result Value Range Status   MRSA by PCR NEGATIVE  NEGATIVE Final    Anti-infectives Anti-infectives   Start     Dose/Rate Route Frequency Ordered Stop   09/20/12 1000  vancomycin (VANCOCIN) IVPB 750 mg/150 ml premix  Status:  Discontinued     750 mg 150 mL/hr over 60 Minutes Intravenous Every 12 hours 09/20/12 0004 09/21/12 2306   09/20/12 0030  vancomycin (VANCOCIN) 1,500 mg in sodium chloride 0.9 % 500 mL IVPB     1,500 mg 250 mL/hr over 120 Minutes Intravenous  Once 09/20/12 0004 09/20/12 0824   09/13/12 1000  vancomycin (VANCOCIN) IVPB 1000 mg/200 mL premix  Status:  Discontinued     1,000 mg 200 mL/hr over 60 Minutes Intravenous Every 12 hours 09/13/12 0829 09/17/12 0814   09/11/12 1400  piperacillin-tazobactam (ZOSYN) IVPB 3.375 g  Status:  Discontinued     3.375 g 12.5 mL/hr over 240 Minutes  Intravenous 3 times per day 09/11/12 1111 09/12/12 0831   09/11/12 1400  vancomycin (VANCOCIN) IVPB 1000 mg/200 mL premix  Status:  Discontinued     1,000 mg 200 mL/hr over 60 Minutes Intravenous Every 24 hours 09/11/12 1123 09/13/12 0829   09/10/12 1400  vancomycin (VANCOCIN) 1,500 mg in sodium chloride 0.9 % 500 mL IVPB     1,500 mg 250 mL/hr over 120 Minutes Intravenous  Once 09/10/12 1223 09/10/12 1534   09/10/12 1300  piperacillin-tazobactam (ZOSYN) IVPB 2.25 g  Status:  Discontinued     2.25 g 100 mL/hr over 30 Minutes Intravenous 3 times per day 09/10/12 1223 09/11/12 1111     Assessment:  Vancomycin trough level reported as 29 mcg/ml.  Goal of Therapy:   Vancomycin trough level 10-15 mcg/ml  Plan:   Hold Vancomycin for now.  Draw random Vancomycin level in AM and resume Vancomycin at adjusted dose when appropriate.  Laurena Bering, Pharm.D.  09/21/2012 11:10 PM

## 2012-09-21 NOTE — Progress Notes (Signed)
[facility] PROGRESS NOTE  Troy Cook ZOX:[id number] DOB: [birth year] DOA: 09/10/2012 PCP: Daisy Floro, MD  Complicated man from [facility]- came in septic.  Was treating his presumed MRSA bacteremia with 6 weeks of vanc, when his AICD fired several times on the night of 4/4- he then decided he wanted his AICD turned off and to become comfort care.  He did appear to be near death but has since recovered and expressed that he would like to be treated for the infection and he would consider seeing plastic surgery as an outpatient.   Assessment/Plan: Severe Sepsis - multifactorial - MRSA UTI, - patient most likely has staph aureus bacteremia which will explain why he had persistent mrsa utis at SNF prior to admission to [place]. Started empiric iv vancomycin and Zosyn on admission . Zosyn stopped 3/31 after culture results back. Continued Vancomycin iv . ID consultation:   -TEE negative --given ICD presence and likely occult bacteremia,  treat for 6 weeks with IV vancomycin  -PICC Line He should have weekly CBC BMP and vancomycin troughs faxed to Dr. Algis Liming at [phone].  --then stop abx and repeat surveillance cultures 2 weeks after last dose of abx - outpatient referral to plastic surgeon- family states they do not want to see a plastic surgeon until his infection in his blood is cleared   SBO - probable distal obstruction - ? From constipation vs adhesions from prior surgery. Had NG tube, suctioning, bowel rest from 3/29-3/30 with minimal improvement and minimal fluid suctioned . NG tube removed 09/11/12. patient is too ill for surgical intervention. Constipation resolved by 3/31 and SBO resolved-    AKI - multifactorial - suspect from direct infectious injury to the tubules after persistent staph aureus infection , combined with dehydration - iv fluids and closely monitor status - improved by 09/11/12. Iv fluids stopped 09/12/12. Renal function returned to normal on 4/4 - now again with  new renal failure creatinine up to 1.5 on 4/8.    Severe diarrhea - ? Cause  c diff is negative  Imodium TID   Stage IV decub ulcer - air mattress overlay. wound care consult obtained on 3/30 and patient started hydrotherapy 3/31, wound vac placed 4/3, ? outpatient referral to plastic surgery   Cardiomyopathy s/p AICD - last EF 35%. Medical management- AICD turned off- amiodarone for comfort  Hyperkalemia secondary to renal failure - resolved with D50 and insulin and hydration   Hyponatremia from dehydration - resolved   DM 2 - SSI   Afib with RVR - decompensated from sepsis - started low dose coreg but has been held secondary to dec BP.  - as was comfort care- resumed coumadin  amiodarone started for AICD firing- AICD turned off   Leucocytosis: resolved   Pain- was on morphine gtt as comfort care, weaned down and placed on PRNs   Code Status: DNR Family Communication: patient Disposition Plan: SNF with PICC/abx/wound vac vs LTACH    Consultants:  EP  Cards  Palliative  ID   Procedures:  TEE 4/2  Antibiotics:  vanc  HPI/Subjective: C/o diarrhea   Objective: Filed Vitals:   09/20/12 0539 09/20/12 0928 09/20/12 2100 09/21/12 0524  BP: 124/79 98/67 123/73 109/43  Pulse: 70 74 86 79  Temp: 98.9 F (37.2 C) 97.7 F (36.5 C) 98.2 F (36.8 C) 97.7 F (36.5 C)  TempSrc: Oral  Oral Oral  Resp: 19 18 18 18   Height:      Weight:   93  kg (205 lb 0.4 oz)   SpO2: 100% 93% 96% 99%    Intake/Output Summary (Last 24 hours) at 09/21/12 0946 Last data filed at 09/21/12 0830  Gross per 24 hour  Intake      0 ml  Output    700 ml  Net   -700 ml   Filed Weights   09/15/12 2119 09/16/12 2012 09/20/12 2100  Weight: 109.498 kg (241 lb 6.4 oz) 102.4 kg (225 lb 12 oz) 93 kg (205 lb 0.4 oz)    Exam:   General:  Alert   Cardiovascular: irreg irreg , no murmur   Respiratory:clear, no wheezing  Abdomen: +BS, soft, NT  Musculoskeletal: moves all 4 ext -  very weak  Data Reviewed: Basic Metabolic Panel:  Recent Labs Lab 09/15/12 0547 09/16/12 2330 09/17/12 0117 09/20/12 1002 09/21/12 0532  NA 141 133*  --  141 138  K 4.0 3.1*  --  4.2 3.7  CL 111 102  --  107 106  CO2 26 22  --  22 23  GLUCOSE 128* 348*  --  117* 126*  BUN 23 25*  --  49* 43*  CREATININE 0.66 0.57  --  1.50* 1.42*  CALCIUM 7.8* 7.7*  --  8.5 8.0*  MG  --   --  1.5  --   --    Liver Function Tests: No results found for this basename: AST, ALT, ALKPHOS, BILITOT, PROT, ALBUMIN,  in the last 168 hours No results found for this basename: LIPASE, AMYLASE,  in the last 168 hours No results found for this basename: AMMONIA,  in the last 168 hours CBC:  Recent Labs Lab 09/15/12 0547 09/20/12 1002 09/21/12 0532  WBC 10.2 9.5 8.0  HGB 8.1* 9.9* 8.8*  HCT 24.6* 29.9* 26.5*  MCV 86.0 85.2 84.7  PLT 272 304 317   Cardiac Enzymes: No results found for this basename: CKTOTAL, CKMB, CKMBINDEX, TROPONINI,  in the last 168 hours BNP (last 3 results)  Recent Labs  10/26/11 1440  PROBNP 2287.0*   CBG:  Recent Labs Lab 09/17/12 0155 09/20/12 1208 09/20/12 1656 09/20/12 2209 09/21/12 0741  GLUCAP 330* 142* 116* 129* 120*    Recent Results (from the past 240 hour(s))  CULTURE, BLOOD (ROUTINE X 2)     Status: None   Collection Time    09/13/12  9:45 AM      Result Value Range Status   Specimen Description BLOOD LEFT ARM   Final   Special Requests BOTTLES DRAWN AEROBIC AND ANAEROBIC 10CC   Final   Culture  Setup Time 09/13/2012 15:32   Final   Culture NO GROWTH 5 DAYS   Final   Report Status 09/19/2012 FINAL   Final  CULTURE, BLOOD (ROUTINE X 2)     Status: None   Collection Time    09/13/12  9:55 AM      Result Value Range Status   Specimen Description BLOOD LEFT HAND   Final   Special Requests BOTTLES DRAWN AEROBIC ONLY 10CC   Final   Culture  Setup Time 09/13/2012 15:32   Final   Culture NO GROWTH 5 DAYS   Final   Report Status 09/19/2012 FINAL    Final  CLOSTRIDIUM DIFFICILE BY PCR     Status: None   Collection Time    09/16/12 10:35 AM      Result Value Range Status   C difficile by pcr NEGATIVE  NEGATIVE Final  MRSA PCR SCREENING  Status: None   Collection Time    09/17/12  1:23 AM      Result Value Range Status   MRSA by PCR NEGATIVE  NEGATIVE Final   Comment:            The GeneXpert MRSA Assay (FDA     approved for NASAL specimens     only), is one component of a     comprehensive MRSA colonization     surveillance program. It is not     intended to diagnose MRSA     infection nor to guide or     monitor treatment for     MRSA infections.     Studies: No results found.  Scheduled Meds: . amiodarone  200 mg Oral BID  . DULoxetine  30 mg Oral Daily  . ferrous sulfate  325 mg Oral Q breakfast  . insulin aspart  0-15 Units Subcutaneous TID WC  . insulin aspart  0-5 Units Subcutaneous QHS  . loperamide  2 mg Oral TID  . ondansetron (ZOFRAN) IV  4 mg Intravenous Q6H  . simvastatin  20 mg Oral QPM  . tamsulosin  0.4 mg Oral Daily  . vancomycin  750 mg Intravenous Q12H  . warfarin  4 mg Oral ONCE-1800  . Warfarin - Pharmacist Dosing Inpatient   Does not apply q1800   Continuous Infusions: . morphine 0.3 mg/hr (09/20/12 1440)    Principal Problem:   Sepsis Active Problems:   Normocytic anemia   Dyslipidemia   Atrial fibrillation   ICD (implantable cardiac defibrillator) in place   Chronic systolic heart failure   Pressure ulcer stage IV   UTI (urinary tract infection)   Hyperkalemia   SBO (small bowel obstruction)   AKI (acute kidney injury)   MRSA infection   MRSA (methicillin resistant Staphylococcus aureus) septicemia   Bacteremia   ICD (implantable cardiac defibrillator) infection   Diabetes      Troy Cook  [facility] Pager [phone]. If 7PM-7AM, please contact night-coverage at [email or web address], password [facility] 09/21/2012, 9:46 AM  LOS: 11 days

## 2012-09-21 NOTE — Evaluation (Signed)
Physical Therapy Evaluation Patient Details Name: Troy Cook MRN: [id number] DOB: 08-19-1931 Today's Date: 09/21/2012 Time: 1410-1440 PT Time Calculation (min): 30 min  PT Assessment / Plan / Recommendation Clinical Impression  79 yo male with multiple medical conditions and treatments is essentially bedbound.  He wants to increase mobility.  He takes excessive time and will need pain med prior to therapy. Anticpate he will need prolonged recovery and will benefit from [facility] or SNF     PT Assessment  Patient needs continued PT services    Follow Up Recommendations  CIR;LTACH    Does the patient have the potential to tolerate intense rehabilitation      Barriers to Discharge        Equipment Recommendations  None recommended by PT    Recommendations for Other Services OT consult   Frequency      Precautions / Restrictions Precautions Precaution Comments: rectal tube, foley, PICC line   Pertinent Vitals/Pain Pt with loud complaints of pain when he is touched without warning.  Recommend med prior to treatment      Mobility  Bed Mobility Bed Mobility: Rolling Right;Rolling Left Rolling Right: 1: +2 Total assist Rolling Right: Patient Percentage: 10% Rolling Left: 1: +2 Total assist Rolling Left: Patient Percentage: 10% Details for Bed Mobility Assistance: pt is able to initate rolling by reaching with arm.  He is not ablet to move leg to assist with rolling. he is unable to turn head to right to assist Transfers Transfers: Not assessed Ambulation/Gait Ambulation/Gait Assistance: Not tested (comment)    Exercises General Exercises - Lower Extremity Ankle Circles/Pumps: AROM;Both;10 reps;Supine Quad Sets: AROM;Both;5 reps;Supine Short Arc Quad: AROM;Both;5 reps;Supine Other Exercises Other Exercises: adb. sets. Other Exercises: bilateral UE hip to hip to activate core   PT Diagnosis: Generalized weakness;Acute pain;Difficulty walking;Abnormality of gait  PT  Problem List: Decreased strength;Decreased range of motion;Pain;Decreased mobility PT Treatment Interventions:     PT Goals Acute Rehab PT Goals PT Goal Formulation: With patient Pt will Roll Supine to Right Side: with mod assist PT Goal: Rolling Supine to Right Side - Progress: Goal set today Pt will Roll Supine to Left Side: with mod assist PT Goal: Rolling Supine to Left Side - Progress: Goal set today Pt will Perform Home Exercise Program: with min assist PT Goal: Perform Home Exercise Program - Progress: Goal set today  Visit Information  Assistance Needed: +1    Subjective Data  Subjective: pt very verbal. He wants to know in detail what will be happening before he is touched Patient Stated Goal: to be able to walk with a walker   Prior Functioning  Home Living Lives With: Spouse Additional Comments: lives at [facility].  Pt reports he is working on where would be the best place for him to go Prior Function Level of Independence: Needs assistance Communication Communication: No difficulties    Cognition  Cognition Overall Cognitive Status: Appears within functional limits for tasks assessed/performed Arousal/Alertness: Awake/alert Orientation Level: Appears intact for tasks assessed Behavior During Session: [facility] for tasks performed Cognition - Other Comments: pt does not like to be interrupted or rushed.      Extremity/Trunk Assessment Right Lower Extremity Assessment RLE ROM/Strength/Tone: Deficits RLE ROM/Strength/Tone Deficits: Pt does not like to have his legs touched. He cries out in pain with attempts to lift legs. He is able to activate quad set and tolerate folded pillow under knee to perform partial SAQ.  He can do active ankle ROM in patial  range Left Lower Extremity Assessment LLE ROM/Strength/Tone: Deficits LLE ROM/Strength/Tone Deficits: Pt does not like to have his legs touched. He cries out in pain with attempts to lift legs. He is able to initialte  quad set and  tolerate folded pillow under knee and to perform paprtial SAQ.  He can do active ankle ROM in patial range   Balance    End of Session PT - End of Session Activity Tolerance: Patient limited by pain Patient left: in bed  GP    [name], [place] [phone] 09/21/2012, 3:00 PM

## 2012-09-21 NOTE — Progress Notes (Signed)
ANTICOAGULATION CONSULT NOTE - Follow Up Consult  Pharmacy Consult for Coumadin Indication: atrial fibrillation  No Known Allergies  Patient Measurements: Height: 5\' 11"  (180.3 cm) Weight: 205 lb 0.4 oz (93 kg) IBW/kg (Calculated) : 75.3  Vital Signs: Temp: 97.7 F (36.5 C) (04/09 0524) Temp src: Oral (04/09 0524) BP: 109/43 mmHg (04/09 0524) Pulse Rate: 79 (04/09 0524)  Labs:  Recent Labs  09/20/12 1002 09/21/12 0532  HGB 9.9* 8.8*  HCT 29.9* 26.5*  PLT 304 317  LABPROT 23.0* 23.9*  INR 2.14* 2.25*  CREATININE 1.50* 1.42*    Estimated Creatinine Clearance: 50 ml/min (by C-G formula based on Cr of 1.42).  Assessment: 80yom on coumadin pta (8mg  daily) for hx afib. Coumadin was subsequently stopped as patient was to be comfort care. Patient has now improved and coumadin was resumed 4/8. INR remains therapeutic at 2.25 (likely due to the addition of amiodarone on 4/6 (this is new for him)).  Will likely require less coumadin now that amiodarone on board.  H/H down slightly, plts stable, no bleeding noted.  Goal of Therapy:  INR 2-3   Plan:  1)Repeat Coumadin 4mg  x 1 tonight 2) Daily INR  Deontra Pereyra, Drake Leach 09/21/2012,8:36 AM

## 2012-09-21 NOTE — Progress Notes (Addendum)
CRITICAL VALUE ALERT  Critical value received:  Vanc 29.0  Date of notification:  09/21/12  Time of notification:  10:50  Critical value read back:yes  Nurse who received alert:  Burley Saver, RN  MD notified (1st page):  Jeannetta Ellis, [facility] - to hold tonight's dose

## 2012-09-21 NOTE — Significant Event (Signed)
CRITICAL VALUE ALERT  Critical value received:  vanc trough 40.7 Date of notification:  09/21/12  Time of notification:  1310  Critical value read back:yes  Nurse who received alert:  ty Jasan Doughtie  Pharmicist notified: Gardenia Phlegm, RN

## 2012-09-21 NOTE — Progress Notes (Signed)
Subjective:  Feels better. No palpitations.  No longer on telemetry  Objective:  Vital Signs in the last 24 hours: BP 100/53  Pulse 90  Temp(Src) 97.6 F (36.4 C) (Oral)  Resp 18  Ht 5\' 11"  (1.803 m)  Wt 93 kg (205 lb 0.4 oz)  BMI 28.61 kg/m2  SpO2 100%  Physical Exam: Elderly WM lying in bed in NAD Lungs: No wheezing Cardiac: Controlled rate, normal S1 and S2,distant heart sounds Abdomen:  Soft, nontender, no masses, obese Extremities: 1-2+edema present  Intake/Output from previous day: 04/08 0701 - 04/09 0700 In: 0  Out: 500 [Urine:500] Weight Filed Weights   09/15/12 2119 09/16/12 2012 09/20/12 2100  Weight: 109.498 kg (241 lb 6.4 oz) 102.4 kg (225 lb 12 oz) 93 kg (205 lb 0.4 oz)    Lab Results: Basic Metabolic Panel:  Recent Labs  16/10/96 1002 09/21/12 0532  NA 141 138  K 4.2 3.7  CL 107 106  CO2 22 23  GLUCOSE 117* 126*  BUN 49* 43*  CREATININE 1.50* 1.42*   BNP    Component Value Date/Time   PROBNP 2287.0* 10/26/2011 1440    PROTIME: Lab Results  Component Value Date   INR 2.25* 09/21/2012   INR 2.14* 09/20/2012   INR 1.65* 09/16/2012    Telemetry:   Assessment/Plan: 1. Ischemic cardiomyopathy 2. A fib with RVR 3. Recurrent ventricular tachycardia settled down 4. 5. Hypokalemia and hypomagnesemia  Rec:  Switched to po amiodarone at this point.  Comfort care.    He has no symptoms of either shortness of breath or palpitations.  He will let us know if symptoms arise.  We did discuss the rationale behind turning off the defibrillator function of his device.  He is in agreement as the defibrillator firing was very uncomfortable for him.   Manage electrolytes.  Antibiotics for infection.   Corky Crafts,  MD [facility]  09/21/2012, 4:04 PM

## 2012-09-22 LAB — BASIC METABOLIC PANEL
CO2: 26 mEq/L (ref 19–32)
Chloride: 107 mEq/L (ref 96–112)
Creatinine, Ser: 1.26 mg/dL (ref 0.50–1.35)
GFR calc Af Amer: 61 mL/min — ABNORMAL LOW (ref >90)
Potassium: 3.8 mEq/L (ref 3.5–5.1)
Sodium: 138 mEq/L (ref 135–145)

## 2012-09-22 LAB — PROTIME-INR
INR: 1.96 — ABNORMAL HIGH (ref 0.00–1.49)
Prothrombin Time: 21.6 seconds — ABNORMAL HIGH (ref 11.6–15.2)

## 2012-09-22 LAB — GLUCOSE, CAPILLARY
Glucose-Capillary: 81 mg/dL (ref 70–99)
Glucose-Capillary: 129 mg/dL — ABNORMAL HIGH (ref 70–99)

## 2012-09-22 LAB — VANCOMYCIN, RANDOM: Vancomycin Rm: 23.8 ug/mL

## 2012-09-22 MED ORDER — SODIUM CHLORIDE 0.9 % IJ SOLN
10.0000 mL | INTRAMUSCULAR | Status: DC | PRN
  Administered 2012-09-22: 10 mL

## 2012-09-22 MED ORDER — WARFARIN SODIUM 5 MG PO TABS: 5.0000 mg | ORAL_TABLET | Freq: Every day | ORAL | Status: DC

## 2012-09-22 MED ORDER — VANCOMYCIN HCL IN DEXTROSE 1-5 GM/200ML-% IV SOLN: INTRAVENOUS | Status: DC

## 2012-09-22 MED ORDER — WARFARIN SODIUM 5 MG PO TABS
5.0000 mg | ORAL_TABLET | Freq: Once | ORAL | Status: DC
  Filled 2012-09-22: qty 1

## 2012-09-22 MED ORDER — HEPARIN SOD (PORK) LOCK FLUSH 100 UNIT/ML IV SOLN
250.0000 [IU] | INTRAVENOUS | Status: DC | PRN
  Administered 2012-09-22: 250 [IU]
  Filled 2012-09-22: qty 3

## 2012-09-22 MED ORDER — VANCOMYCIN HCL IN DEXTROSE 1-5 GM/200ML-% IV SOLN
1000.0000 mg | INTRAVENOUS | Status: DC
  Filled 2012-09-22: qty 200

## 2012-09-22 MED ORDER — INSULIN ASPART 100 UNIT/ML ~~LOC~~ SOLN: 0.0000 [IU] | Freq: Three times a day (TID) | SUBCUTANEOUS | Status: DC

## 2012-09-22 MED ORDER — SODIUM CHLORIDE 0.9 % IJ SOLN: 10.0000 mL | Freq: Two times a day (BID) | INTRAMUSCULAR | Status: DC

## 2012-09-22 MED ORDER — MORPHINE SULFATE (CONCENTRATE) 10 MG /0.5 ML PO SOLN: 5.0000 mg | ORAL | Status: DC | PRN

## 2012-09-22 MED ORDER — LOPERAMIDE HCL 2 MG PO CAPS: 2.0000 mg | ORAL_CAPSULE | Freq: Four times a day (QID) | ORAL | Status: AC | PRN

## 2012-09-22 MED ORDER — HEPARIN SOD (PORK) LOCK FLUSH 100 UNIT/ML IV SOLN
250.0000 [IU] | Freq: Every day | INTRAVENOUS | Status: DC
  Filled 2012-09-22: qty 3

## 2012-09-22 MED ORDER — AMIODARONE HCL 200 MG PO TABS: 200.0000 mg | ORAL_TABLET | Freq: Two times a day (BID) | ORAL | Status: DC

## 2012-09-22 NOTE — Progress Notes (Signed)
PHARMACY CONSULT NOTE-PROGRESS NOTE  Pharmacy Consult for Vancomycin/Coumadin Indication: MRSA UTI/bacteremia;  afib  Hospital Problems Principal Problem:   Sepsis Active Problems:   Normocytic anemia   Dyslipidemia   Atrial fibrillation   ICD (implantable cardiac defibrillator) in place   Chronic systolic heart failure   Pressure ulcer stage IV   UTI (urinary tract infection)   Hyperkalemia   SBO (small bowel obstruction)   AKI (acute kidney injury)   MRSA infection   MRSA (methicillin resistant Staphylococcus aureus) septicemia   Bacteremia   ICD (implantable cardiac defibrillator) infection   Diabetes  Vitals: BP 120/74  Pulse 78  Temp(Src) 97.6 F (36.4 C) (Oral)  Resp 20  Ht 5\' 11"  (1.803 m)  Wt 206 lb 5.6 oz (93.6 kg)  BMI 28.79 kg/m2  SpO2 95%  Labs: Estimated Creatinine Clearance: 56.5 ml/min (by C-G formula based on Cr of 1.26).  Vancomycin trough level = 29 mcg/ml   Microbiology: Recent Results (from the past 720 hour(s))  URINE CULTURE     Status: None   Collection Time    09/10/12 11:39 AM      Result Value Range Status   Specimen Description URINE, CATHETERIZED   Final   Special Requests NONE   Final   Culture  Setup Time 09/10/2012 17:34   Final   Colony Count >=100,000 COLONIES/ML   Final   Culture     Final   Value: METHICILLIN RESISTANT STAPHYLOCOCCUS AUREUS     Note: RIFAMPIN AND GENTAMICIN SHOULD NOT BE USED AS SINGLE DRUGS FOR TREATMENT OF STAPH INFECTIONS. CRITICAL RESULT CALLED TO, READ BACK BY AND VERIFIED WITH: TY H@[id number]  ON [id number] BY [facility]   Report Status 09/12/2012 FINAL   Final   Organism ID, Bacteria METHICILLIN RESISTANT STAPHYLOCOCCUS AUREUS   Final  MRSA PCR SCREENING     Status: None   Collection Time    09/10/12  1:49 PM      Result Value Range Status   MRSA by PCR NEGATIVE  NEGATIVE Final   Comment:            The GeneXpert MRSA Assay (FDA     approved for NASAL specimens     only), is one component of a      comprehensive MRSA colonization     surveillance program. It is not     intended to diagnose MRSA     infection nor to guide or     monitor treatment for     MRSA infections.  CULTURE, BLOOD (ROUTINE X 2)     Status: None   Collection Time    09/13/12  9:45 AM      Result Value Range Status   Specimen Description BLOOD LEFT ARM   Final   Special Requests BOTTLES DRAWN AEROBIC AND ANAEROBIC 10CC   Final   Culture  Setup Time 09/13/2012 15:32   Final   Culture NO GROWTH 5 DAYS   Final   Report Status 09/19/2012 FINAL   Final  CULTURE, BLOOD (ROUTINE X 2)     Status: None   Collection Time    09/13/12  9:55 AM      Result Value Range Status   Specimen Description BLOOD LEFT HAND   Final   Special Requests BOTTLES DRAWN AEROBIC ONLY 10CC   Final   Culture  Setup Time 09/13/2012 15:32   Final   Culture NO GROWTH 5 DAYS   Final   Report Status 09/19/2012 FINAL   Final  CLOSTRIDIUM DIFFICILE BY PCR     Status: None   Collection Time    09/16/12 10:35 AM      Result Value Range Status   C difficile by pcr NEGATIVE  NEGATIVE Final  MRSA PCR SCREENING     Status: None   Collection Time    09/17/12  1:23 AM      Result Value Range Status   MRSA by PCR NEGATIVE  NEGATIVE Final    Anti-infectives Anti-infectives   Start     Dose/Rate Route Frequency Ordered Stop   09/20/12 1000  vancomycin (VANCOCIN) IVPB 750 mg/150 ml premix  Status:  Discontinued     750 mg 150 mL/hr over 60 Minutes Intravenous Every 12 hours 09/20/12 0004 09/21/12 2306   09/20/12 0030  vancomycin (VANCOCIN) 1,500 mg in sodium chloride 0.9 % 500 mL IVPB     1,500 mg 250 mL/hr over 120 Minutes Intravenous  Once 09/20/12 0004 09/20/12 0824   09/13/12 1000  vancomycin (VANCOCIN) IVPB 1000 mg/200 mL premix  Status:  Discontinued     1,000 mg 200 mL/hr over 60 Minutes Intravenous Every 12 hours 09/13/12 0829 09/17/12 0814   09/11/12 1400  piperacillin-tazobactam (ZOSYN) IVPB 3.375 g  Status:  Discontinued     3.375  g 12.5 mL/hr over 240 Minutes Intravenous 3 times per day 09/11/12 1111 09/12/12 0831   09/11/12 1400  vancomycin (VANCOCIN) IVPB 1000 mg/200 mL premix  Status:  Discontinued     1,000 mg 200 mL/hr over 60 Minutes Intravenous Every 24 hours 09/11/12 1123 09/13/12 0829   09/10/12 1400  vancomycin (VANCOCIN) 1,500 mg in sodium chloride 0.9 % 500 mL IVPB     1,500 mg 250 mL/hr over 120 Minutes Intravenous  Once 09/10/12 1223 09/10/12 1534   09/10/12 1300  piperacillin-tazobactam (ZOSYN) IVPB 2.25 g  Status:  Discontinued     2.25 g 100 mL/hr over 30 Minutes Intravenous 3 times per day 09/10/12 1223 09/11/12 1111     Assessment:  79 y/o male patient receiving chronic coumadin for h/o afib. INR slightly subtherapeutic, noted drug interaction with amiodarone, will increase INR. Will increase dose today. Previously on 8mg  daily prior to admission.  Vancomycin trough level reported as 23 mcg/ml. Remains still above 20 despite no doses in 24 hours. Appears vancomycin has 3rd spaced and is clearing slowly.   Goal of Therapy:   Vancomycin trough level 10-15 mcg/ml  INR=2-3  Plan:   Coumadin 5mg  today  Resume vancomycin at 1g IV q24h tomorrow, will need close f/u with trough checked in 3 days as scr continues to normalize.  Verlene Mayer, PharmD, BCPS Pager [phone] 09/22/2012 11:12 AM

## 2012-09-22 NOTE — Progress Notes (Signed)
Called report to Sue Lush, Charity fundraiser at [facility].  Notified of foley, PICC line, wound vac, and AICD which is turned off.  Gave full report and answered all questions.  Per MD request, foley is left in place for discharge.  Receiving RN at facility is agreeable to capping off wound vac until patient's arrival at the facility.  PICC line has been flushed by IV team RN.  5mg  PO morphine given as pre-medication for transport at patient's request.  Vital signs stable at discharge.  Facility also made aware of patient's right heel suspected deep tissue injury.

## 2012-09-22 NOTE — Progress Notes (Signed)
Patient seen and examined, has been off morphine infusion, which was tapered off slowly over past three days and has not required a lot of prn morphine. He denies pain at this time. No signs and symptoms of withdrawal at this time  Filed Vitals:   09/22/12 1002  BP: 120/74  Pulse: 78  Temp: 97.6 F (36.4 C)  Resp: 20    Chest: Clear Bilaterally Heart : S1S2 RRR Abdomen: Soft, nontender Ext : No edema Neuro: Alert, oriented x 3  A/P Decubitus ulcer MRSA UTI Small bowel obstruction  Continue with prn morphine 5 mg q 2 hr prn pain.    Meredeth Ide Palliative Medicine Team [phone]

## 2012-09-22 NOTE — Clinical Social Work Note (Signed)
Patient medically stable to return to [facility] skilled nursing facility. Facility notified and discharge information forwarded to admissions SW. CSW facilitated transport via ambulance. Wife at the bedside and advised that ambulance called.  Genelle Bal, MSW, LCSW [phone]

## 2012-09-22 NOTE — Discharge Summary (Signed)
Physician Discharge Summary  [name]:[id number] DOB: [birth year] DOA: 09/10/2012  PCP: Murray Hodgkins at [facility]  Admit date: 09/10/2012 Discharge date: 09/22/2012   Recommendations for Outpatient Follow-up:  - He should have weekly CBC BMP and vancomycin troughs faxed to Dr. Algis Liming at [phone].  1. Wound vac for stage IV decubitus 2. flexiseal in place for diarrhea - once stool is more formed - flexi seal needs to come out 3. Schedule follow up with infectious diseases Dr. Daiva Eves - info below    Discharge Diagnoses:   Sepsis - resolved   Normocytic anemia  Dyslipidemia  Atrial fibrillation  ICD (implantable cardiac defibrillator) turned off   Chronic systolic heart failure  Pressure ulcer stage IV  UTI (urinary tract infection)  Hyperkalemia  SBO (small bowel obstruction) - from constipation - resolved   AKI (acute kidney injury) - resolved   MRSA (methicillin resistant Staphylococcus aureus) septicemia  Bacteremia Presumed   ICD (implantable cardiac defibrillator) infection  Diabetes   Discharge Condition: fair, patient has picc line, foley, flexi seal , wound vac  Still is alert and oriented  DNR Palliative Care to follow at SNF   Diet recommendation: diabetic   Filed Weights   09/16/12 2012 09/20/12 2100 09/21/12 2224  Weight: 102.4 kg (225 lb 12 oz) 93 kg (205 lb 0.4 oz) 93.6 kg (206 lb 5.6 oz)    History of present illness:  Troy Cook is a 79 y.o. male with history of CAD, CHF, DM, who has been decli having recently UTI, last urine culture positive for MRSA, on septra Ds at snf was brought to the ED today for continuing Deterioration in status, abdominal pain , constipation, weakness, to th epoint of being bed bound and had developed a decubitus ulcer. Patient is in extreme pain and weakness and is very anxious and apprehensive. History is gathered from snf records, epic records and wife at bedside .    Hospital Course:  Severe Sepsis -  multifactorial - MRSA UTI, - patient most likely has staph aureus bacteremia which will explain why he had persistent mrsa utis at SNF prior to admission to [place]. Started empiric iv vancomycin and Zosyn on admission . Zosyn stopped 3/31 after culture results back. Continued Vancomycin iv . ID consultation:  -TEE negative  --given ICD presence and likely occult bacteremia, treat for 6 weeks with IV vancomycin through PICC Line.  - He should have weekly CBC BMP and vancomycin troughs faxed to Dr. Algis Liming at [phone].  --then stop abx and repeat surveillance cultures 2 weeks after last dose of abx  - outpatient referral to plastic surgeon- after staph aureus infection treated   SBO - probable distal obstruction - ? From constipation vs adhesions from prior surgery. Had NG tube, suctioning, bowel rest from 3/29-3/30 with minimal improvement and minimal fluid suctioned . NG tube removed 09/11/12. patient is too ill for surgical intervention. Constipation resolved by 3/31 and SBO resolved-  AKI - multifactorial - suspect from direct infectious injury to the tubules after persistent staph aureus infection , combined with dehydration - iv fluids and closely monitor status - improved by 09/11/12. Iv fluids stopped 09/12/12. Renal function returned to normal on 4/4 - now again with new renal failure creatinine up to 1.5 on 4/8. New ARF is due to vancomycin - dose adjusted on 09/22/12 prior to discharge   Severe diarrhea - ? Cause  c diff is negative  Imodium prn  May remove flexi seal once  it stops Stage IV decub ulcer - air mattress overlay. wound care consult obtained on 3/30 and patient started hydrotherapy 3/31, wound vac placed 4/3, ? outpatient referral to plastic surgery  Cardiomyopathy s/p AICD - last EF 35%. Medical management- AICD turned off- amiodarone for comfort  Hyperkalemia secondary to renal failure - resolved with D50 and insulin and hydration  Hyponatremia from dehydration - resolved  DM 2 - SSI   Afib with RVR - decompensated from sepsis - started low dose coreg but has been held secondary to dec BP. - as was comfort care- resumed coumadin  amiodarone started for AICD firing- AICD turned off   Pain- was on morphine gtt as comfort care, weaned down and placed on PRNs       Consultations:  ID  Cards   Discharge Exam: Filed Vitals:   09/21/12 1837 09/21/12 2224 09/22/12 0631 09/22/12 1002  BP: 95/53 96/56 104/67 120/74  Pulse: 95 92 95 78  Temp: 97.5 F (36.4 C) 97.7 F (36.5 C) 97.4 F (36.3 C) 97.6 F (36.4 C)  TempSrc: Oral Oral Oral Oral  Resp: 17 18 18 20   Height:      Weight:  93.6 kg (206 lb 5.6 oz)    SpO2: 100% 100% 100% 95%    General: axox3 Cardiovascular: rrr, , no m Respiratory: ctab   Discharge Instructions      Discharge Orders   Future Orders Complete By Expires     Diet Carb Modified  As directed     Increase activity slowly  As directed         Medication List    STOP taking these medications       carvedilol 12.5 MG tablet  Commonly known as:  COREG     celecoxib 200 MG capsule  Commonly known as:  CELEBREX     doxycycline 100 MG tablet  Commonly known as:  VIBRA-TABS     DULCOLAX 10 MG suppository  Generic drug:  bisacodyl     furosemide 20 MG tablet  Commonly known as:  LASIX     glipiZIDE 10 MG tablet  Commonly known as:  GLUCOTROL     isosorbide mononitrate 30 MG 24 hr tablet  Commonly known as:  IMDUR     lisinopril 20 MG tablet  Commonly known as:  PRINIVIL,ZESTRIL     magnesium hydroxide 400 MG/5ML suspension  Commonly known as:  MILK OF MAGNESIA     metFORMIN 500 MG tablet  Commonly known as:  GLUCOPHAGE     methocarbamol 500 MG tablet  Commonly known as:  ROBAXIN     OVER THE COUNTER MEDICATION     polyethylene glycol packet  Commonly known as:  MIRALAX / GLYCOLAX     sodium phosphate 7-19 GM/118ML Enem     tamsulosin 0.4 MG Caps  Commonly known as:  FLOMAX     traMADol 50 MG tablet   Commonly known as:  ULTRAM      TAKE these medications       amiodarone 200 MG tablet  Commonly known as:  PACERONE  Take 1 tablet (200 mg total) by mouth 2 (two) times daily.     aspirin 81 MG chewable tablet  Chew 81 mg by mouth daily.     DULoxetine 30 MG capsule  Commonly known as:  CYMBALTA  Take 1 capsule (30 mg total) by mouth daily.     ferrous sulfate 325 (65 FE) MG tablet  Take 325 mg by  mouth daily with breakfast.     FLORASTOR 250 MG capsule  Generic drug:  saccharomyces boulardii  Take 250 mg by mouth 2 (two) times daily. 3 week course - 09/01/12 thru 09/21/12     insulin aspart 100 UNIT/ML injection  Commonly known as:  novoLOG  Inject 0-15 Units into the skin 3 (three) times daily before meals.     loperamide 2 MG capsule  Commonly known as:  IMODIUM  Take 1 capsule (2 mg total) by mouth 4 (four) times daily as needed for diarrhea or loose stools.     morphine CONCENTRATE 10 mg / 0.5 ml concentrated solution  Take 0.25 mLs (5 mg total) by mouth every 2 (two) hours as needed for pain.     RESOURCE PO  Take 90 mLs by mouth 2 (two) times daily.     simvastatin 20 MG tablet  Commonly known as:  ZOCOR  Take 20 mg by mouth every evening.     vancomycin 1 GM/200ML Soln  Commonly known as:  VANCOCIN  1000 mg daily  Start date 09/23/12  Stop date 11/02/12     warfarin 5 MG tablet  Commonly known as:  COUMADIN  Take 1 tablet (5 mg total) by mouth daily.       Follow-up Information   Follow up with GREEN, Lenon Curt, MD. (at [facility] )    Contact information:   [phone] [place] [address] [phone]       Schedule an appointment as soon as possible for a visit with Acey Lav, MD.   Contact information:   [address] [place] [address] [phone]        The results of significant diagnostics from this hospitalization (including imaging, microbiology, ancillary and laboratory) are listed  below for reference.    Significant Diagnostic Studies: Dg Chest Port 1 View  09/16/2012  *RADIOLOGY REPORT*  Clinical Data: Complaint of acute dyspnea.  PORTABLE CHEST - 1 VIEW  Comparison: 09/15/2012  Findings: Stable appearance of cardiac pacemaker and right central venous catheters since previous study.  Shallow inspiration.  Heart size and pulmonary vascularity are normal for age for a effort. There is suggestion of atelectasis or infiltration in the left lung base.  No blunting of costophrenic angles.  No pneumothorax. Calcification of the aorta.  Degenerative changes in the right shoulder.  IMPRESSION: No significant change since previous study.  Shallow inspiration with atelectasis in the left lung base.   Original Report Authenticated By: Burman Nieves, M.D.    Dg Chest Port 1 View  09/15/2012  *RADIOLOGY REPORT*  Clinical Data: Confirm PICC line placement  PORTABLE CHEST - 1 VIEW  Comparison: 09/10/2012  Findings: Right arm PICC terminates at the cavoatrial junction.  Increased interstitial markings/bronchitic changes.  Mild patchy opacity at the left lung base, likely atelectasis.  No pleural effusion or pneumothorax.  Mild cardiomegaly.  Left subclavian ICD.  IMPRESSION: Right arm PICC terminates at the cavoatrial junction.   Original Report Authenticated By: Charline Bills, M.D.    Dg Abd Acute W/chest  09/10/2012  *RADIOLOGY REPORT*  Clinical Data: Abdominal pain.  Constipation.  UTI.  ACUTE ABDOMEN SERIES (ABDOMEN 2 VIEW & CHEST 1 VIEW)  Comparison: 11/20/2011  Findings: AICD unchanged in position.  Mildly low lung volumes noted.  Atherosclerotic calcification of the aortic arch is present.  There is degenerative glenohumeral arthropathy bilaterally.  No free intraperitoneal gas is observed on the left side  down lateral decubitus view.  There are air-fluid levels scattered in its of dilated small bowel measuring up to 3.7 cm in diameter. Some of these air-fluid levels are probably at  differing vertical heights in the same loop of small bowel.  There is a paucity of bowel gas in the pelvis, although more rectal stool is visible.  Prominent lumbar spondylosis observed.  Dextroconvex lumbar scoliosis.  IMPRESSION:  1.  Abnormal air-fluid levels in dilated small bowel.  Appearance is suspicious for small bowel obstruction. 2.  Paucity of bowel gas in the pelvis may be due to more proximal obstruction or less likely a mass in the pelvis.  However, there is formed stool in the sigmoid colon and rectum. 3.  No free intraperitoneal gas. 4.  Atherosclerosis. 5.  Lumbar spondylosis.   Original Report Authenticated By: Gaylyn Rong, M.D.     Microbiology: Recent Results (from the past 240 hour(s))  CULTURE, BLOOD (ROUTINE X 2)     Status: None   Collection Time    09/13/12  9:45 AM      Result Value Range Status   Specimen Description BLOOD LEFT ARM   Final   Special Requests BOTTLES DRAWN AEROBIC AND ANAEROBIC 10CC   Final   Culture  Setup Time 09/13/2012 15:32   Final   Culture NO GROWTH 5 DAYS   Final   Report Status 09/19/2012 FINAL   Final  CULTURE, BLOOD (ROUTINE X 2)     Status: None   Collection Time    09/13/12  9:55 AM      Result Value Range Status   Specimen Description BLOOD LEFT HAND   Final   Special Requests BOTTLES DRAWN AEROBIC ONLY 10CC   Final   Culture  Setup Time 09/13/2012 15:32   Final   Culture NO GROWTH 5 DAYS   Final   Report Status 09/19/2012 FINAL   Final  CLOSTRIDIUM DIFFICILE BY PCR     Status: None   Collection Time    09/16/12 10:35 AM      Result Value Range Status   C difficile by pcr NEGATIVE  NEGATIVE Final  MRSA PCR SCREENING     Status: None   Collection Time    09/17/12  1:23 AM      Result Value Range Status   MRSA by PCR NEGATIVE  NEGATIVE Final   Comment:            The GeneXpert MRSA Assay (FDA     approved for NASAL specimens     only), is one component of a     comprehensive MRSA colonization     surveillance program.  It is not     intended to diagnose MRSA     infection nor to guide or     monitor treatment for     MRSA infections.     Labs: Basic Metabolic Panel:  Recent Labs Lab 09/16/12 2330 09/17/12 0117 09/20/12 1002 09/21/12 0532 09/22/12 0526  NA 133*  --  141 138 138  K 3.1*  --  4.2 3.7 3.8  CL 102  --  107 106 107  CO2 22  --  22 23 26   GLUCOSE 348*  --  117* 126* 109*  BUN 25*  --  49* 43* 37*  CREATININE 0.57  --  1.50* 1.42* 1.26  CALCIUM 7.7*  --  8.5 8.0* 8.3*  MG  --  1.5  --   --   --  Liver Function Tests: No results found for this basename: AST, ALT, ALKPHOS, BILITOT, PROT, ALBUMIN,  in the last 168 hours No results found for this basename: LIPASE, AMYLASE,  in the last 168 hours No results found for this basename: AMMONIA,  in the last 168 hours CBC:  Recent Labs Lab 09/20/12 1002 09/21/12 0532  WBC 9.5 8.0  HGB 9.9* 8.8*  HCT 29.9* 26.5*  MCV 85.2 84.7  PLT 304 317   Cardiac Enzymes: No results found for this basename: CKTOTAL, CKMB, CKMBINDEX, TROPONINI,  in the last 168 hours BNP: BNP (last 3 results)  Recent Labs  10/26/11 1440  PROBNP 2287.0*   CBG:  Recent Labs Lab 09/21/12 0741 09/21/12 1149 09/21/12 1638 09/21/12 2211 09/22/12 0729  GLUCAP 120* 159* 108* 106* 81     Time spent >30 min    Signed:  Memphis Decoteau  [facility] 09/22/2012, 11:38 AM

## 2012-09-26 ENCOUNTER — Non-Acute Institutional Stay (SKILLED_NURSING_FACILITY): Payer: PRIVATE HEALTH INSURANCE | Admitting: Nurse Practitioner

## 2012-09-26 DIAGNOSIS — F329 Major depressive disorder, single episode, unspecified: Secondary | ICD-10-CM

## 2012-09-26 DIAGNOSIS — L89610 Pressure ulcer of right heel, unstageable: Secondary | ICD-10-CM

## 2012-09-26 DIAGNOSIS — Z66 Do not resuscitate: Secondary | ICD-10-CM

## 2012-09-26 DIAGNOSIS — I509 Heart failure, unspecified: Secondary | ICD-10-CM

## 2012-09-26 DIAGNOSIS — I4891 Unspecified atrial fibrillation: Secondary | ICD-10-CM

## 2012-09-26 DIAGNOSIS — L8994 Pressure ulcer of unspecified site, stage 4: Secondary | ICD-10-CM

## 2012-09-26 DIAGNOSIS — L8990 Pressure ulcer of unspecified site, unspecified stage: Secondary | ICD-10-CM

## 2012-09-26 DIAGNOSIS — A4902 Methicillin resistant Staphylococcus aureus infection, unspecified site: Secondary | ICD-10-CM

## 2012-09-26 DIAGNOSIS — L89609 Pressure ulcer of unspecified heel, unspecified stage: Secondary | ICD-10-CM

## 2012-09-26 DIAGNOSIS — D649 Anemia, unspecified: Secondary | ICD-10-CM

## 2012-09-26 DIAGNOSIS — E119 Type 2 diabetes mellitus without complications: Secondary | ICD-10-CM

## 2012-09-26 DIAGNOSIS — L8995 Pressure ulcer of unspecified site, unstageable: Secondary | ICD-10-CM

## 2012-09-26 DIAGNOSIS — F32A Depression, unspecified: Secondary | ICD-10-CM | POA: Insufficient documentation

## 2012-09-26 DIAGNOSIS — M25569 Pain in unspecified knee: Secondary | ICD-10-CM

## 2012-09-26 NOTE — Assessment & Plan Note (Signed)
Huge sacral decubitus ulcer that has enlarged. currently the wound bed is debride with Santyl and wound treatment with Wound Vac

## 2012-09-26 NOTE — Assessment & Plan Note (Signed)
bilateral knee discomfort and swelling in the left knee.Both knees have crepitance and stiffness. There are no inflammatory changes. Discomfort improved substantially since the steroid injections done in each knee. Morphine prn available to hi.

## 2012-09-26 NOTE — Progress Notes (Signed)
Patient ID: Troy Cook, male   DOB: [birth year], 79 y.o.   MRN: [id number]  Code Status: DNR  No Known Allergies  Chief Complaint  Patient presents with  . Medical Managment of Chronic Issues    anticoagulation.  Marland Kitchen Hospitalization Follow-up    HPI: Patient is a 79 y.o. male seen for f/u hospitalization for MRSA urosepsis -treated with Vanco via PICC. The patient has Foley Cath with bloody urine out put. His Hgb is 8s prior to dc and updated CBC today pending. Coumadin was on hold since INR was high 09/23/12 and repeated INR today showed sub therapeutic--R vs B--continue to hold Coumadin for now. Sacral wound--stage IV no infected which is treated with Wound Vac presently. His is off Lasix and mild edematous in all limbs w/o SOB or cough, BNP was still elevated while in hospital around 2000s. Hyponatremia with Na 130s--BMP pending.   Anemia: worse, has been on Fe, 9.76 08/18/12, Now 8s while in hospital--takes Fe 325mg  daily.   DM, UNCOMPLICATED TYPE II  SSI to manage blood sugar.   HTN UNSPECIFIED  Controlled w/o meds.   CHRONIC SYSTOLIC HEART FAILURE  Mild dependent edema presents, no SOB or cough presently. Off Furosemide.   UTI  recurrent with MRSA, currently treated with Vanco via PICC  PRESSURE ULCER, BUTTOCK   Huge sacral decubitus ulcer that has enlarged. currently the wound bed is debride with Santyl and wound treatment with Wound Vac.   PAIN IN JOINT, KNEE   bilateral knee discomfort and swelling in the left knee.Both knees have crepitance and stiffness. There are no inflammatory changes. Discomfort improved substantially since the steroid injections done in each knee. Morphine prn available to hi.     Review of Systems: Review of Systems  Constitutional: Positive for weight loss and malaise/fatigue. Negative for fever, chills and diaphoresis.  HENT: Positive for hearing loss and neck pain. Negative for ear pain, congestion and sore throat.   Eyes: Negative for pain, discharge and  redness.  Respiratory: Positive for shortness of breath. Negative for cough, sputum production and wheezing.   Cardiovascular: Positive for leg swelling and PND. Negative for chest pain, palpitations, orthopnea and claudication.  Gastrointestinal: Negative for heartburn, nausea, vomiting, abdominal pain, diarrhea, constipation and blood in stool.  Genitourinary: Positive for frequency (Foley ) and hematuria. Negative for dysuria, urgency and flank pain.  Musculoskeletal: Positive for myalgias, back pain and joint pain.  Skin: Negative for itching and rash.       Sacral wound stage IV and right medial heel-new unable to stage-3x4cm dark red/black intact area.   Neurological: Positive for weakness (generalized). Negative for dizziness, tingling, tremors, sensory change, speech change, focal weakness, seizures and loss of consciousness.  Endo/Heme/Allergies: Negative for environmental allergies and polydipsia. Does not bruise/bleed easily.  Psychiatric/Behavioral: Positive for depression and memory loss. Negative for suicidal ideas and hallucinations. The patient is nervous/anxious. The patient does not have insomnia.      Past Medical History  Diagnosis Date  . Atrial fibrillation   . Diabetes mellitus   . Hypertension   . Coronary artery disease   . Hyperlipemia   . ICD (implantable cardiac defibrillator) in place   . CHF (congestive heart failure)    Past Surgical History  Procedure Laterality Date  . Cholecystectomy    . Cardiac defibrillator placement  11/19/2011    single chamber  . Cardiac catheterization    . Tee without cardioversion N/A 09/14/2012    Procedure: TRANSESOPHAGEAL ECHOCARDIOGRAM (TEE);  Surgeon: Corky Crafts, MD;  Location: [facility] ENDOSCOPY;  Service: Cardiovascular;  Laterality: N/A;  Andrea/ Varanasi   Social History:   reports that he quit smoking about 11 years ago. He has never used smokeless tobacco. He reports that he does not drink alcohol or use  illicit drugs.  No family history on file.  Medications: Patient's Medications  New Prescriptions   No medications on file  Previous Medications   AMIODARONE (PACERONE) 200 MG TABLET    Take 1 tablet (200 mg total) by mouth 2 (two) times daily.   ASPIRIN 81 MG CHEWABLE TABLET    Chew 81 mg by mouth daily.   DULOXETINE (CYMBALTA) 30 MG CAPSULE    Take 1 capsule (30 mg total) by mouth daily.   FERROUS SULFATE 325 (65 FE) MG TABLET    Take 325 mg by mouth daily with breakfast.   INSULIN ASPART (NOVOLOG) 100 UNIT/ML INJECTION    Inject 0-15 Units into the skin 3 (three) times daily before meals.   LOPERAMIDE (IMODIUM) 2 MG CAPSULE    Take 1 capsule (2 mg total) by mouth 4 (four) times daily as needed for diarrhea or loose stools.   MORPHINE SULFATE (MORPHINE CONCENTRATE) 10 MG / 0.5 ML CONCENTRATED SOLUTION    Take 0.25 mLs (5 mg total) by mouth every 2 (two) hours as needed for pain.   NUTRITIONAL SUPPLEMENTS (RESOURCE PO)    Take 90 mLs by mouth 2 (two) times daily.   SACCHAROMYCES BOULARDII (FLORASTOR) 250 MG CAPSULE    Take 250 mg by mouth 2 (two) times daily. 3 week course - 09/01/12 thru 09/21/12   SIMVASTATIN (ZOCOR) 20 MG TABLET    Take 20 mg by mouth every evening.   VANCOMYCIN (VANCOCIN) 1 GM/200ML SOLN    1000 mg daily Start date 09/23/12 Stop date 11/02/12   WARFARIN (COUMADIN) 5 MG TABLET    Take 1 tablet (5 mg total) by mouth daily.  Modified Medications   No medications on file  Discontinued Medications   No medications on file     Physical Exam: Physical Exam  Constitutional: He appears well-developed and well-nourished.  HENT:  Head: Normocephalic and atraumatic.  Eyes: Conjunctivae and EOM are normal. Pupils are equal, round, and reactive to light.  Neck: Normal range of motion. Neck supple. No JVD present. No thyromegaly present.  Cardiovascular: Normal rate and intact distal pulses.  An irregular rhythm present.  Murmur heard. Pulmonary/Chest: Effort normal and  breath sounds normal. He has no wheezes. He has no rales.  Abdominal: Soft. Bowel sounds are normal.  Genitourinary: Rectum normal.  Musculoskeletal:       Right shoulder: He exhibits decreased range of motion.       Left shoulder: He exhibits decreased range of motion and tenderness.       Right knee: Tenderness found.       Left knee: Tenderness found.  Lymphadenopathy:    He has no cervical adenopathy.  Skin:     Pressure ulcer, stage IV, wound vac and Santyl, Flexseal in place for diarrhea. Pressure wound bed is filled with granulated tissue.  New pressure area is intact with dark red/black medial right heel.   Psychiatric: His mood appears anxious. His affect is angry. His affect is not blunt, not labile and not inappropriate. His speech is not rapid and/or pressured, not delayed, not tangential and not slurred. He is agitated. He is not aggressive, not hyperactive, not slowed, not withdrawn and not combative. Thought  content is not paranoid and not delusional. Cognition and memory are impaired. He expresses impulsivity. He does not express inappropriate judgment. He exhibits a depressed mood. He exhibits abnormal recent memory.    Filed Vitals:   09/25/12 1531  BP: 122/64  Pulse: 74  Temp: 98 F (36.7 C)  Resp: 18      Labs reviewed: Basic Metabolic Panel:  Recent Labs  95/28/41 1440 10/26/11 2047  09/16/12 2330 09/17/12 0117 09/20/12 1002 09/21/12 0532 09/22/12 0526  NA 137  --   < > 133*  --  141 138 138  K 4.5  --   < > 3.1*  --  4.2 3.7 3.8  CL 101  --   < > 102  --  107 106 107  CO2 29  --   < > 22  --  22 23 26   GLUCOSE 124*  --   < > 348*  --  117* 126* 109*  BUN 21  --   < > 25*  --  49* 43* 37*  CREATININE 0.59  --   < > 0.57  --  1.50* 1.42* 1.26  CALCIUM 8.7  --   < > 7.7*  --  8.5 8.0* 8.3*  MG  --   --   --   --  1.5  --   --   --   TSH  --  1.544  --   --   --   --   --   --   < > = values in this interval not displayed. Liver Function  Tests:  Recent Labs  10/26/11 2047 10/27/11 0345 09/10/12 1004  AST 14 13 8   ALT 10 10 9   ALKPHOS 59 55 92  BILITOT 0.9 0.8 0.3  PROT 7.5 6.9 7.7  ALBUMIN 3.5 3.2* 2.1*    Recent Labs  09/10/12 1004  LIPASE 15   No results found for this basename: AMMONIA,  in the last 8760 hours CBC:  Recent Labs  10/26/11 1440  11/12/11 1227 09/10/12 1004  09/15/12 0547 09/20/12 1002 09/21/12 0532  WBC 9.5  < > 9.4 45.4*  < > 10.2 9.5 8.0  NEUTROABS 7.4  --  6.7 43.6*  --   --   --   --   HGB 12.1*  < > 10.8* 11.6*  < > 8.1* 9.9* 8.8*  HCT 37.8*  < > 33.7* 33.4*  < > 24.6* 29.9* 26.5*  MCV 91.1  < > 91.1 81.3  < > 86.0 85.2 84.7  PLT 248  < > 259.0 427*  < > 272 304 317  < > = values in this interval not displayed. Lipid Panel: No results found for this basename: CHOL, HDL, LDLCALC, TRIG, CHOLHDL, LDLDIRECT,  in the last 8760 hours Anemia Panel: No results found for this basename: FOLATE, IRON, VITAMINB12,  in the last 8760 hours  Past Procedures: CXR- 09/15/2012: IMPRESSION: Right arm PICC terminates at the cavoatrial junction.   CXR- 09/16/2012: IMPRESSION: No significant change since previous study.  Shallow inspiration with atelectasis in the left lung base.     Assessment/Plan Diabetes SSI to manage blood sugar   Congestive heart failure Mild dependent edema presents, no SOB or cough presently. Off Furosemide   MRSA infection  recurrent with MRSA, currently treated with Vanco via PICC-stop date 11/02/12. Bloody urine seen in the Foley collection bad   Pressure ulcer stage IV Huge sacral decubitus ulcer that has enlarged. currently the wound bed is  debride with Santyl and wound treatment with Wound Vac   Normocytic anemia May have a component of blood loss anemia in setting of chronic disease anemia, continue Fe, update CBC  Knee joint pain bilateral knee discomfort and swelling in the left knee.Both knees have crepitance and stiffness. There are no  inflammatory changes. Discomfort improved substantially since the steroid injections done in each knee. Morphine prn available to hi.      Pressure ulcer of heel Right medial aspect with size of 4x3cm black intact area.   Atrial fibrillation Rate controlled on Amiodarone 200mg  bid, Coumadin on hold R>B, continue with ASA 81mg   Depression Takes Cymbalta 30mg --the patient has flat affect, resistant to care, oral intake is poor--may needs to increase to 60mg  daily.     Family/ Staff Communication: intensive wound care/skin care.   Goals of Care: SNF for palliative care  Labs/tests ordered: CBC and BMP

## 2012-09-26 NOTE — Assessment & Plan Note (Signed)
Rate controlled on Amiodarone 200mg  bid, Coumadin on hold R>B, continue with ASA 81mg 

## 2012-09-26 NOTE — Assessment & Plan Note (Signed)
Right medial aspect with size of 4x3cm black intact area.

## 2012-09-26 NOTE — Assessment & Plan Note (Addendum)
recurrent with MRSA, currently treated with Vanco via PICC-stop date 11/02/12. Bloody urine seen in the Foley collection bad

## 2012-09-26 NOTE — Assessment & Plan Note (Signed)
SSI to manage blood sugar

## 2012-09-26 NOTE — Assessment & Plan Note (Signed)
May have a component of blood loss anemia in setting of chronic disease anemia, continue Fe, update CBC

## 2012-09-26 NOTE — Assessment & Plan Note (Signed)
Mild dependent edema presents, no SOB or cough presently. Off Furosemide

## 2012-09-26 NOTE — Assessment & Plan Note (Signed)
Takes Cymbalta 30mg --the patient has flat affect, resistant to care, oral intake is poor--may needs to increase to 60mg  daily.

## 2012-09-27 LAB — CBC AND DIFFERENTIAL
Hemoglobin: 8.6 g/dL — AB (ref 13.5–17.5)
WBC: 5.9 10^3/mL

## 2012-09-27 LAB — BASIC METABOLIC PANEL
Creatinine: 1.2 mg/dL (ref 0.6–1.3)
Potassium: 4.7 mmol/L (ref 3.4–5.3)
Sodium: 138 mmol/L (ref 137–147)

## 2012-09-28 ENCOUNTER — Non-Acute Institutional Stay (SKILLED_NURSING_FACILITY): Payer: Medicare Other | Admitting: Nurse Practitioner

## 2012-09-28 DIAGNOSIS — I4891 Unspecified atrial fibrillation: Secondary | ICD-10-CM

## 2012-09-28 DIAGNOSIS — I10 Essential (primary) hypertension: Secondary | ICD-10-CM

## 2012-09-28 DIAGNOSIS — D649 Anemia, unspecified: Secondary | ICD-10-CM

## 2012-09-28 DIAGNOSIS — E785 Hyperlipidemia, unspecified: Secondary | ICD-10-CM

## 2012-09-28 DIAGNOSIS — L8994 Pressure ulcer of unspecified site, stage 4: Secondary | ICD-10-CM

## 2012-09-28 DIAGNOSIS — L8995 Pressure ulcer of unspecified site, unstageable: Secondary | ICD-10-CM

## 2012-09-28 DIAGNOSIS — E119 Type 2 diabetes mellitus without complications: Secondary | ICD-10-CM

## 2012-09-28 DIAGNOSIS — A419 Sepsis, unspecified organism: Secondary | ICD-10-CM

## 2012-09-28 DIAGNOSIS — Z5189 Encounter for other specified aftercare: Secondary | ICD-10-CM

## 2012-09-28 DIAGNOSIS — G894 Chronic pain syndrome: Secondary | ICD-10-CM

## 2012-09-28 DIAGNOSIS — L8990 Pressure ulcer of unspecified site, unspecified stage: Secondary | ICD-10-CM

## 2012-09-28 DIAGNOSIS — F329 Major depressive disorder, single episode, unspecified: Secondary | ICD-10-CM

## 2012-09-28 DIAGNOSIS — I509 Heart failure, unspecified: Secondary | ICD-10-CM

## 2012-09-28 DIAGNOSIS — R5381 Other malaise: Secondary | ICD-10-CM | POA: Insufficient documentation

## 2012-09-28 DIAGNOSIS — L89610 Pressure ulcer of right heel, unstageable: Secondary | ICD-10-CM

## 2012-09-28 DIAGNOSIS — L89609 Pressure ulcer of unspecified heel, unspecified stage: Secondary | ICD-10-CM

## 2012-09-28 DIAGNOSIS — I5022 Chronic systolic (congestive) heart failure: Secondary | ICD-10-CM

## 2012-09-28 NOTE — Assessment & Plan Note (Signed)
Vancomycin via PICC @ right arm--until 11/02/12

## 2012-09-28 NOTE — Assessment & Plan Note (Signed)
Presently his blood sugar is managed with SSI--CBG in 100s ac breakfast and dinner, 200s ac lunch.

## 2012-09-28 NOTE — Assessment & Plan Note (Signed)
Rate controlled on Amiodarone.  

## 2012-09-28 NOTE — Assessment & Plan Note (Signed)
Persisted even if with Cymbalta 30mg 

## 2012-09-28 NOTE — Assessment & Plan Note (Signed)
Turned off while in hospital

## 2012-09-28 NOTE — Assessment & Plan Note (Signed)
Right medial heel, intact, unable to stage, pressure reduction.

## 2012-09-28 NOTE — Assessment & Plan Note (Signed)
Hospice referral and supportive care.

## 2012-09-28 NOTE — Assessment & Plan Note (Signed)
Vancomycin via PICC until 11/02/12

## 2012-09-28 NOTE — Assessment & Plan Note (Addendum)
EF 25-30% 09/13/12, 30-35% 10/27/11, off Furosemide, trace dependent edema in hand and feet.

## 2012-09-28 NOTE — Assessment & Plan Note (Signed)
Allover--even if lift up his blanket he crys out for pain, has Morphine 2 hr available to him. Dc Coumadin--painful to have finger stick to check PT/INR and current R>B-Hospice referral.

## 2012-09-28 NOTE — Assessment & Plan Note (Signed)
Controlled w/o medications.  

## 2012-09-28 NOTE — Assessment & Plan Note (Signed)
Takes Fe, Hgb 8.6 09/27/12

## 2012-09-28 NOTE — Assessment & Plan Note (Signed)
Dc Atorvastatin--questionable benefit presently.

## 2012-09-28 NOTE — Assessment & Plan Note (Signed)
Wound Vac at [facility]

## 2012-09-28 NOTE — Progress Notes (Signed)
Patient ID: Troy Cook, male   DOB: 11/18/1931, 79 y.o.   MRN: [id number]  Chief Complaint:  Chief Complaint  Patient presents with  . Medical Managment of Chronic Issues    pain, debility, poor oral intake, sacral wound, new right heel pressure ulcer.      HPI:   Poor oral intake, pain allover, total care even if wtih repositioning.  Problem List Items Addressed This Visit     ICD-9-CM   Congestive heart failure     EF 25-30% 09/13/12, 30-35% 10/27/11, off Furosemide, trace dependent edema in hand and feet.     Normocytic anemia     Takes Fe, Hgb 8.6 09/27/12    Hypertension     Controlled w/o medications    Dyslipidemia     Dc Atorvastatin--questionable benefit presently.     Atrial fibrillation     Rate controlled on Amiodarone.     Chronic systolic heart failure - Primary     Turned off while in hospital    Pressure ulcer stage IV     Wound Vac at SNF    Sepsis     Vancomycin via PICC @ right arm--until 11/02/12    ICD (implantable cardiac defibrillator) infection     Vancomycin via PICC until 11/02/12    Diabetes     Presently his blood sugar is managed with SSI--CBG in 100s ac breakfast and dinner, 200s ac lunch.     Pressure ulcer of heel     Right medial heel, intact, unable to stage, pressure reduction.     Depression     Persisted even if with Cymbalta 30mg     Chronic pain syndrome     Allover--even if lift up his blanket he crys out for pain, has Morphine 2 hr available to him. Dc Coumadin--painful to have finger stick to check PT/INR and current R>B-Hospice referral.     Debility     Hospice referral and supportive care.        Review of Systems:  Review of Systems  Constitutional: Positive for weight loss and malaise/fatigue. Negative for fever, chills and diaphoresis.  HENT: Positive for hearing loss and neck pain. Negative for ear pain, congestion and sore throat.   Eyes: Negative for pain, discharge and redness.  Respiratory: Positive for  shortness of breath. Negative for cough, sputum production and wheezing.   Cardiovascular: Positive for leg swelling and PND. Negative for chest pain, palpitations, orthopnea and claudication.  Gastrointestinal: Negative for heartburn, nausea, vomiting, abdominal pain, diarrhea, constipation and blood in stool.  Genitourinary: Positive for frequency (Foley ) and hematuria. Negative for dysuria, urgency and flank pain.  Musculoskeletal: Positive for myalgias, back pain and joint pain.  Skin: Negative for itching and rash.       Sacral wound stage IV and right medial heel-new unable to stage-3x4cm dark red/black intact area.   Neurological: Positive for weakness (generalized). Negative for dizziness, tingling, tremors, sensory change, speech change, focal weakness, seizures and loss of consciousness.  Endo/Heme/Allergies: Negative for environmental allergies and polydipsia. Does not bruise/bleed easily.  Psychiatric/Behavioral: Positive for depression and memory loss. Negative for suicidal ideas and hallucinations. The patient is nervous/anxious. The patient does not have insomnia.      Medications: Reviewed at [facility]   Physical Exam: Physical Exam  Constitutional: He appears well-developed and well-nourished.  HENT:  Head: Normocephalic and atraumatic.  Eyes: Conjunctivae and EOM are normal. Pupils are equal, round, and reactive to light.  Neck: Normal range of motion.  Neck supple. No JVD present. No thyromegaly present.  Cardiovascular: Normal rate and intact distal pulses.  An irregular rhythm present.  Murmur heard. Pulmonary/Chest: Effort normal and breath sounds normal. He has no wheezes. He has no rales.  Abdominal: Soft. Bowel sounds are normal.  Genitourinary: Rectum normal.  Musculoskeletal:       Right shoulder: He exhibits decreased range of motion.       Left shoulder: He exhibits decreased range of motion and tenderness.       Right knee: Tenderness found.       Left knee:  Tenderness found.  Lymphadenopathy:    He has no cervical adenopathy.  Skin:     Pressure ulcer, stage IV, wound vac and Santyl, Flexseal in place for diarrhea. Pressure wound bed is filled with granulated tissue.  New pressure area is intact with dark red/black medial right heel.   Psychiatric: His mood appears anxious. His affect is angry. His affect is not blunt, not labile and not inappropriate. His speech is not rapid and/or pressured, not delayed, not tangential and not slurred. He is agitated. He is not aggressive, not hyperactive, not slowed, not withdrawn and not combative. Thought content is not paranoid and not delusional. Cognition and memory are impaired. He expresses impulsivity. He does not express inappropriate judgment. He exhibits a depressed mood. He exhibits abnormal recent memory.         Filed Vitals:   09/28/12 1030  BP: 116/60  Pulse: 77  Temp: 97.9 F (36.6 C)  TempSrc: Tympanic  Resp: 20      Labs reviewed: Basic Metabolic Panel:  Recent Labs  21/30/86 1440 10/26/11 2047  09/16/12 2330 09/17/12 0117 09/20/12 1002 09/21/12 0532 09/22/12 0526 09/27/12  NA 137  --   < > 133*  --  141 138 138 138  K 4.5  --   < > 3.1*  --  4.2 3.7 3.8 4.7  CL 101  --   < > 102  --  107 106 107  --   CO2 29  --   < > 22  --  22 23 26   --   GLUCOSE 124*  --   < > 348*  --  117* 126* 109*  --   BUN 21  --   < > 25*  --  49* 43* 37* 29*  CREATININE 0.59  --   < > 0.57  --  1.50* 1.42* 1.26 1.2  CALCIUM 8.7  --   < > 7.7*  --  8.5 8.0* 8.3*  --   MG  --   --   --   --  1.5  --   --   --   --   TSH  --  1.544  --   --   --   --   --   --   --   < > = values in this interval not displayed.  Liver Function Tests:  Recent Labs  10/26/11 2047 10/27/11 0345 09/10/12 1004  AST 14 13 8   ALT 10 10 9   ALKPHOS 59 55 92  BILITOT 0.9 0.8 0.3  PROT 7.5 6.9 7.7  ALBUMIN 3.5 3.2* 2.1*    CBC:  Recent Labs  10/26/11 1440  11/12/11 1227 09/10/12 1004   09/15/12 0547 09/20/12 1002 09/21/12 0532 09/27/12  WBC 9.5  < > 9.4 45.4*  < > 10.2 9.5 8.0 5.9  NEUTROABS 7.4  --  6.7 43.6*  --   --   --   --   --  HGB 12.1*  < > 10.8* 11.6*  < > 8.1* 9.9* 8.8* 8.6*  HCT 37.8*  < > 33.7* 33.4*  < > 24.6* 29.9* 26.5* 27*  MCV 91.1  < > 91.1 81.3  < > 86.0 85.2 84.7  --   PLT 248  < > 259.0 427*  < > 272 304 317 363  < > = values in this interval not displayed.  Anemia Panel: No results found for this basename: IRON, FOLATE, VITAMINB12,  in the last 8760 hours  Significant Diagnostic Results:  10/25/12   Venous Doppler RLE  Summary:  - Technically difficult due to moderate interstitial   throughout the entire right leg and from the popliteal   fossa to the ankle on the left. - No obvious evidence of deep vein thrombosis involving the   visualized veins of the right lower extremity. - No obvious evidence of deep vein thrombosis involving the   visualized veins of the left lower extremity. - No obvious evidence of superficial thrombus of the right   or left lower extremity - No evidence of Baker's cyst on the right or left. Other specific details can be found in the table(s) above.    Prepared and Electronically Authenticated by   CT angio chest  IMPRESSION:   1.  Negative for pulmonary embolism.   2.  Overall findings compatible with pulmonary edema with small bilateral effusions and bibasilar opacities, right greater than left, atelectasis versus infiltrate.   3.  Apparent enlargement of the right atrium and main pulmonary artery, nonspecific though may be seen in the setting of pulmonary arterial hypertension.  Further evaluation with cardiac echo may be performed as clinically indicated.   4. Nonspecific mediastinal lymphadenopathy, possibly reactive in etiology.    Assessment/Plan Chronic systolic heart failure Turned off while in hospital  Pressure ulcer stage IV Wound Vac at SNF  Sepsis Vancomycin via PICC @  right arm--until 11/02/12  ICD (implantable cardiac defibrillator) infection Vancomycin via PICC until 11/02/12  Depression Persisted even if with Cymbalta 30mg   Congestive heart failure EF 25-30% 09/13/12, 30-35% 10/27/11, off Furosemide, trace dependent edema in hand and feet.   Atrial fibrillation Rate controlled on Amiodarone.   Normocytic anemia Takes Fe, Hgb 8.6 09/27/12  Diabetes Presently his blood sugar is managed with SSI--CBG in 100s ac breakfast and dinner, 200s ac lunch.   Pressure ulcer of heel Right medial heel, intact, unable to stage, pressure reduction.   Chronic pain syndrome Allover--even if lift up his blanket he crys out for pain, has Morphine 2 hr available to him. Dc Coumadin--painful to have finger stick to check PT/INR and current R>B-Hospice referral.   Dyslipidemia Dc Atorvastatin--questionable benefit presently.   Hypertension Controlled w/o medications  Debility Hospice referral and supportive care.       Family/ staff Communication: Hospice referral, comfort measures.    Goals of care: SNF for comfort care.    Labs/tests ordered none

## 2012-09-29 ENCOUNTER — Non-Acute Institutional Stay: Payer: Medicare Other | Admitting: Internal Medicine

## 2012-09-29 DIAGNOSIS — Z95810 Presence of automatic (implantable) cardiac defibrillator: Secondary | ICD-10-CM

## 2012-09-29 DIAGNOSIS — I10 Essential (primary) hypertension: Secondary | ICD-10-CM

## 2012-09-29 DIAGNOSIS — D649 Anemia, unspecified: Secondary | ICD-10-CM

## 2012-09-29 DIAGNOSIS — L8990 Pressure ulcer of unspecified site, unspecified stage: Secondary | ICD-10-CM

## 2012-09-29 DIAGNOSIS — Z515 Encounter for palliative care: Secondary | ICD-10-CM

## 2012-09-29 DIAGNOSIS — L8994 Pressure ulcer of unspecified site, stage 4: Secondary | ICD-10-CM

## 2012-09-29 DIAGNOSIS — N189 Chronic kidney disease, unspecified: Secondary | ICD-10-CM

## 2012-09-29 DIAGNOSIS — R531 Weakness: Secondary | ICD-10-CM

## 2012-09-29 DIAGNOSIS — E1122 Type 2 diabetes mellitus with diabetic chronic kidney disease: Secondary | ICD-10-CM

## 2012-09-29 DIAGNOSIS — L8995 Pressure ulcer of unspecified site, unstageable: Secondary | ICD-10-CM

## 2012-09-29 DIAGNOSIS — L89610 Pressure ulcer of right heel, unstageable: Secondary | ICD-10-CM

## 2012-09-29 DIAGNOSIS — R5383 Other fatigue: Secondary | ICD-10-CM

## 2012-09-29 DIAGNOSIS — E1129 Type 2 diabetes mellitus with other diabetic kidney complication: Secondary | ICD-10-CM

## 2012-09-29 DIAGNOSIS — L89609 Pressure ulcer of unspecified heel, unspecified stage: Secondary | ICD-10-CM

## 2012-09-29 DIAGNOSIS — R197 Diarrhea, unspecified: Secondary | ICD-10-CM

## 2012-09-29 DIAGNOSIS — A4102 Sepsis due to Methicillin resistant Staphylococcus aureus: Secondary | ICD-10-CM

## 2012-10-03 ENCOUNTER — Inpatient Hospital Stay (HOSPITAL_COMMUNITY)
Admission: EM | Admit: 2012-10-03 | Discharge: 2012-10-06 | DRG: 689 | Disposition: A | Discharge status: Skilled Nursing Facility | Attending: Internal Medicine | Admitting: Internal Medicine

## 2012-10-03 ENCOUNTER — Encounter: Payer: Self-pay | Admitting: Emergency Medicine

## 2012-10-03 ENCOUNTER — Emergency Department (HOSPITAL_COMMUNITY)

## 2012-10-03 DIAGNOSIS — E785 Hyperlipidemia, unspecified: Secondary | ICD-10-CM | POA: Diagnosis present

## 2012-10-03 DIAGNOSIS — I5022 Chronic systolic (congestive) heart failure: Secondary | ICD-10-CM | POA: Diagnosis present

## 2012-10-03 DIAGNOSIS — R0602 Shortness of breath: Secondary | ICD-10-CM

## 2012-10-03 DIAGNOSIS — Z66 Do not resuscitate: Secondary | ICD-10-CM | POA: Diagnosis present

## 2012-10-03 DIAGNOSIS — R609 Edema, unspecified: Secondary | ICD-10-CM | POA: Diagnosis present

## 2012-10-03 DIAGNOSIS — I82401 Acute embolism and thrombosis of unspecified deep veins of right lower extremity: Secondary | ICD-10-CM

## 2012-10-03 DIAGNOSIS — R5381 Other malaise: Secondary | ICD-10-CM

## 2012-10-03 DIAGNOSIS — I959 Hypotension, unspecified: Secondary | ICD-10-CM | POA: Diagnosis present

## 2012-10-03 DIAGNOSIS — A419 Sepsis, unspecified organism: Secondary | ICD-10-CM

## 2012-10-03 DIAGNOSIS — D649 Anemia, unspecified: Secondary | ICD-10-CM | POA: Diagnosis present

## 2012-10-03 DIAGNOSIS — N390 Urinary tract infection, site not specified: Principal | ICD-10-CM | POA: Diagnosis present

## 2012-10-03 DIAGNOSIS — A4902 Methicillin resistant Staphylococcus aureus infection, unspecified site: Secondary | ICD-10-CM

## 2012-10-03 DIAGNOSIS — I82629 Acute embolism and thrombosis of deep veins of unspecified upper extremity: Secondary | ICD-10-CM

## 2012-10-03 DIAGNOSIS — R7881 Bacteremia: Secondary | ICD-10-CM

## 2012-10-03 DIAGNOSIS — F329 Major depressive disorder, single episode, unspecified: Secondary | ICD-10-CM | POA: Diagnosis present

## 2012-10-03 DIAGNOSIS — N179 Acute kidney failure, unspecified: Secondary | ICD-10-CM | POA: Diagnosis present

## 2012-10-03 DIAGNOSIS — F3289 Other specified depressive episodes: Secondary | ICD-10-CM | POA: Diagnosis present

## 2012-10-03 DIAGNOSIS — M129 Arthropathy, unspecified: Secondary | ICD-10-CM | POA: Diagnosis present

## 2012-10-03 DIAGNOSIS — Z95810 Presence of automatic (implantable) cardiac defibrillator: Secondary | ICD-10-CM

## 2012-10-03 DIAGNOSIS — E860 Dehydration: Secondary | ICD-10-CM | POA: Diagnosis present

## 2012-10-03 DIAGNOSIS — L8994 Pressure ulcer of unspecified site, stage 4: Secondary | ICD-10-CM | POA: Diagnosis present

## 2012-10-03 DIAGNOSIS — I4891 Unspecified atrial fibrillation: Secondary | ICD-10-CM | POA: Diagnosis present

## 2012-10-03 DIAGNOSIS — L89609 Pressure ulcer of unspecified heel, unspecified stage: Secondary | ICD-10-CM | POA: Diagnosis present

## 2012-10-03 DIAGNOSIS — Z792 Long term (current) use of antibiotics: Secondary | ICD-10-CM

## 2012-10-03 DIAGNOSIS — I10 Essential (primary) hypertension: Secondary | ICD-10-CM | POA: Diagnosis present

## 2012-10-03 DIAGNOSIS — E119 Type 2 diabetes mellitus without complications: Secondary | ICD-10-CM | POA: Diagnosis present

## 2012-10-03 DIAGNOSIS — I509 Heart failure, unspecified: Secondary | ICD-10-CM | POA: Diagnosis present

## 2012-10-03 DIAGNOSIS — L89109 Pressure ulcer of unspecified part of back, unspecified stage: Secondary | ICD-10-CM | POA: Diagnosis present

## 2012-10-03 DIAGNOSIS — Y849 Medical procedure, unspecified as the cause of abnormal reaction of the patient, or of later complication, without mention of misadventure at the time of the procedure: Secondary | ICD-10-CM | POA: Diagnosis present

## 2012-10-03 DIAGNOSIS — E875 Hyperkalemia: Secondary | ICD-10-CM

## 2012-10-03 DIAGNOSIS — Z7901 Long term (current) use of anticoagulants: Secondary | ICD-10-CM

## 2012-10-03 DIAGNOSIS — K56609 Unspecified intestinal obstruction, unspecified as to partial versus complete obstruction: Secondary | ICD-10-CM

## 2012-10-03 DIAGNOSIS — Z794 Long term (current) use of insulin: Secondary | ICD-10-CM

## 2012-10-03 DIAGNOSIS — L98429 Non-pressure chronic ulcer of back with unspecified severity: Secondary | ICD-10-CM

## 2012-10-03 DIAGNOSIS — A4102 Sepsis due to Methicillin resistant Staphylococcus aureus: Secondary | ICD-10-CM | POA: Diagnosis present

## 2012-10-03 DIAGNOSIS — G894 Chronic pain syndrome: Secondary | ICD-10-CM

## 2012-10-03 HISTORY — DX: Non-pressure chronic ulcer of back with unspecified severity: L98.429

## 2012-10-03 HISTORY — DX: Unspecified osteoarthritis, unspecified site: M19.90

## 2012-10-03 HISTORY — DX: Encounter for palliative care: Z51.5

## 2012-10-03 LAB — COMPREHENSIVE METABOLIC PANEL
ALT: 9 U/L (ref 0–53)
Albumin: 1.6 g/dL — ABNORMAL LOW (ref 3.5–5.2)
Alkaline Phosphatase: 81 U/L (ref 39–117)
Chloride: 99 mEq/L (ref 96–112)
GFR calc Af Amer: 38 mL/min — ABNORMAL LOW (ref >90)
Glucose, Bld: 124 mg/dL — ABNORMAL HIGH (ref 70–99)
Potassium: 4.8 mEq/L (ref 3.5–5.1)
Sodium: 134 mEq/L — ABNORMAL LOW (ref 135–145)
Total Protein: 6.8 g/dL (ref 6.0–8.3)

## 2012-10-03 LAB — URINALYSIS, ROUTINE W REFLEX MICROSCOPIC
Glucose, UA: 100 mg/dL — AB
Ketones, ur: 15 mg/dL — AB
pH: 5 (ref 5.0–8.0)

## 2012-10-03 LAB — CBC WITH DIFFERENTIAL/PLATELET
Eosinophils Absolute: 0 10*3/uL (ref 0.0–0.7)
Lymphs Abs: 0.8 10*3/uL (ref 0.7–4.0)
MCH: 27.7 pg (ref 26.0–34.0)
Neutro Abs: 8.5 10*3/uL — ABNORMAL HIGH (ref 1.7–7.7)
Neutrophils Relative %: 85 % — ABNORMAL HIGH (ref 43–77)
Platelets: 318 10*3/uL (ref 150–400)
RBC: 3.18 MIL/uL — ABNORMAL LOW (ref 4.22–5.81)
WBC: 10 10*3/uL (ref 4.0–10.5)

## 2012-10-03 LAB — URINE MICROSCOPIC-ADD ON

## 2012-10-03 LAB — GLUCOSE, CAPILLARY
Glucose-Capillary: 109 mg/dL — ABNORMAL HIGH (ref 70–99)
Glucose-Capillary: 87 mg/dL (ref 70–99)

## 2012-10-03 LAB — POCT I-STAT TROPONIN I: Troponin i, poc: 0.01 ng/mL (ref 0.00–0.08)

## 2012-10-03 LAB — PROTIME-INR: INR: 1.37 (ref 0.00–1.49)

## 2012-10-03 LAB — CG4 I-STAT (LACTIC ACID): Lactic Acid, Venous: 1.28 mmol/L (ref 0.5–2.2)

## 2012-10-03 MED ORDER — MORPHINE SULFATE (CONCENTRATE) 10 MG /0.5 ML PO SOLN
5.0000 mg | ORAL | Status: DC | PRN
  Administered 2012-10-04 – 2012-10-06 (×2): 5 mg via ORAL
  Filled 2012-10-03 (×2): qty 0.5

## 2012-10-03 MED ORDER — DEXTROSE 5 % IV SOLN
1.0000 g | INTRAVENOUS | Status: DC
  Administered 2012-10-03 – 2012-10-04 (×2): 1 g via INTRAVENOUS
  Filled 2012-10-03 (×3): qty 10

## 2012-10-03 MED ORDER — INSULIN ASPART 100 UNIT/ML ~~LOC~~ SOLN: 0.0000 [IU] | Freq: Every day | SUBCUTANEOUS | Status: DC

## 2012-10-03 MED ORDER — ENOXAPARIN SODIUM 100 MG/ML ~~LOC~~ SOLN: 1.0000 mg/kg | Freq: Once | SUBCUTANEOUS | Status: DC

## 2012-10-03 MED ORDER — SACCHAROMYCES BOULARDII 250 MG PO CAPS
250.0000 mg | ORAL_CAPSULE | Freq: Two times a day (BID) | ORAL | Status: DC
  Administered 2012-10-03 – 2012-10-05 (×5): 250 mg via ORAL
  Filled 2012-10-03 (×7): qty 1

## 2012-10-03 MED ORDER — SODIUM CHLORIDE 0.9 % IV BOLUS (SEPSIS): 500.0000 mL | Freq: Once | INTRAVENOUS | Status: DC

## 2012-10-03 MED ORDER — SODIUM CHLORIDE 0.9 % IJ SOLN: 3.0000 mL | Freq: Two times a day (BID) | INTRAMUSCULAR | Status: DC

## 2012-10-03 MED ORDER — AMIODARONE HCL 200 MG PO TABS
200.0000 mg | ORAL_TABLET | Freq: Two times a day (BID) | ORAL | Status: DC
  Administered 2012-10-03 – 2012-10-05 (×5): 200 mg via ORAL
  Filled 2012-10-03 (×7): qty 1

## 2012-10-03 MED ORDER — FERROUS SULFATE 325 (65 FE) MG PO TABS
325.0000 mg | ORAL_TABLET | Freq: Every day | ORAL | Status: DC
  Administered 2012-10-04: 325 mg via ORAL
  Filled 2012-10-03 (×4): qty 1

## 2012-10-03 MED ORDER — SODIUM CHLORIDE 0.9 % IV SOLN
Freq: Once | INTRAVENOUS | Status: AC
  Administered 2012-10-03: 17:00:00 via INTRAVENOUS

## 2012-10-03 MED ORDER — WARFARIN SODIUM 7.5 MG PO TABS
7.5000 mg | ORAL_TABLET | Freq: Once | ORAL | Status: AC
  Administered 2012-10-03: 7.5 mg via ORAL
  Filled 2012-10-03: qty 1

## 2012-10-03 MED ORDER — ONDANSETRON HCL 4 MG/2ML IJ SOLN: 4.0000 mg | Freq: Four times a day (QID) | INTRAMUSCULAR | Status: DC | PRN

## 2012-10-03 MED ORDER — WARFARIN - PHARMACIST DOSING INPATIENT: Freq: Every day | Status: DC

## 2012-10-03 MED ORDER — ASPIRIN 81 MG PO CHEW
81.0000 mg | CHEWABLE_TABLET | Freq: Every day | ORAL | Status: DC
  Administered 2012-10-04 – 2012-10-05 (×2): 81 mg via ORAL
  Filled 2012-10-03 (×3): qty 1

## 2012-10-03 MED ORDER — ONDANSETRON HCL 4 MG PO TABS: 4.0000 mg | ORAL_TABLET | Freq: Four times a day (QID) | ORAL | Status: DC | PRN

## 2012-10-03 MED ORDER — LOPERAMIDE HCL 2 MG PO CAPS
2.0000 mg | ORAL_CAPSULE | Freq: Four times a day (QID) | ORAL | Status: DC | PRN
  Filled 2012-10-03: qty 1

## 2012-10-03 MED ORDER — POLYETHYLENE GLYCOL 3350 17 G PO PACK
17.0000 g | PACK | Freq: Every day | ORAL | Status: DC
  Administered 2012-10-05: 17 g via ORAL
  Filled 2012-10-03 (×3): qty 1

## 2012-10-03 MED ORDER — DULOXETINE HCL 30 MG PO CPEP
30.0000 mg | ORAL_CAPSULE | Freq: Every day | ORAL | Status: DC
  Administered 2012-10-04 – 2012-10-05 (×2): 30 mg via ORAL
  Filled 2012-10-03 (×3): qty 1

## 2012-10-03 MED ORDER — ENOXAPARIN SODIUM 100 MG/ML ~~LOC~~ SOLN
100.0000 mg | Freq: Once | SUBCUTANEOUS | Status: AC
  Administered 2012-10-03: 100 mg via SUBCUTANEOUS
  Filled 2012-10-03: qty 1

## 2012-10-03 MED ORDER — INSULIN ASPART 100 UNIT/ML ~~LOC~~ SOLN: 0.0000 [IU] | Freq: Three times a day (TID) | SUBCUTANEOUS | Status: DC

## 2012-10-03 NOTE — ED Notes (Signed)
Patient transported to X-ray 

## 2012-10-03 NOTE — Progress Notes (Signed)
Antibiotic & Anticoagulant CONSULT NOTE - INITIAL  Pharmacy Consult for Vancomycin and Warfarin Indication:  Recent MRSA sepsis and new DVT right brachial vein  No Known Allergies  Patient Measurements: Body Weight: 93.3 kg Height:  180 cm  Vital Signs: Temp: 98.6 F (37 C) (04/21 1603) Temp src: Rectal (04/21 1603) BP: 104/53 mmHg (04/21 1633) Pulse Rate: 100 (04/21 1458)  Labs:  Recent Labs  10/03/12 1533  WBC 10.0  HGB 8.8*  PLT 318  CREATININE 1.86*   Microbiology: Recent Results (from the past 720 hour(s))  URINE CULTURE     Status: None   Collection Time    09/10/12 11:39 AM      Result Value Range Status   Specimen Description URINE, CATHETERIZED   Final   Special Requests NONE   Final   Culture  Setup Time 09/10/2012 17:34   Final   Colony Count >=100,000 COLONIES/ML   Final   Culture     Final   Value: METHICILLIN RESISTANT STAPHYLOCOCCUS AUREUS     Note: RIFAMPIN AND GENTAMICIN SHOULD NOT BE USED AS SINGLE DRUGS FOR TREATMENT OF STAPH INFECTIONS. CRITICAL RESULT CALLED TO, READ BACK BY AND VERIFIED WITH: TY [id number]  ON [id number] BY [facility]   Report Status 09/12/2012 FINAL   Final   Organism ID, Bacteria METHICILLIN RESISTANT STAPHYLOCOCCUS AUREUS   Final  MRSA PCR SCREENING     Status: None   Collection Time    09/10/12  1:49 PM      Result Value Range Status   MRSA by PCR NEGATIVE  NEGATIVE Final   Comment:            The GeneXpert MRSA Assay (FDA     approved for NASAL specimens     only), is one component of a     comprehensive MRSA colonization     surveillance program. It is not     intended to diagnose MRSA     infection nor to guide or     monitor treatment for     MRSA infections.  CULTURE, BLOOD (ROUTINE X 2)     Status: None   Collection Time    09/13/12  9:45 AM      Result Value Range Status   Specimen Description BLOOD LEFT ARM   Final   Special Requests BOTTLES DRAWN AEROBIC AND ANAEROBIC 10CC   Final   Culture  Setup Time 09/13/2012  15:32   Final   Culture NO GROWTH 5 DAYS   Final   Report Status 09/19/2012 FINAL   Final  CULTURE, BLOOD (ROUTINE X 2)     Status: None   Collection Time    09/13/12  9:55 AM      Result Value Range Status   Specimen Description BLOOD LEFT HAND   Final   Special Requests BOTTLES DRAWN AEROBIC ONLY 10CC   Final   Culture  Setup Time 09/13/2012 15:32   Final   Culture NO GROWTH 5 DAYS   Final   Report Status 09/19/2012 FINAL   Final  CLOSTRIDIUM DIFFICILE BY PCR     Status: None   Collection Time    09/16/12 10:35 AM      Result Value Range Status   C difficile by pcr NEGATIVE  NEGATIVE Final  MRSA PCR SCREENING     Status: None   Collection Time    09/17/12  1:23 AM      Result Value Range Status   MRSA by PCR NEGATIVE  NEGATIVE Final   Comment:            The GeneXpert MRSA Assay (FDA     approved for NASAL specimens     only), is one component of a     comprehensive MRSA colonization     surveillance program. It is not     intended to diagnose MRSA     infection nor to guide or     monitor treatment for     MRSA infections.   Medical History: Past Medical History  Diagnosis Date  . Atrial fibrillation   . Diabetes mellitus   . Hypertension   . Coronary artery disease   . Hyperlipemia   . ICD (implantable cardiac defibrillator) in place   . CHF (congestive heart failure)     Medications:  Anti-infectives   Start     Dose/Rate Route Frequency Ordered Stop   10/03/12 2100  cefTRIAXone (ROCEPHIN) 1 g in dextrose 5 % 50 mL IVPB     1 g 100 mL/hr over 30 Minutes Intravenous Every 24 hours 10/03/12 1949       Assessment: This is a 79 yo male with significant past medical history.  He was recently diagnosed with MRSA sepsis and has been receiving IV Vancomycin.  He has been residing in an nursing home facility and today was found to have a clogged PICC line.  Dopplers were done and he was found to have a new DVT in the brachial vein near the PICC line site.  His dose  of Vancomycin at the nursing home was 1 gm every 24 hours with plans for 6 weeks of therapy, however, last dose charted was 4/17.  He is also receiving IV ceftriaxone.  His creatinine is higher than his last admit (1.2 >> 1.86) with an estimated clearance of ~ 33 ml/min.   His Warfarin regimen was 5 mg daily prior to admit for a history of Afib.  His INR is sub-therapeutic at 1.37 with a goal between 2.0-3.0.  He is on Amiodarone which can increase his INR.  He has been ordered SQ Lovenox as a bridge until Warfarin is therapeutic.  His H/H is low but higher than his last admit and he has no noted bleeding complications.  Goal of Therapy:  INR 2.0 - 3.0 Vancomycin trough 15-20 mcg/ml  Plan:   Will give 1gm. IV Vancomycin tonight and f/u renal function in the morning.  Warfarin 7.5mg  x 1 tonight  Monitor daily PT/INR  Nadara Mustard, PharmD., MS Clinical Pharmacist Pager:  [phone] Thank you for allowing pharmacy to be part of this patients care team. 10/03/2012,8:01 PM

## 2012-10-03 NOTE — ED Notes (Signed)
IV team paged and returned call.  St's they will remove PIC line per order

## 2012-10-03 NOTE — Progress Notes (Signed)
*  PRELIMINARY RESULTS* Vascular Ultrasound Right upper extremity venous duplex has been completed.  Preliminary findings:  DVT involving the mid right brachial vein. It appears to be surrounding the PICC line.   Farrel Demark, RDMS, RVT  10/03/2012, 2:21 PM

## 2012-10-03 NOTE — ED Provider Notes (Signed)
History     CSN: [id number]  Arrival date & time 10/03/12  1303   First MD Initiated Contact with Patient 10/03/12 1310      Chief Complaint  Patient presents with  . Vascular Access Problem    (Consider location/radiation/quality/duration/timing/severity/associated sxs/prior treatment) HPI 79 yo M with a fib, DM, HTN, recent MRSA sepsis on IV vanc presenting from the nursing home with a clogged PICC line.  Patient is a poor historian.  He states "I'm in bad shape."  He states he has chronic pain but it is not worse than normal.  He states he is short of breath, but not worse than normal.  Denies chest pain.    Past Medical History  Diagnosis Date  . Atrial fibrillation   . Diabetes mellitus   . Hypertension   . Coronary artery disease   . Hyperlipemia   . ICD (implantable cardiac defibrillator) in place   . CHF (congestive heart failure)     Past Surgical History  Procedure Laterality Date  . Cholecystectomy    . Cardiac defibrillator placement  11/19/2011    single chamber  . Cardiac catheterization    . Tee without cardioversion N/A 09/14/2012    Procedure: TRANSESOPHAGEAL ECHOCARDIOGRAM (TEE);  Surgeon: Corky Crafts, MD;  Location: [facility] ENDOSCOPY;  Service: Cardiovascular;  Laterality: N/A;  Andrea/ Varanasi    No family history on file.  History  Substance Use Topics  . Smoking status: Former Smoker    Quit date: 02/13/2001  . Smokeless tobacco: Never Used  . Alcohol Use: No      Review of Systems  Unable to perform ROS: Mental status change    Allergies  Review of patient's allergies indicates no known allergies.  Home Medications   Current Outpatient Rx  Name  Route  Sig  Dispense  Refill  . amiodarone (PACERONE) 200 MG tablet   Oral   Take 1 tablet (200 mg total) by mouth 2 (two) times daily.         Marland Kitchen aspirin 81 MG chewable tablet   Oral   Chew 81 mg by mouth daily.         . DULoxetine (CYMBALTA) 30 MG capsule   Oral   Take 1  capsule (30 mg total) by mouth daily.   30 capsule   3   . ferrous sulfate 325 (65 FE) MG tablet   Oral   Take 325 mg by mouth daily with breakfast.         . insulin aspart (NOVOLOG) 100 UNIT/ML injection   Subcutaneous   Inject 0-15 Units into the skin 3 (three) times daily before meals.   1 vial   0   . loperamide (IMODIUM) 2 MG capsule   Oral   Take 1 capsule (2 mg total) by mouth 4 (four) times daily as needed for diarrhea or loose stools.   30 capsule      . Morphine Sulfate (MORPHINE CONCENTRATE) 10 mg / 0.5 ml concentrated solution   Oral   Take 0.25 mLs (5 mg total) by mouth every 2 (two) hours as needed for pain.   30 mL   0   . Nutritional Supplements (RESOURCE PO)   Oral   Take 90 mLs by mouth 2 (two) times daily.         Marland Kitchen saccharomyces boulardii (FLORASTOR) 250 MG capsule   Oral   Take 250 mg by mouth 2 (two) times daily. 3 week course - 09/01/12 thru  09/21/12         . simvastatin (ZOCOR) 20 MG tablet   Oral   Take 20 mg by mouth every evening.         . vancomycin (VANCOCIN) 1 GM/200ML SOLN      1000 mg daily Start date 09/23/12 Stop date 11/02/12   4000 mL      . warfarin (COUMADIN) 5 MG tablet   Oral   Take 1 tablet (5 mg total) by mouth daily.           BP 100/56  Pulse 104  Temp(Src) 97.4 F (36.3 C) (Oral)  SpO2 98%  Physical Exam  Constitutional:  Ill appearing but non-toxic.  Not distress, poor participation with history and exam.  Unknown baseline.  HENT:  Head: Normocephalic and atraumatic.  Eyes: Conjunctivae are normal. Right eye exhibits no discharge. Left eye exhibits no discharge.  Cardiovascular: Normal rate, S1 normal and S2 normal.  An irregularly irregular rhythm present.  No murmur heard. Pulses:      Radial pulses are 1+ on the right side, and 1+ on the left side.  Pulmonary/Chest: Effort normal. No respiratory distress. He has no wheezes. He has no rales.  Diffuse rhonchi  Abdominal: Soft. Bowel sounds are  normal. There is no tenderness.  Musculoskeletal: He exhibits edema (symmetric pedal edema; right hand slightly swollen). He exhibits no tenderness.  Neurological: He is alert.  Skin: Skin is warm and dry.    ED Course  Procedures (including critical care time)  Labs Reviewed  CBC WITH DIFFERENTIAL - Abnormal; Notable for the following:    RBC 3.18 (*)    Hemoglobin 8.8 (*)    HCT 27.5 (*)    RDW 16.1 (*)    Neutrophils Relative 85 (*)    Neutro Abs 8.5 (*)    Lymphocytes Relative 8 (*)    All other components within normal limits  COMPREHENSIVE METABOLIC PANEL - Abnormal; Notable for the following:    Sodium 134 (*)    Glucose, Bld 124 (*)    BUN 41 (*)    Creatinine, Ser 1.86 (*)    Albumin 1.6 (*)    GFR calc non Af Amer 33 (*)    GFR calc Af Amer 38 (*)    All other components within normal limits  GLUCOSE, CAPILLARY - Abnormal; Notable for the following:    Glucose-Capillary 109 (*)    All other components within normal limits  URINE CULTURE  CULTURE, BLOOD (ROUTINE X 2)  CULTURE, BLOOD (ROUTINE X 2)  URINALYSIS, ROUTINE W REFLEX MICROSCOPIC  CG4 I-STAT (LACTIC ACID)  POCT I-STAT TROPONIN I   Dg Chest 1 View  10/03/2012  *RADIOLOGY REPORT*  Clinical Data: Fevers  CHEST - 1 VIEW  Comparison: 09/16/2012  Findings: The pacing device and right-sided PICC line are again noted.  The lungs are clear bilaterally.  No pneumothorax or sizable effusion is seen.  Degenerative change of the thoracic spine is noted.  IMPRESSION: No acute abnormality is seen.   Original Report Authenticated By: Alcide Clever, M.D.    Ct Head Wo Contrast  10/03/2012  *RADIOLOGY REPORT*  Clinical Data: Altered level of consciousness.  CT HEAD WITHOUT CONTRAST  Technique:  Contiguous axial images were obtained from the base of the skull through the vertex without contrast.  Comparison: None.  Findings: There are lacunar infarct of the right cerebellum appears remote.  Bilateral periventricular and  subcortical white matter changes are evident.  No acute cortical  infarct, hemorrhage, or mass lesion is present.  The ventricles are proportionate to the degree of atrophy.  No significant extra-axial fluid collection is present.  The paranasal sinuses and mastoid air cells are clear.  The osseous skull is intact.  No significant extracranial soft tissue injuries are evident.  IMPRESSION:  1.  No acute intracranial abnormality or significant interval change. 2.  Mild generalized atrophy likely within normal limits for age. 3.  Mild to moderate periventricular and subcortical white matter changes bilaterally, likely related to the sequelae of chronic microvascular ischemia. 4.  Remote lacunar infarct of the right cerebellum.   Original Report Authenticated By: Marin Roberts, M.D.      No diagnosis found.   Date: 10/03/2012  Rate: 96  Rhythm: atrial fibrillation  QRS Axis: normal  Intervals: normal  ST/T Wave abnormalities: nonspecific ST/T changes  Conduction Disutrbances:right bundle branch block  Narrative Interpretation: a fib with RBBB  Old EKG Reviewed: unchanged    MDM  79 yo M with multiple medical problems and recent hospitalization for MRSA sepsis on IV Vanc for total of 6 weeks.  Presenting from nursing home for clogged PICC line.  On exam he is alert, answers some questions, but seems a little altered.  Right hand (PICC in right arm) is slightly swollen, lungs with diffuse rhonchi, and blood pressure in the 80s/40s.  Will initiate complete work up.   2:00PM: Discussed with patient's wife who states that he has gone downhill over the last 7 days.  She denies any dementia or memory problems, but she does say that he has been very non-communicative.  She was surprised that he consent to coming to the hospital for the PICC line issue.  For the last 2 days, she states he has had a harder time finding words, some waxing and waning confusion and having some aimless arm movements which  is new.  She confirms that he is a DNR/DNI, but thinks he would be okay with admission and work up.    3:30PM: Doppler positive for DVT around PICC line.  Patient is on Coumadin, but last INR was sub-therapeutic.  Will give dose of Lovenox and have IV team pull the PICC line.  When discussing this with the patient, he states that he would like more information before making a decision about admission.  Blood work is pending.  Order for peripheral IV placed.  4:30: BP remains borderline.  Labs show elevated creatinine, normal lactic acid, urine and blood cultures pending.  Will call [facility] for admission for hypotension, anticoagulation, and lab abnormalities.   Phebe Colla, MD 10/03/12 1640

## 2012-10-03 NOTE — ED Notes (Signed)
Dr. Chancy Milroy in to assess pt. For admission

## 2012-10-03 NOTE — ED Notes (Signed)
Pt from Nursing home, c/o of clogged picc line i right upper arm. Per pts request to come to [facility] to have Picc replaced

## 2012-10-03 NOTE — ED Provider Notes (Signed)
I saw and evaluated the patient, reviewed the resident's note and I agree with the findings and plan. I personally evaluated the ECG and agree with the interpretation of the resident  The patient presented DVT in his right upper extremity.  The PICC line will be removed after he is started on Lovenox.  His INR is sub-therapeutic today.  Admit to the hospital for ongoing workup.  Mild hypotension responded quickly to IV fluids.  Lactate normal.  Blood and urine cultures pending.  Rectal temp normal.  CT and chest x-ray normal.  Admitted for ongoing evaluation.  Likely bridging of Lovenox Coumadin.  1. DVT (deep venous thrombosis), right   2. MRSA bacteremia   3. AKI (acute kidney injury)    Results for orders placed during the hospital encounter of 10/03/12  CBC WITH DIFFERENTIAL      Result Value Range   WBC 10.0  4.0 - 10.5 K/uL   RBC 3.18 (*) 4.22 - 5.81 MIL/uL   Hemoglobin 8.8 (*) 13.0 - 17.0 g/dL   HCT 16.1 (*) 09.6 - 04.5 %   MCV 86.5  78.0 - 100.0 fL   MCH 27.7  26.0 - 34.0 pg   MCHC 32.0  30.0 - 36.0 g/dL   RDW 40.9 (*) 81.1 - 91.4 %   Platelets 318  150 - 400 K/uL   Neutrophils Relative 85 (*) 43 - 77 %   Neutro Abs 8.5 (*) 1.7 - 7.7 K/uL   Lymphocytes Relative 8 (*) 12 - 46 %   Lymphs Abs 0.8  0.7 - 4.0 K/uL   Monocytes Relative 6  3 - 12 %   Monocytes Absolute 0.6  0.1 - 1.0 K/uL   Eosinophils Relative 0  0 - 5 %   Eosinophils Absolute 0.0  0.0 - 0.7 K/uL   Basophils Relative 0  0 - 1 %   Basophils Absolute 0.0  0.0 - 0.1 K/uL  COMPREHENSIVE METABOLIC PANEL      Result Value Range   Sodium 134 (*) 135 - 145 mEq/L   Potassium 4.8  3.5 - 5.1 mEq/L   Chloride 99  96 - 112 mEq/L   CO2 27  19 - 32 mEq/L   Glucose, Bld 124 (*) 70 - 99 mg/dL   BUN 41 (*) 6 - 23 mg/dL   Creatinine, Ser 7.82 (*) 0.50 - 1.35 mg/dL   Calcium 9.4  8.4 - 95.6 mg/dL   Total Protein 6.8  6.0 - 8.3 g/dL   Albumin 1.6 (*) 3.5 - 5.2 g/dL   AST 11  0 - 37 U/L   ALT 9  0 - 53 U/L   Alkaline  Phosphatase 81  39 - 117 U/L   Total Bilirubin 0.3  0.3 - 1.2 mg/dL   GFR calc non Af Amer 33 (*) >90 mL/min   GFR calc Af Amer 38 (*) >90 mL/min  URINALYSIS, ROUTINE W REFLEX MICROSCOPIC      Result Value Range   Color, Urine RED (*) YELLOW   APPearance TURBID (*) CLEAR   Specific Gravity, Urine 1.019  1.005 - 1.030   pH 5.0  5.0 - 8.0   Glucose, UA 100 (*) NEGATIVE mg/dL   Hgb urine dipstick LARGE (*) NEGATIVE   Bilirubin Urine LARGE (*) NEGATIVE   Ketones, ur 15 (*) NEGATIVE mg/dL   Protein, ur 213 (*) NEGATIVE mg/dL   Urobilinogen, UA 1.0  0.0 - 1.0 mg/dL   Nitrite POSITIVE (*) NEGATIVE   Leukocytes, UA  LARGE (*) NEGATIVE  GLUCOSE, CAPILLARY      Result Value Range   Glucose-Capillary 109 (*) 70 - 99 mg/dL  URINE MICROSCOPIC-ADD ON      Result Value Range   WBC, UA TOO NUMEROUS TO COUNT  <3 WBC/hpf   RBC / HPF TOO NUMEROUS TO COUNT  <3 RBC/hpf   Bacteria, UA MANY (*) RARE   Casts GRANULAR CAST (*) NEGATIVE   Urine-Other URINALYSIS PERFORMED ON SUPERNATANT    CG4 I-STAT (LACTIC ACID)      Result Value Range   Lactic Acid, Venous 1.28  0.5 - 2.2 mmol/L  POCT I-STAT TROPONIN I      Result Value Range   Troponin i, poc 0.01  0.00 - 0.08 ng/mL   Comment 3              Lyanne Co, MD 10/03/12 1707

## 2012-10-03 NOTE — H&P (Signed)
[facility] History and Physical  BERT PTACEK PPI:[id number] DOB: Mar 06, 1932 DOA: 10/03/2012  Referring physician: Azalia Bilis., ER physician PCP: Daisy Floro, MD  Specialists: None  Chief Complaint: Needs new PICC line  HPI: Troy Cook is a 79 y.o. male  With past medical history of atrial fibrillation, stage IV decubitus ulcer which is felt to contribute to a MRSA septicemia who was just discharged from the hospitalist service approximately 11 days ago to a skilled nursing facility on IV vancomycin through a PICC line. Patient was brought in to the emergency room today and was noted that the PICC line had clotted off and needed a PICC line exchange. However, patient looked to be quite dehydrated and weak. Evaluation by emergency room physicians noted borderline hypotension with a systolic blood pressure ranging from 80s to 90s, acute renal failure with a creatinine of 1.86 when previously it was 1.26 on discharge, and a very large urinary tract infection. Patient was also noted to be subtherapeutic with an hour 1.37-patient is on chronic Coumadin for atrial fibrillation. Given his multiple issues with an underlying chronic severe illness a stage IV decubitus ulcer with IV vancomycin therapy, it was felt best that patient come in the hospital for further treatment. Hospitalists were called for admission.  Review of Systems: Patient seen down in the emergency room. He is self states he is doing okay. He denies any headaches, vision changes, dysphasia, chest pain, palpitations, shortness of breath, wheeze, cough, abdominal pain, hematuria, dysuria, constipation. He has problems with frequent loose stools. This is chronic. His chronic generalized weakness the point of being nearly bedbound. He complains of his bilateral heels hurting. His review systems otherwise negative.  Past Medical History  Diagnosis Date  . Atrial fibrillation   . Diabetes mellitus   . Hypertension   .  Coronary artery disease   . Hyperlipemia   . ICD (implantable cardiac defibrillator) in place   . CHF (congestive heart failure)    Past Surgical History  Procedure Laterality Date  . Cholecystectomy    . Cardiac defibrillator placement  11/19/2011    single chamber  . Cardiac catheterization    . Tee without cardioversion N/A 09/14/2012    Procedure: TRANSESOPHAGEAL ECHOCARDIOGRAM (TEE);  Surgeon: Corky Crafts, MD;  Location: [facility] ENDOSCOPY;  Service: Cardiovascular;  Laterality: N/A;  Andrea/ Varanasi   Social History:  reports that he quit smoking about 11 years ago. He has never used smokeless tobacco. He reports that he does not drink alcohol or use illicit drugs. Patient came from a skilled nursing facility. He is near bedbound status  No Known Allergies  Family history: Hypertension  Prior to Admission medications   Medication Sig Start Date End Date Taking? Authorizing Provider  amiodarone (PACERONE) 200 MG tablet Take 1 tablet (200 mg total) by mouth 2 (two) times daily. 09/22/12  Yes Sorin Luanne Bras, MD  aspirin 81 MG chewable tablet Chew 81 mg by mouth daily.   Yes Historical Provider, MD  DULoxetine (CYMBALTA) 30 MG capsule Take 1 capsule (30 mg total) by mouth daily. 09/01/12  Yes Man X Mast, NP  ferrous sulfate 325 (65 FE) MG tablet Take 325 mg by mouth daily with breakfast.   Yes Historical Provider, MD  insulin aspart (NOVOLOG) 100 UNIT/ML injection Inject 0-15 Units into the skin 3 (three) times daily before meals. 09/22/12  Yes Sorin Luanne Bras, MD  loperamide (IMODIUM) 2 MG capsule Take 1 capsule (2 mg total) by mouth 4 (four)  times daily as needed for diarrhea or loose stools. 09/22/12  Yes Sorin Luanne Bras, MD  Morphine Sulfate (MORPHINE CONCENTRATE) 10 mg / 0.5 ml concentrated solution Take 0.25 mLs (5 mg total) by mouth every 2 (two) hours as needed for pain. 09/22/12  Yes Sorin Luanne Bras, MD  Nutritional Supplements (RESOURCE PO) Take 90 mLs by mouth 2 (two) times daily.   Yes  Historical Provider, MD  polyethylene glycol (MIRALAX / GLYCOLAX) packet Take 17 g by mouth daily.   Yes Historical Provider, MD  saccharomyces boulardii (FLORASTOR) 250 MG capsule Take 250 mg by mouth 2 (two) times daily. 3 week course - 09/01/12 thru 09/21/12   Yes Historical Provider, MD  vancomycin (VANCOCIN) 1 GM/200ML SOLN 1000 mg daily Start date 09/23/12 Stop date 11/02/12 09/22/12  Yes Sorin Luanne Bras, MD  warfarin (COUMADIN) 5 MG tablet Take 1 tablet (5 mg total) by mouth daily. 09/22/12  Yes Sorin Luanne Bras, MD   Physical Exam: Filed Vitals:   10/03/12 1458 10/03/12 1500 10/03/12 1603 10/03/12 1633  BP: 103/55 106/58  104/53  Pulse: 100     Temp:   98.6 F (37 C)   TempSrc:   Rectal   Resp: 15 19  16   SpO2: 95%   100%     General:  Alert and oriented x2, no acute distress  Eyes: Sclera nonicteric, extraocular movements are intact  ENT: Normocephalic, atraumatic, mucous membranes are dry  Neck: Supple no JVD  Cardiovascular: Irregular rhythm, borderline tachycardia  Respiratory: Clear to auscultation bilaterally  Abdomen: Soft, nontender, nondistended, positive bowel sounds  Skin: Patient is a stage IV decubitus on his back side. He also has bilateral stage I-2 decubitus ulcers on his heels  Musculoskeletal: No clubbing or cyanosis, trace edema  Psychiatric: Patient has a hot affect, question mild dementia  Neurologic: Significant generalized weakness near bedbound  Labs on Admission:  Basic Metabolic Panel:  Recent Labs Lab 09/27/12 10/03/12 1533  NA 138 134*  K 4.7 4.8  CL  --  99  CO2  --  27  GLUCOSE  --  124*  BUN 29* 41*  CREATININE 1.2 1.86*  CALCIUM  --  9.4   Liver Function Tests:  Recent Labs Lab 10/03/12 1533  AST 11  ALT 9  ALKPHOS 81  BILITOT 0.3  PROT 6.8  ALBUMIN 1.6*   CBC:  Recent Labs Lab 09/27/12 10/03/12 1533  WBC 5.9 10.0  NEUTROABS  --  8.5*  HGB 8.6* 8.8*  HCT 27* 27.5*  MCV  --  86.5  PLT 363 318    BNP (last 3  results)  Recent Labs  10/26/11 1440  PROBNP 2287.0*   CBG:  Recent Labs Lab 10/03/12 1609  GLUCAP 109*    Radiological Exams on Admission: Dg Chest 1 View  10/03/2012   IMPRESSION: No acute abnormality is seen.   Original Report Authenticated By: Alcide Clever, M.D.    Ct Head Wo Contrast  10/03/2012    IMPRESSION:  1.  No acute intracranial abnormality or significant interval change. 2.  Mild generalized atrophy likely within normal limits for age. 3.  Mild to moderate periventricular and subcortical white matter changes bilaterally, likely related to the sequelae of chronic microvascular ischemia. 4.  Remote lacunar infarct of the right cerebellum.   Original Report Authenticated By: Marin Roberts, M.D.     EKG: Independently reviewed. Atrial fibrillation with right bundle branch block  Assessment/Plan Principal Problem:   UTI (urinary tract infection):  Await cultures. Patient has had previous multiple positive he urine cultures for MRSA already on vancomycin. A previous urine in February grew out pansensitive Escherichia coli.  Active Problems:   Hypertension: Slightly hypotensive secondary dehydration.    Dyslipidemia    Atrial fibrillation: Continue Pacerone. Slightly subtherapeutic on Coumadin. Coumadin as per pharmacy.    Chronic systolic heart failure: Ejection fraction of only 25%. We'll gently hydrate    Pressure ulcer stage IV: Wound care consult. Patient normally on wound VAC. We'll also get air overlay mattress.    MRSA (methicillin resistant Staphylococcus aureus) septicemia: Continue IV vancomycin    Diabetes: Sliding scale    Pressure ulcer of heel: Wound care nurse to see    Depression  Acute on chronic renal failure: Gently hydrate given systolic heart failure.   Code Status: DO NOT RESUSCITATE  Family Communication: Plan discussed with patient and his wife at the bedside.  Disposition Plan: Hopefully back to skilled nursing  tomorrow  Time spent: 35 minutes  Hollice Espy [facility] Pager [phone]  If 7PM-7AM, please contact night-coverage [email or web address] Password [facility] 10/03/2012, 7:49 PM

## 2012-10-04 ENCOUNTER — Observation Stay (HOSPITAL_COMMUNITY)

## 2012-10-04 ENCOUNTER — Encounter (HOSPITAL_COMMUNITY): Payer: Self-pay | Admitting: General Practice

## 2012-10-04 DIAGNOSIS — F329 Major depressive disorder, single episode, unspecified: Secondary | ICD-10-CM

## 2012-10-04 DIAGNOSIS — I82409 Acute embolism and thrombosis of unspecified deep veins of unspecified lower extremity: Secondary | ICD-10-CM

## 2012-10-04 DIAGNOSIS — G894 Chronic pain syndrome: Secondary | ICD-10-CM

## 2012-10-04 DIAGNOSIS — E119 Type 2 diabetes mellitus without complications: Secondary | ICD-10-CM

## 2012-10-04 DIAGNOSIS — I509 Heart failure, unspecified: Secondary | ICD-10-CM

## 2012-10-04 DIAGNOSIS — R5381 Other malaise: Secondary | ICD-10-CM

## 2012-10-04 DIAGNOSIS — N179 Acute kidney failure, unspecified: Secondary | ICD-10-CM

## 2012-10-04 LAB — CBC
Platelets: 285 10*3/uL (ref 150–400)
RDW: 16.2 % — ABNORMAL HIGH (ref 11.5–15.5)
WBC: 7 10*3/uL (ref 4.0–10.5)

## 2012-10-04 LAB — BASIC METABOLIC PANEL
Calcium: 9.4 mg/dL (ref 8.4–10.5)
Chloride: 102 mEq/L (ref 96–112)
Creatinine, Ser: 1.94 mg/dL — ABNORMAL HIGH (ref 0.50–1.35)
GFR calc Af Amer: 36 mL/min — ABNORMAL LOW (ref >90)

## 2012-10-04 LAB — GLUCOSE, CAPILLARY

## 2012-10-04 LAB — URINE CULTURE: Colony Count: NO GROWTH

## 2012-10-04 LAB — PROTIME-INR
INR: 1.39 (ref 0.00–1.49)
Prothrombin Time: 16.7 seconds — ABNORMAL HIGH (ref 11.6–15.2)

## 2012-10-04 MED ORDER — ENOXAPARIN SODIUM 100 MG/ML ~~LOC~~ SOLN
90.0000 mg | Freq: Two times a day (BID) | SUBCUTANEOUS | Status: DC
  Administered 2012-10-04: 90 mg via SUBCUTANEOUS
  Administered 2012-10-05: 16:00:00 via SUBCUTANEOUS
  Administered 2012-10-05 – 2012-10-06 (×2): 90 mg via SUBCUTANEOUS
  Filled 2012-10-04 (×6): qty 1

## 2012-10-04 MED ORDER — SODIUM CHLORIDE 0.9 % IV SOLN
INTRAVENOUS | Status: DC
  Administered 2012-10-04 – 2012-10-05 (×2): via INTRAVENOUS

## 2012-10-04 MED ORDER — SODIUM CHLORIDE 0.9 % IJ SOLN
10.0000 mL | INTRAMUSCULAR | Status: DC | PRN
  Administered 2012-10-05 – 2012-10-06 (×3): 10 mL

## 2012-10-04 MED ORDER — ADULT MULTIVITAMIN W/MINERALS CH
1.0000 | ORAL_TABLET | Freq: Every day | ORAL | Status: DC
  Filled 2012-10-04 (×3): qty 1

## 2012-10-04 MED ORDER — VANCOMYCIN HCL IN DEXTROSE 1-5 GM/200ML-% IV SOLN
1000.0000 mg | INTRAVENOUS | Status: DC
  Filled 2012-10-04: qty 200

## 2012-10-04 MED ORDER — ENSURE COMPLETE PO LIQD
237.0000 mL | Freq: Two times a day (BID) | ORAL | Status: DC
  Administered 2012-10-04 – 2012-10-05 (×3): 237 mL via ORAL

## 2012-10-04 MED ORDER — WARFARIN SODIUM 7.5 MG PO TABS
7.5000 mg | ORAL_TABLET | Freq: Once | ORAL | Status: AC
  Administered 2012-10-04: 7.5 mg via ORAL
  Filled 2012-10-04: qty 1

## 2012-10-04 MED ORDER — VANCOMYCIN HCL IN DEXTROSE 1-5 GM/200ML-% IV SOLN
1000.0000 mg | Freq: Once | INTRAVENOUS | Status: AC
  Administered 2012-10-04: 1000 mg via INTRAVENOUS
  Filled 2012-10-04: qty 200

## 2012-10-04 NOTE — Progress Notes (Signed)
Patient ID: Troy Cook  male  ZOX:[id number]    DOB: 18-Apr-1932    DOA: 10/03/2012  PCP: Daisy Floro, MD  Assessment/Plan: Principal Problem:   UTI (urinary tract infection) - Await urine cultures, continue vancomycin and Rocephin  Active Problems:   Hypertension- BP borderline  DVT mid right brachial vein - cont coumadin    Atrial fibrillation-  -  rate controlled on amiodarone  - On aspirin and Coumadin    Chronic systolic heart failure: - EF 25%,  monitor for volume overload  Mild AK I:  - Continue gentle hydration    MRSA (methicillin resistant Staphylococcus aureus) septicemia with history of stage IV sacral decub, pressure ulcer of the heel - Continue IV vancomycin, wound care following    Diabetes- blood sugars well-controlled  DVT Prophylaxis:  Code Status:  Disposition:    Subjective: Alert and awake but appears to be confused  Objective: Weight change:   Intake/Output Summary (Last 24 hours) at 10/04/12 1241 Last data filed at 10/04/12 0700  Gross per 24 hour  Intake      0 ml  Output      0 ml  Net      0 ml   Blood pressure 110/4, pulse 82, temperature 97.5 F (36.4 C), temperature source Rectal, resp. rate 18, height 5' 10.87" (1.8 m), weight 93.6 kg (206 lb 5.6 oz), SpO2 99.00%.  Physical Exam: General: Alert and awake, not in any acute distress. CVS: irg ireg  Chest: CTAB  Abdomen: soft NT, ND, NBS Extremities: no cyanosis, clubbing or edema noted bilaterally Neuro: Generalized weakness  Lab Results: Basic Metabolic Panel:  Recent Labs Lab 10/03/12 1533 10/04/12 0530  NA 134* 137  K 4.8 4.9  CL 99 102  CO2 27 27  GLUCOSE 124* 102*  BUN 41* 41*  CREATININE 1.86* 1.94*  CALCIUM 9.4 9.4   Liver Function Tests:  Recent Labs Lab 10/03/12 1533  AST 11  ALT 9  ALKPHOS 81  BILITOT 0.3  PROT 6.8  ALBUMIN 1.6*   CBC:  Recent Labs Lab 10/03/12 1533 10/04/12 0530  WBC 10.0 7.0  NEUTROABS 8.5*  --   HGB 8.8*  8.2*  HCT 27.5* 25.6*  MCV 86.5 86.2  PLT 318 285   CBG:  Recent Labs Lab 10/03/12 1609 10/03/12 2238 10/04/12 0659 10/04/12 1055  GLUCAP 109* 87 96 95     Micro Results: No results found for this or any previous visit (from the past 240 hour(s)).  Studies/Results: Dg Chest 1 View  10/03/2012  *RADIOLOGY REPORT*  Clinical Data: Fevers  CHEST - 1 VIEW  Comparison: 09/16/2012  Findings: The pacing device and right-sided PICC line are again noted.  The lungs are clear bilaterally.  No pneumothorax or sizable effusion is seen.  Degenerative change of the thoracic spine is noted.  IMPRESSION: No acute abnormality is seen.   Original Report Authenticated By: Alcide Clever, M.D.    Ct Head Wo Contrast  10/03/2012  *RADIOLOGY REPORT*  Clinical Data: Altered level of consciousness.  CT HEAD WITHOUT CONTRAST  Technique:  Contiguous axial images were obtained from the base of the skull through the vertex without contrast.  Comparison: None.  Findings: There are lacunar infarct of the right cerebellum appears remote.  Bilateral periventricular and subcortical white matter changes are evident.  No acute cortical infarct, hemorrhage, or mass lesion is present.  The ventricles are proportionate to the degree of atrophy.  No significant extra-axial fluid collection is present.  The paranasal sinuses and mastoid air cells are clear.  The osseous skull is intact.  No significant extracranial soft tissue injuries are evident.  IMPRESSION:  1.  No acute intracranial abnormality or significant interval change. 2.  Mild generalized atrophy likely within normal limits for age. 3.  Mild to moderate periventricular and subcortical white matter changes bilaterally, likely related to the sequelae of chronic microvascular ischemia. 4.  Remote lacunar infarct of the right cerebellum.   Original Report Authenticated By: Marin Roberts, M.D.    Dg Chest Port 1 View  10/04/2012  *RADIOLOGY REPORT*  Clinical Data:  Confirm line placement  PORTABLE CHEST - 1 VIEW  Comparison: Prior chest x-ray 10/03/2012  Findings: New left upper extremity approach PICC.  The catheter tip projects over the distal SVC.  Stable position of left subclavian approach cardiac rhythm maintenance device with the lead projecting over the right ventricle.  The right upper extremity PICC has been removed.  Stable borderline cardiomegaly.  Aortic atherosclerosis is noted.  Chronic bibasilar opacities appears similar to prior. Mild vascular congestion without overt edema.  IMPRESSION:  1.  The tip of the new left upper extremity PICC projects over the distal SVC in good position. 2.  The right upper extremity PICC has been removed. 3.  Otherwise, no significant interval change in the appearance of the chest.   Original Report Authenticated By: Malachy Moan, M.D.    Dg Chest Port 1 View  09/16/2012  *RADIOLOGY REPORT*  Clinical Data: Complaint of acute dyspnea.  PORTABLE CHEST - 1 VIEW  Comparison: 09/15/2012  Findings: Stable appearance of cardiac pacemaker and right central venous catheters since previous study.  Shallow inspiration.  Heart size and pulmonary vascularity are normal for age for a effort. There is suggestion of atelectasis or infiltration in the left lung base.  No blunting of costophrenic angles.  No pneumothorax. Calcification of the aorta.  Degenerative changes in the right shoulder.  IMPRESSION: No significant change since previous study.  Shallow inspiration with atelectasis in the left lung base.   Original Report Authenticated By: Burman Nieves, M.D.    Dg Chest Port 1 View  09/15/2012  *RADIOLOGY REPORT*  Clinical Data: Confirm PICC line placement  PORTABLE CHEST - 1 VIEW  Comparison: 09/10/2012  Findings: Right arm PICC terminates at the cavoatrial junction.  Increased interstitial markings/bronchitic changes.  Mild patchy opacity at the left lung base, likely atelectasis.  No pleural effusion or pneumothorax.  Mild  cardiomegaly.  Left subclavian ICD.  IMPRESSION: Right arm PICC terminates at the cavoatrial junction.   Original Report Authenticated By: Charline Bills, M.D.    Dg Abd Acute W/chest  09/10/2012  *RADIOLOGY REPORT*  Clinical Data: Abdominal pain.  Constipation.  UTI.  ACUTE ABDOMEN SERIES (ABDOMEN 2 VIEW & CHEST 1 VIEW)  Comparison: 11/20/2011  Findings: AICD unchanged in position.  Mildly low lung volumes noted.  Atherosclerotic calcification of the aortic arch is present.  There is degenerative glenohumeral arthropathy bilaterally.  No free intraperitoneal gas is observed on the left side down lateral decubitus view.  There are air-fluid levels scattered in its of dilated small bowel measuring up to 3.7 cm in diameter. Some of these air-fluid levels are probably at differing vertical heights in the same loop of small bowel.  There is a paucity of bowel gas in the pelvis, although more rectal stool is visible.  Prominent lumbar spondylosis observed.  Dextroconvex lumbar scoliosis.  IMPRESSION:  1.  Abnormal air-fluid levels in dilated small  bowel.  Appearance is suspicious for small bowel obstruction. 2.  Paucity of bowel gas in the pelvis may be due to more proximal obstruction or less likely a mass in the pelvis.  However, there is formed stool in the sigmoid colon and rectum. 3.  No free intraperitoneal gas. 4.  Atherosclerosis. 5.  Lumbar spondylosis.   Original Report Authenticated By: Gaylyn Rong, M.D.     Medications: Scheduled Meds: . amiodarone  200 mg Oral BID  . aspirin  81 mg Oral Daily  . cefTRIAXone (ROCEPHIN)  IV  1 g Intravenous Q24H  . DULoxetine  30 mg Oral Daily  . ferrous sulfate  325 mg Oral Q breakfast  . insulin aspart  0-5 Units Subcutaneous QHS  . insulin aspart  0-9 Units Subcutaneous TID WC  . polyethylene glycol  17 g Oral Daily  . saccharomyces boulardii  250 mg Oral BID  . sodium chloride  3 mL Intravenous Q12H  . vancomycin  1,000 mg Intravenous Once  .  Warfarin - Pharmacist Dosing Inpatient   Does not apply q1800      LOS: 1 day   Maycee Blasco M.D. [facility] 10/04/2012, 12:41 PM Pager: [phone]  If 7PM-7AM, please contact night-coverage [email or web address] Password [email or web address]

## 2012-10-04 NOTE — Progress Notes (Signed)
Peripherally Inserted Central Catheter/Midline Placement  The IV Nurse has discussed with the patient and/or persons authorized to consent for the patient, the purpose of this procedure and the potential benefits and risks involved with this procedure.  The benefits include less needle sticks, lab draws from the catheter and patient may be discharged home with the catheter.  Risks include, but not limited to, infection, bleeding, blood clot (thrombus formation), and puncture of an artery; nerve damage and irregular heat beat.  Alternatives to this procedure were also discussed.  PICC/Midline Placement Documentation        Vevelyn Pat 10/04/2012, 11:35 AM

## 2012-10-04 NOTE — Progress Notes (Signed)
INITIAL NUTRITION ASSESSMENT  DOCUMENTATION CODES Per approved criteria  -N/A   INTERVENTION: 1. Ensure Complete po BID, each supplement provides 350 kcal and 13 grams of protein. 2. Recommend Regular diet to maximize oral intake 3. Recommend nutrition support if aggressive nutrition interventions desired 4. RD to continue to follow nutrition care plan  NUTRITION DIAGNOSIS: Inadequate oral intake r/t poor appetite AEB wife's report.  Goal: Intake to meet >90% of estimated nutrition needs.  Monitor:  weight trends, lab trends, I/O's, PO intake, supplement tolerance  Reason for Assessment: Malnutrition Screening  79 y.o. male  Admitting Dx: UTI (urinary tract infection)  ASSESSMENT: Hx of CAD, CHF, DM, recent UTI, last urine culture positive for MRSA. Brought from [facility] for new PICC line - Troy Cook's was clotted.  Wife at bedside confirms poor appetite presently and PTA  WOC RN saw Troy Cook today; Troy Cook with large deep stage IV sacrum. RN unable to fully evaluate wound as Troy Cook was grimacing in pain. Noted that wife told WOC RN that Troy Cook is full comfort care - this is not clear in the chart at present.  Troy Cook confirms recent poor oral intake. Taking clear liquid trays well at this time. Agreeable to oral nutrition supplements. Per recent nursing home MAR, Troy Cook receives 2 scoops of Beneprotein po BID and 90 cc Resource with medications.  Troy Cook with likely malnutrition given poor oral intake and likely weight loss. No current weight available, suspect ongoing weight loss given ongoing poor oral intake. RD unable to complete physical assessment at this time.   Height: Ht Readings from Last 1 Encounters:  10/04/12 5' 10.87" (1.8 m)    Weight: Wt Readings from Last 1 Encounters:  10/04/12 206 lb 5.6 oz (93.6 kg)    Ideal Body Weight: 172 lb  % Ideal Body Weight: 119%  Wt Readings from Last 10 Encounters:  10/04/12 206 lb 5.6 oz (93.6 kg)  09/21/12 206 lb 5.6 oz (93.6 kg)  09/21/12 206 lb  5.6 oz (93.6 kg)  09/21/12 206 lb 5.6 oz (93.6 kg)  02/19/12 232 lb 12.8 oz (105.597 kg)  11/19/11 227 lb (102.967 kg)  11/19/11 227 lb (102.967 kg)  10/29/11 250 lb 6.4 oz (113.581 kg)    Usual Body Weight: 232 lb  % Usual Body Weight: 89%  BMI:  Body mass index is 28.89 kg/(m^2). Overweight.  Estimated Nutritional Needs: Kcal: 2200 - 2500 Protein: 145  - 150 grams Fluid: at least 2.2 liters daily  Skin: stage IV sacrum  Diet Order: Carb Control medium  EDUCATION NEEDS: -No education needs identified at this time   Intake/Output Summary (Last 24 hours) at 10/04/12 1433 Last data filed at 10/04/12 0700  Gross per 24 hour  Intake      0 ml  Output      0 ml  Net      0 ml    Last BM: 4/20  Labs:   Recent Labs Lab 10/03/12 1533 10/04/12 0530  NA 134* 137  K 4.8 4.9  CL 99 102  CO2 27 27  BUN 41* 41*  CREATININE 1.86* 1.94*  CALCIUM 9.4 9.4  GLUCOSE 124* 102*    CBG (last 3)   Recent Labs  10/03/12 2238 10/04/12 0659 10/04/12 1055  GLUCAP 87 96 95    Scheduled Meds: . amiodarone  200 mg Oral BID  . aspirin  81 mg Oral Daily  . cefTRIAXone (ROCEPHIN)  IV  1 g Intravenous Q24H  . DULoxetine  30 mg  Oral Daily  . enoxaparin (LOVENOX) injection  90 mg Subcutaneous Q12H  . ferrous sulfate  325 mg Oral Q breakfast  . insulin aspart  0-5 Units Subcutaneous QHS  . insulin aspart  0-9 Units Subcutaneous TID WC  . polyethylene glycol  17 g Oral Daily  . saccharomyces boulardii  250 mg Oral BID  . sodium chloride  3 mL Intravenous Q12H  . [START ON 10/05/2012] vancomycin  1,000 mg Intravenous Q24H  . warfarin  7.5 mg Oral ONCE-1800  . Warfarin - Pharmacist Dosing Inpatient   Does not apply q1800    Continuous Infusions: . sodium chloride      Past Medical History  Diagnosis Date  . Atrial fibrillation   . Diabetes mellitus   . Hypertension   . Coronary artery disease   . Hyperlipemia   . ICD (implantable cardiac defibrillator) in place   .  CHF (congestive heart failure)   . Ulcer of sacral region 10/03/2012    STAGE 4 DECUBITIS  . Arthritis     KNEES & LEGS    Past Surgical History  Procedure Laterality Date  . Cholecystectomy    . Cardiac defibrillator placement  11/19/2011    single chamber  . Cardiac catheterization    . Tee without cardioversion N/A 09/14/2012    Procedure: TRANSESOPHAGEAL ECHOCARDIOGRAM (TEE);  Surgeon: Corky Crafts, MD;  Location: [facility] ENDOSCOPY;  Service: Cardiovascular;  Laterality: N/A;  Andrea/ Varanasi  . Picc line      Jarold Motto MS, RD, LDN Pager: [phone] After-hours pager: [phone]

## 2012-10-04 NOTE — Progress Notes (Addendum)
Prevalon  boot placed on rt foot.Has mattress overlay on bed.Foley care done by tech.Urine dk tea colored.

## 2012-10-04 NOTE — Progress Notes (Signed)
Antibiotic & Anticoagulant CONSULT NOTE - INITIAL  Pharmacy Consult for Vancomycin and Warfarin Indication:  Recent MRSA sepsis and new DVT right brachial vein  No Known Allergies  Patient Measurements: Body Weight: 93.3 kg Height:  180 cm  Vital Signs: Temp: 97.8 F (36.6 C) (04/22 1300) BP: 94/32 mmHg (04/22 1300) Pulse Rate: 104 (04/22 1300)  Labs:  Recent Labs  10/03/12 1533 10/04/12 0530  WBC 10.0 7.0  HGB 8.8* 8.2*  PLT 318 285  CREATININE 1.86* 1.94*   Microbiology: Recent Results (from the past 720 hour(s))  URINE CULTURE     Status: None   Collection Time    09/10/12 11:39 AM      Result Value Range Status   Specimen Description URINE, CATHETERIZED   Final   Special Requests NONE   Final   Culture  Setup Time 09/10/2012 17:34   Final   Colony Count >=100,000 COLONIES/ML   Final   Culture     Final   Value: METHICILLIN RESISTANT STAPHYLOCOCCUS AUREUS     Note: RIFAMPIN AND GENTAMICIN SHOULD NOT BE USED AS SINGLE DRUGS FOR TREATMENT OF STAPH INFECTIONS. CRITICAL RESULT CALLED TO, READ BACK BY AND VERIFIED WITH: TY H@1156  ON [id number] BY [facility]   Report Status 09/12/2012 FINAL   Final   Organism ID, Bacteria METHICILLIN RESISTANT STAPHYLOCOCCUS AUREUS   Final  MRSA PCR SCREENING     Status: None   Collection Time    09/10/12  1:49 PM      Result Value Range Status   MRSA by PCR NEGATIVE  NEGATIVE Final   Comment:            The GeneXpert MRSA Assay (FDA     approved for NASAL specimens     only), is one component of a     comprehensive MRSA colonization     surveillance program. It is not     intended to diagnose MRSA     infection nor to guide or     monitor treatment for     MRSA infections.  CULTURE, BLOOD (ROUTINE X 2)     Status: None   Collection Time    09/13/12  9:45 AM      Result Value Range Status   Specimen Description BLOOD LEFT ARM   Final   Special Requests BOTTLES DRAWN AEROBIC AND ANAEROBIC 10CC   Final   Culture  Setup Time  09/13/2012 15:32   Final   Culture NO GROWTH 5 DAYS   Final   Report Status 09/19/2012 FINAL   Final  CULTURE, BLOOD (ROUTINE X 2)     Status: None   Collection Time    09/13/12  9:55 AM      Result Value Range Status   Specimen Description BLOOD LEFT HAND   Final   Special Requests BOTTLES DRAWN AEROBIC ONLY 10CC   Final   Culture  Setup Time 09/13/2012 15:32   Final   Culture NO GROWTH 5 DAYS   Final   Report Status 09/19/2012 FINAL   Final  CLOSTRIDIUM DIFFICILE BY PCR     Status: None   Collection Time    09/16/12 10:35 AM      Result Value Range Status   C difficile by pcr NEGATIVE  NEGATIVE Final  MRSA PCR SCREENING     Status: None   Collection Time    09/17/12  1:23 AM      Result Value Range Status   MRSA by PCR  NEGATIVE  NEGATIVE Final   Comment:            The GeneXpert MRSA Assay (FDA     approved for NASAL specimens     only), is one component of a     comprehensive MRSA colonization     surveillance program. It is not     intended to diagnose MRSA     infection nor to guide or     monitor treatment for     MRSA infections.   Medical History: Past Medical History  Diagnosis Date  . Atrial fibrillation   . Diabetes mellitus   . Hypertension   . Coronary artery disease   . Hyperlipemia   . ICD (implantable cardiac defibrillator) in place   . CHF (congestive heart failure)   . Ulcer of sacral region 10/03/2012    STAGE 4 DECUBITIS  . Arthritis     KNEES & LEGS    Medications:  Anti-infectives   Start     Dose/Rate Route Frequency Ordered Stop   10/04/12 1130  vancomycin (VANCOCIN) IVPB 1000 mg/200 mL premix     1,000 mg 200 mL/hr over 60 Minutes Intravenous  Once 10/04/12 1122     10/03/12 2100  cefTRIAXone (ROCEPHIN) 1 g in dextrose 5 % 50 mL IVPB     1 g 100 mL/hr over 30 Minutes Intravenous Every 24 hours 10/03/12 1949       Assessment: 79 yr old male on vancomycin for MRSA septicemia and r/o UTI and coumadin for new mid right brachial vein  DVT.  Patient was on coumadin PTA for afib- home dose is 5mg  daily. INR today is subtherapeutic at 1.39.  Goal of Therapy:  INR 2.0 - 3.0 Vancomycin trough 15-20 mcg/ml  Plan:  Vancomycin 1000mg  IV q24hrs. Coumadin 7.5mg  po x 1 dose tonight. Start Lovenox 90mg  SQ bid. Daily PT/INR Will f/u renal func, culture data.  Wendie Simmer, PharmD, BCPS Clinical Pharmacist  Pager: [phone]

## 2012-10-04 NOTE — Consult Note (Signed)
WOC consult Note Reason for Consult:pt well known to this WOC from most recent admission.  He has large, deep Stage IV pressure ulcer to the sacrum that we initiated a VAC for, however he has since had this dc'ed at the SNF. Per the patient's wife it "was causing more trouble that good".  They have returned to using normal saline gauze dressings daily for this wound.  Wife indicated to me today that Mr. Vanetten is full comfort care, I am not sure that this has been identified in the chart. Wife also reports that the patient has been refusing to have his legs moved and this has left his heels on the bed for long periods of time. When I moved the blanket and sheet from his legs today he grimaces in pain. Wife and pt in agreement to not have me assess the sacrum today to prevent any additional pain for the patient. Left heel intact.  Wound type:new pressure ulcer right heel/DTI (deep tissue injury) Pressure Ulcer POA: Yes x 2 Sacrum Stage IV and right heel DTI Measurement:see nursing notes for sacrum, right heel: 3.5cm x 2.5cm x 0 Wound ZOX:WRUE maroon tissue with some slight bulla formation, but appears this has ruptured and drained or reabsorbed  Drainage (amount, consistency, odor) none Periwound:intact Dressing procedure/placement/frequency: will add Prevalon boot for the right heel, if pt can tolerate.  Needs air mattress since he prefers to not turn at all.  Normal saline moist to moist dressings for the sacrum, to be changed daily.    Re consult if needed, will not follow at this time. Thanks  Earnestene Angello [employer], CWOCN [phone])

## 2012-10-05 ENCOUNTER — Encounter (HOSPITAL_COMMUNITY): Payer: Self-pay

## 2012-10-05 DIAGNOSIS — I10 Essential (primary) hypertension: Secondary | ICD-10-CM

## 2012-10-05 LAB — BASIC METABOLIC PANEL
BUN: 43 mg/dL — ABNORMAL HIGH (ref 6–23)
Chloride: 104 mEq/L (ref 96–112)
GFR calc non Af Amer: 32 mL/min — ABNORMAL LOW (ref >90)
Glucose, Bld: 132 mg/dL — ABNORMAL HIGH (ref 70–99)
Potassium: 4.4 mEq/L (ref 3.5–5.1)

## 2012-10-05 LAB — GLUCOSE, CAPILLARY
Glucose-Capillary: 114 mg/dL — ABNORMAL HIGH (ref 70–99)
Glucose-Capillary: 100 mg/dL — ABNORMAL HIGH (ref 70–99)
Glucose-Capillary: 108 mg/dL — ABNORMAL HIGH (ref 70–99)

## 2012-10-05 LAB — CK: Total CK: 37 U/L (ref 7–232)

## 2012-10-05 MED ORDER — WARFARIN SODIUM 7.5 MG PO TABS
7.5000 mg | ORAL_TABLET | Freq: Once | ORAL | Status: AC
  Administered 2012-10-05: 7.5 mg via ORAL
  Filled 2012-10-05: qty 1

## 2012-10-05 MED ORDER — SODIUM CHLORIDE 0.9 % IV SOLN
580.0000 mg | INTRAVENOUS | Status: DC
  Administered 2012-10-05: 580 mg via INTRAVENOUS
  Filled 2012-10-05 (×3): qty 11.6

## 2012-10-05 NOTE — Progress Notes (Signed)
Met with patient and wife in the room.  Patient was alert and able to answer questions.  Wife reports patient may be discharged tomorrow.  Provided hospice education and anticipatory grief counseling with wife. Plan is to return to [facility] where he is a patient of their SNF and a recent admission with [facility].  Please call [facility] @ [phone] with any questions.   Laurin Coder, LCSW- [facility] Child psychotherapist

## 2012-10-05 NOTE — Progress Notes (Signed)
ANTIBIOTIC CONSULT NOTE - INITIAL  Pharmacy Consult for daptomycin, coumadin, lovenox Indication: MRSA septicemia, MRSA UTI; DVT  No Known Allergies  Patient Measurements: Height: 5' 10.87" (180 cm) Weight: 206 lb 5.6 oz (93.6 kg) IBW/kg (Calculated) : 74.99   Vital Signs: Temp: 98.1 F (36.7 C) (04/23 0520) Temp src: Oral (04/23 0520) BP: 110/58 mmHg (04/23 0520) Pulse Rate: 76 (04/23 0520) Intake/Output from previous day: 04/22 0701 - 04/23 0700 In: -  Out: 700 [Urine:700] Intake/Output from this shift:    Labs:  Recent Labs  10/03/12 1533 10/04/12 0530 10/05/12 0500  WBC 10.0 7.0  --   HGB 8.8* 8.2*  --   PLT 318 285  --   CREATININE 1.86* 1.94* 1.90*   Estimated Creatinine Clearance: 37.3 ml/min (by C-G formula based on Cr of 1.9). No results found for this basename: VANCOTROUGH, Leodis Binet, VANCORANDOM, GENTTROUGH, GENTPEAK, GENTRANDOM, TOBRATROUGH, TOBRAPEAK, TOBRARND, AMIKACINPEAK, AMIKACINTROU, AMIKACIN,  in the last 72 hours   Microbiology: Recent Results (from the past 720 hour(s))  URINE CULTURE     Status: None   Collection Time    09/10/12 11:39 AM      Result Value Range Status   Specimen Description URINE, CATHETERIZED   Final   Special Requests NONE   Final   Culture  Setup Time 09/10/2012 17:34   Final   Colony Count >=100,000 COLONIES/ML   Final   Culture     Final   Value: METHICILLIN RESISTANT STAPHYLOCOCCUS AUREUS     Note: RIFAMPIN AND GENTAMICIN SHOULD NOT BE USED AS SINGLE DRUGS FOR TREATMENT OF STAPH INFECTIONS. CRITICAL RESULT CALLED TO, READ BACK BY AND VERIFIED WITH: TY H@1156  ON [id number] BY [facility]   Report Status 09/12/2012 FINAL   Final   Organism ID, Bacteria METHICILLIN RESISTANT STAPHYLOCOCCUS AUREUS   Final  MRSA PCR SCREENING     Status: None   Collection Time    09/10/12  1:49 PM      Result Value Range Status   MRSA by PCR NEGATIVE  NEGATIVE Final   Comment:            The GeneXpert MRSA Assay (FDA     approved for NASAL  specimens     only), is one component of a     comprehensive MRSA colonization     surveillance program. It is not     intended to diagnose MRSA     infection nor to guide or     monitor treatment for     MRSA infections.  CULTURE, BLOOD (ROUTINE X 2)     Status: None   Collection Time    09/13/12  9:45 AM      Result Value Range Status   Specimen Description BLOOD LEFT ARM   Final   Special Requests BOTTLES DRAWN AEROBIC AND ANAEROBIC 10CC   Final   Culture  Setup Time 09/13/2012 15:32   Final   Culture NO GROWTH 5 DAYS   Final   Report Status 09/19/2012 FINAL   Final  CULTURE, BLOOD (ROUTINE X 2)     Status: None   Collection Time    09/13/12  9:55 AM      Result Value Range Status   Specimen Description BLOOD LEFT HAND   Final   Special Requests BOTTLES DRAWN AEROBIC ONLY 10CC   Final   Culture  Setup Time 09/13/2012 15:32   Final   Culture NO GROWTH 5 DAYS   Final   Report Status 09/19/2012  FINAL   Final  CLOSTRIDIUM DIFFICILE BY PCR     Status: None   Collection Time    09/16/12 10:35 AM      Result Value Range Status   C difficile by pcr NEGATIVE  NEGATIVE Final  MRSA PCR SCREENING     Status: None   Collection Time    09/17/12  1:23 AM      Result Value Range Status   MRSA by PCR NEGATIVE  NEGATIVE Final   Comment:            The GeneXpert MRSA Assay (FDA     approved for NASAL specimens     only), is one component of a     comprehensive MRSA colonization     surveillance program. It is not     intended to diagnose MRSA     infection nor to guide or     monitor treatment for     MRSA infections.  CULTURE, BLOOD (ROUTINE X 2)     Status: None   Collection Time    10/03/12  3:34 PM      Result Value Range Status   Specimen Description BLOOD LEFT ARM   Final   Special Requests BOTTLES DRAWN AEROBIC ONLY 10CC   Final   Culture  Setup Time 10/04/2012 00:06   Final   Culture     Final   Value:        BLOOD CULTURE RECEIVED NO GROWTH TO DATE CULTURE WILL BE HELD  FOR 5 DAYS BEFORE ISSUING A FINAL NEGATIVE REPORT   Report Status PENDING   Incomplete  URINE CULTURE     Status: None   Collection Time    10/03/12  4:17 PM      Result Value Range Status   Specimen Description URINE, CLEAN CATCH   Final   Special Requests NONE   Final   Culture  Setup Time 10/03/2012 16:44   Final   Colony Count NO GROWTH   Final   Culture NO GROWTH   Final   Report Status 10/04/2012 FINAL   Final  CULTURE, BLOOD (ROUTINE X 2)     Status: None   Collection Time    10/03/12  4:30 PM      Result Value Range Status   Specimen Description BLOOD LEFT ANTECUBITAL   Final   Special Requests BOTTLES DRAWN AEROBIC AND ANAEROBIC 10CC   Final   Culture  Setup Time 10/04/2012 00:06   Final   Culture     Final   Value:        BLOOD CULTURE RECEIVED NO GROWTH TO DATE CULTURE WILL BE HELD FOR 5 DAYS BEFORE ISSUING A FINAL NEGATIVE REPORT   Report Status PENDING   Incomplete    Medical History: Past Medical History  Diagnosis Date  . Atrial fibrillation   . Diabetes mellitus   . Hypertension   . Coronary artery disease   . Hyperlipemia   . ICD (implantable cardiac defibrillator) in place   . CHF (congestive heart failure)   . Ulcer of sacral region 10/03/2012    STAGE 4 DECUBITIS  . Arthritis     KNEES & LEGS  . Hospice care patient     Assessment: Patient is a 79 y.o M on vancomycin for MRSA septicemia/UTI.  With renal insufficiency and concern for accumulation, to change abx to daptomycin per MD request.  Scr 1.90 (est crcl~ 37).  Patient is also on anticoagulation overlap day #3  of 5 days minimum for DVT and for hx afib.  INR is subtherapeutic at 1.37.   Goal of Therapy:  INR 2-3  Plan:  1) daptomycin 580mg  IV q24h (~6mg /kg/day) 2) baseline cpk now and then weekly 3) cont lovenox 4) coumadin 7.5mg  PO x1 today -- will watch INR closely as dapto can effect INR value    Cully Luckow P 10/05/2012,10:22 AM

## 2012-10-05 NOTE — Progress Notes (Signed)
Patient ID: Troy Cook  male  ZOX:[id number]    DOB: 1931/09/13    DOA: 10/03/2012  PCP: Daisy Floro, MD  Assessment/Plan:     MRSA (methicillin resistant Staphylococcus aureus) septicemia with history of stage IV sacral decub and a pressure ulcer of the right heel.  Vancomycin was supratherapeutic and given change in kidney function will likely be hard to dose.  - Change to daptomycin and continue for 2 more weeks -  Appreciate wound care recommendations -  Family defers TEE at this time.   -  Spoke with Dr. Daiva Eves regarding plan of care -  PICC line in place left arm -  F/u Dr. Zenaida Niece dam in 2 weeks.  Weekly CPK to be sent to Dr. Clinton Gallant office  Mild AKI, likely due to dehydration and supratherapeutic vancomycin, kidney function unchanged but vancomycin level is trending down.   -  Continue gentle hydration as tolerating very little by mouth -  Change to daptomycin  -  Weekly CPK while on daptomycin  Dehydration, at risk for recurrence given poor PO intake.   -  Family aware -  Family to discuss goals:  Full palliation vs. Continuation of tx for AKI/infection and let me know later today or tomorrow morning.  Can involve palliative care if family thinks it would be helpful.    Principal Problem: Possible UTI (urinary tract infection):  Urine culture negative  Active Problems: Hypertension with hypoTN which is resolving  DVT mid right brachial vein  - cont coumadin - Bridge with lovenox, dosed by anti-Xa levels    Atrial fibrillation-  -  rate controlled on amiodarone  - On aspirin and Coumadin    Chronic systolic heart failure: - EF 25%,  monitor for volume overload   Diabetes- blood sugars well-controlled  Poor appetite: Steroids and megace note good treatments for appetite in setting of heart failure.  Denies feeling full so unclear if reglan would have much benefit either.      -  Liberalize diet -  Glucerna shakes  Normocytic anemia  DIET:   Diabetic ACCESS:  Left arm PICC, foley IVF:  KVO PROPH:  lovenox to coumadin bridge  CODE:  DNR Communication:  Spoke with patient and his wife Disposition:   Family to discuss goals of care.  Spoke with them about full palliation vs. Discharge to SNF with antibiotics after BUN:Cr start improving, but I think he would return quickly due to dehydration and poor appetite.    Subjective: Alert and awake.  Denies discomfort at this time.  Poor appetite, just does not feel hungry.  Denies nausea, vomiting.  Denies SOB, but feeling puffy today.    Objective: Weight change:   Intake/Output Summary (Last 24 hours) at 10/05/12 0944 Last data filed at 10/05/12 0519  Gross per 24 hour  Intake      0 ml  Output    700 ml  Net   -700 ml   Blood pressure 110/58, pulse 76, temperature 98.1 F (36.7 C), temperature source Oral, resp. rate 16, height 5' 10.87" (1.8 m), weight 93.6 kg (206 lb 5.6 oz), SpO2 95.00%.  Physical Exam: General: Alert and awake, not in any acute distress.  CVS: irg ireg  Chest: CTAB  Abdomen: soft NT, ND, NBS Extremities: no cyanosis, clubbing.  1+ bilateral pitting edema lower extremities left arm PICC.   Neuro: Generalized weakness  Lab Results: Basic Metabolic Panel:  Recent Labs Lab 10/04/12 0530 10/05/12 0500  NA 137  137  K 4.9 4.4  CL 102 104  CO2 27 25  GLUCOSE 102* 132*  BUN 41* 43*  CREATININE 1.94* 1.90*  CALCIUM 9.4 9.1   Liver Function Tests:  Recent Labs Lab 10/03/12 1533  AST 11  ALT 9  ALKPHOS 81  BILITOT 0.3  PROT 6.8  ALBUMIN 1.6*   CBC:  Recent Labs Lab 10/03/12 1533 10/04/12 0530  WBC 10.0 7.0  NEUTROABS 8.5*  --   HGB 8.8* 8.2*  HCT 27.5* 25.6*  MCV 86.5 86.2  PLT 318 285   CBG:  Recent Labs Lab 10/04/12 0659 10/04/12 1055 10/04/12 1559 10/04/12 2133 10/05/12 0649  GLUCAP 96 95 92 98 116*     Micro Results: Recent Results (from the past 240 hour(s))  CULTURE, BLOOD (ROUTINE X 2)     Status: None    Collection Time    10/03/12  3:34 PM      Result Value Range Status   Specimen Description BLOOD LEFT ARM   Final   Special Requests BOTTLES DRAWN AEROBIC ONLY 10CC   Final   Culture  Setup Time 10/04/2012 00:06   Final   Culture     Final   Value:        BLOOD CULTURE RECEIVED NO GROWTH TO DATE CULTURE WILL BE HELD FOR 5 DAYS BEFORE ISSUING A FINAL NEGATIVE REPORT   Report Status PENDING   Incomplete  URINE CULTURE     Status: None   Collection Time    10/03/12  4:17 PM      Result Value Range Status   Specimen Description URINE, CLEAN CATCH   Final   Special Requests NONE   Final   Culture  Setup Time 10/03/2012 16:44   Final   Colony Count NO GROWTH   Final   Culture NO GROWTH   Final   Report Status 10/04/2012 FINAL   Final  CULTURE, BLOOD (ROUTINE X 2)     Status: None   Collection Time    10/03/12  4:30 PM      Result Value Range Status   Specimen Description BLOOD LEFT ANTECUBITAL   Final   Special Requests BOTTLES DRAWN AEROBIC AND ANAEROBIC 10CC   Final   Culture  Setup Time 10/04/2012 00:06   Final   Culture     Final   Value:        BLOOD CULTURE RECEIVED NO GROWTH TO DATE CULTURE WILL BE HELD FOR 5 DAYS BEFORE ISSUING A FINAL NEGATIVE REPORT   Report Status PENDING   Incomplete    Studies/Results: Dg Chest 1 View  10/03/2012  *RADIOLOGY REPORT*  Clinical Data: Fevers  CHEST - 1 VIEW  Comparison: 09/16/2012  Findings: The pacing device and right-sided PICC line are again noted.  The lungs are clear bilaterally.  No pneumothorax or sizable effusion is seen.  Degenerative change of the thoracic spine is noted.  IMPRESSION: No acute abnormality is seen.   Original Report Authenticated By: Alcide Clever, M.D.    Ct Head Wo Contrast  10/03/2012  *RADIOLOGY REPORT*  Clinical Data: Altered level of consciousness.  CT HEAD WITHOUT CONTRAST  Technique:  Contiguous axial images were obtained from the base of the skull through the vertex without contrast.  Comparison: None.   Findings: There are lacunar infarct of the right cerebellum appears remote.  Bilateral periventricular and subcortical white matter changes are evident.  No acute cortical infarct, hemorrhage, or mass lesion is present.  The ventricles are  proportionate to the degree of atrophy.  No significant extra-axial fluid collection is present.  The paranasal sinuses and mastoid air cells are clear.  The osseous skull is intact.  No significant extracranial soft tissue injuries are evident.  IMPRESSION:  1.  No acute intracranial abnormality or significant interval change. 2.  Mild generalized atrophy likely within normal limits for age. 3.  Mild to moderate periventricular and subcortical white matter changes bilaterally, likely related to the sequelae of chronic microvascular ischemia. 4.  Remote lacunar infarct of the right cerebellum.   Original Report Authenticated By: Marin Roberts, M.D.    Dg Chest Port 1 View  10/04/2012  *RADIOLOGY REPORT*  Clinical Data: Confirm line placement  PORTABLE CHEST - 1 VIEW  Comparison: Prior chest x-ray 10/03/2012  Findings: New left upper extremity approach PICC.  The catheter tip projects over the distal SVC.  Stable position of left subclavian approach cardiac rhythm maintenance device with the lead projecting over the right ventricle.  The right upper extremity PICC has been removed.  Stable borderline cardiomegaly.  Aortic atherosclerosis is noted.  Chronic bibasilar opacities appears similar to prior. Mild vascular congestion without overt edema.  IMPRESSION:  1.  The tip of the new left upper extremity PICC projects over the distal SVC in good position. 2.  The right upper extremity PICC has been removed. 3.  Otherwise, no significant interval change in the appearance of the chest.   Original Report Authenticated By: Malachy Moan, M.D.    Dg Chest Port 1 View  09/16/2012  *RADIOLOGY REPORT*  Clinical Data: Complaint of acute dyspnea.  PORTABLE CHEST - 1 VIEW   Comparison: 09/15/2012  Findings: Stable appearance of cardiac pacemaker and right central venous catheters since previous study.  Shallow inspiration.  Heart size and pulmonary vascularity are normal for age for a effort. There is suggestion of atelectasis or infiltration in the left lung base.  No blunting of costophrenic angles.  No pneumothorax. Calcification of the aorta.  Degenerative changes in the right shoulder.  IMPRESSION: No significant change since previous study.  Shallow inspiration with atelectasis in the left lung base.   Original Report Authenticated By: Burman Nieves, M.D.    Dg Chest Port 1 View  09/15/2012  *RADIOLOGY REPORT*  Clinical Data: Confirm PICC line placement  PORTABLE CHEST - 1 VIEW  Comparison: 09/10/2012  Findings: Right arm PICC terminates at the cavoatrial junction.  Increased interstitial markings/bronchitic changes.  Mild patchy opacity at the left lung base, likely atelectasis.  No pleural effusion or pneumothorax.  Mild cardiomegaly.  Left subclavian ICD.  IMPRESSION: Right arm PICC terminates at the cavoatrial junction.   Original Report Authenticated By: Charline Bills, M.D.    Dg Abd Acute W/chest  09/10/2012  *RADIOLOGY REPORT*  Clinical Data: Abdominal pain.  Constipation.  UTI.  ACUTE ABDOMEN SERIES (ABDOMEN 2 VIEW & CHEST 1 VIEW)  Comparison: 11/20/2011  Findings: AICD unchanged in position.  Mildly low lung volumes noted.  Atherosclerotic calcification of the aortic arch is present.  There is degenerative glenohumeral arthropathy bilaterally.  No free intraperitoneal gas is observed on the left side down lateral decubitus view.  There are air-fluid levels scattered in its of dilated small bowel measuring up to 3.7 cm in diameter. Some of these air-fluid levels are probably at differing vertical heights in the same loop of small bowel.  There is a paucity of bowel gas in the pelvis, although more rectal stool is visible.  Prominent lumbar spondylosis observed.  Dextroconvex lumbar scoliosis.  IMPRESSION:  1.  Abnormal air-fluid levels in dilated small bowel.  Appearance is suspicious for small bowel obstruction. 2.  Paucity of bowel gas in the pelvis may be due to more proximal obstruction or less likely a mass in the pelvis.  However, there is formed stool in the sigmoid colon and rectum. 3.  No free intraperitoneal gas. 4.  Atherosclerosis. 5.  Lumbar spondylosis.   Original Report Authenticated By: Gaylyn Rong, M.D.     Medications: Scheduled Meds: . amiodarone  200 mg Oral BID  . aspirin  81 mg Oral Daily  . DULoxetine  30 mg Oral Daily  . enoxaparin (LOVENOX) injection  90 mg Subcutaneous Q12H  . feeding supplement  237 mL Oral BID BM  . ferrous sulfate  325 mg Oral Q breakfast  . insulin aspart  0-5 Units Subcutaneous QHS  . insulin aspart  0-9 Units Subcutaneous TID WC  . multivitamin with minerals  1 tablet Oral Daily  . polyethylene glycol  17 g Oral Daily  . saccharomyces boulardii  250 mg Oral BID  . sodium chloride  3 mL Intravenous Q12H  . vancomycin  1,000 mg Intravenous Q24H  . Warfarin - Pharmacist Dosing Inpatient   Does not apply q1800      LOS: 2 days   Renae Fickle M.D. [facility] 10/05/2012, 9:44 AM Pager: [phone]  If 7PM-7AM, please contact night-coverage [email or web address] Password [email or web address]

## 2012-10-05 NOTE — Progress Notes (Addendum)
Room 5N 01 - Troy Cook - [facility] RN Visit-R.Eltha Tingley RN  Related admission due to dehydration,weakness, borderline hypotension with a systolic blood pressure ranging from 80s to 90s, acute renal failure with a creatinine of 1.86 when previously it was 1.26 on discharge, and a very large urinary tract infection. Patient was also noted to be subtherapeutic INR 1.37-patient is on chronic Coumadin for atrial fibrillation.  Pt active with [facility] with diagnosis of CHF.   Pt is DNR  code.    Pt alert, lying in bed, denies any complaints of pain or discomfort- pt is lying completely still - almost rigid.  Wife at bedside-who has questions for Dr. Malachi Bonds - Attending;  Text paged Dr. Malachi Bonds for wife.   Wife wanted broth for pt's lunch - food service did not have on menu - NT prepared beef broth from floor stock and brought to pt.  Patient's home medication list is on shadow chart.   Please call [facility] @ [phone]- ask for RN Liaison or after hours,ask for on-call RN with any hospice needs.   Thank you.  Troy Boers, RN  [facility]  Hospice Liaison

## 2012-10-05 NOTE — Progress Notes (Signed)
Pharmacist Heart Failure Core Measure Documentation  Assessment: Troy Cook has an EF documented as 25-30% on 09/13/12 by 2D echo.  Rationale: Heart failure patients with left ventricular systolic dysfunction (LVSD) and an EF < 40% should be prescribed an angiotensin converting enzyme inhibitor (ACEI) or angiotensin receptor blocker (ARB) at discharge unless a contraindication is documented in the medical record.  This patient is not currently on an ACEI or ARB for HF.  This note is being placed in the record in order to provide documentation that a contraindication to the use of these agents is present for this encounter.  ACE Inhibitor or Angiotensin Receptor Blocker is contraindicated (specify all that apply)  []   ACEI allergy AND ARB allergy []   Angioedema []   Moderate or severe aortic stenosis []   Hyperkalemia []   Hypotension []   Renal artery stenosis [x]   Worsening renal function, preexisting renal disease or dysfunction   Lucia Gaskins 10/05/2012 11:08 AM

## 2012-10-06 LAB — BASIC METABOLIC PANEL
BUN: 42 mg/dL — ABNORMAL HIGH (ref 6–23)
Calcium: 9.1 mg/dL (ref 8.4–10.5)
GFR calc non Af Amer: 31 mL/min — ABNORMAL LOW (ref >90)
Glucose, Bld: 123 mg/dL — ABNORMAL HIGH (ref 70–99)

## 2012-10-06 LAB — CBC
MCH: 27 pg (ref 26.0–34.0)
MCHC: 31.8 g/dL (ref 30.0–36.0)
Platelets: 298 10*3/uL (ref 150–400)

## 2012-10-06 MED ORDER — WARFARIN SODIUM 5 MG PO TABS
5.0000 mg | ORAL_TABLET | Freq: Once | ORAL | Status: DC
  Filled 2012-10-06: qty 1

## 2012-10-06 MED ORDER — LORAZEPAM 0.5 MG PO TABS: 0.5000 mg | ORAL_TABLET | ORAL | Status: DC | PRN

## 2012-10-06 MED ORDER — HEPARIN SOD (PORK) LOCK FLUSH 100 UNIT/ML IV SOLN
250.0000 [IU] | INTRAVENOUS | Status: AC | PRN
  Administered 2012-10-06: 500 [IU]

## 2012-10-06 MED ORDER — LORAZEPAM 0.5 MG PO TABS: 0.5000 mg | ORAL_TABLET | ORAL | Status: AC | PRN

## 2012-10-06 MED ORDER — MORPHINE SULFATE (CONCENTRATE) 10 MG /0.5 ML PO SOLN: 5.0000 mg | ORAL | Status: DC | PRN

## 2012-10-06 MED ORDER — ONDANSETRON HCL 4 MG PO TABS: 4.0000 mg | ORAL_TABLET | Freq: Three times a day (TID) | ORAL | Status: AC | PRN

## 2012-10-06 MED ORDER — ONDANSETRON HCL 4 MG PO TABS: 4.0000 mg | ORAL_TABLET | Freq: Three times a day (TID) | ORAL | Status: DC | PRN

## 2012-10-06 MED ORDER — MORPHINE SULFATE (CONCENTRATE) 10 MG /0.5 ML PO SOLN: 5.0000 mg | ORAL | Status: AC | PRN

## 2012-10-06 NOTE — Discharge Planning (Signed)
Report called to Chelle at [facility].

## 2012-10-06 NOTE — Progress Notes (Signed)
ANTICOAGULATION CONSULT NOTE - Follow Up Consult  Pharmacy Consult for coumadin, lovenox Indication: DVT  No Known Allergies  Patient Measurements: Height: 5' 10.87" (180 cm) Weight: 206 lb 5.6 oz (93.6 kg) IBW/kg (Calculated) : 74.99   Vital Signs: Temp: 97.4 F (36.3 C) (04/23 2234) Temp src: Oral (04/23 2234) BP: 115/62 mmHg (04/23 2234) Pulse Rate: 115 (04/23 2234)  Labs:  Recent Labs  10/03/12 1533  10/04/12 0530 10/05/12 0500 10/05/12 1130 10/06/12 0439  HGB 8.8*  --  8.2*  --   --  8.3*  HCT 27.5*  --  25.6*  --   --  26.1*  PLT 318  --  285  --   --  298  LABPROT  --   < > 16.7* 16.5*  --  19.9*  INR  --   < > 1.39 1.37  --  1.76*  CREATININE 1.86*  --  1.94* 1.90*  --  1.98*  CKTOTAL  --   --   --   --  37  --   < > = values in this interval not displayed.  Estimated Creatinine Clearance: 35.8 ml/min (by C-G formula based on Cr of 1.98).  Assessment: Patient is a 79 y.o  M on anticoagulation overlap day # 4 of 5 days minimum for hx afib and new DVT with this admit.  INR is subtherapeutic but increased from 1.37 to 1.76 today .  No bleeding noted.   Goal of Therapy:  INR 2-3 Monitor platelets by anticoagulation protocol: Yes   Plan:  1) cont lovenox 2) coumadin 5mg  PO x1 today   Doc Mandala P 10/06/2012,10:21 AM

## 2012-10-06 NOTE — Progress Notes (Signed)
Pt lying in bed with wife at side.  Pt opening and closing his eyes while speaking.  He denied pain except when being turned.  Denied Dyspnea.  Per pt he really does not know if he feels any better but he feels that he is ready as he will ever be to go back to the SNF.  Spouse reports that the pt is not doing as well as she hoped but she feels he is comfortable.  Appetite reported to be poor.   Reviewed chart.  No new note available from MD at time of visit.   This is a hospice related admission.  Please Contact [facility] at [phone] with any Pt movement.  Aundria Rud, [facility] RN.  [phone]

## 2012-10-06 NOTE — Progress Notes (Signed)
Clinical Social Work Department  BRIEF PSYCHOSOCIAL ASSESSMENT  Patient: Troy Cook  Account Number: [id number]   Admit date: 10/03/12 Clinical Social Worker Sabino Niemann, MSW Date/Time: 10/04/12 Referred by: Physician Date Referred: 10/04/12 Referred for   Patient is from [facility]  Other Referral:  Interview type: Patient's wife.. Patient was asleep Other interview type: PSYCHOSOCIAL DATA  Living Status: Admitted from facility: [facility] Level of care: SNF Primary support name: Troy Cook  Primary support relationship to patient: Wife Degree of support available:  Strong and vested  CURRENT CONCERNS  Current Concerns   Post-Acute Placement   Other Concerns:  SOCIAL WORK ASSESSMENT / PLAN  CSW met with pt re: PT recommendation for SNF.   Pt is from SNF at [facility]  Wife is agreeable to patient returning. Patient's wife lives in the independent living section        Assessment/plan status: Information/Referral to [facility]  Other assessment/ plan:  Information/referral to community resources:  SNF   PTAR  PATIENT'S/FAMILY'S RESPONSE TO PLAN OF CARE:  Pt's wife  reports she is agreeable to returning to [facility] SNF in order to increase strength and independence with mobility prior to returning home  Pt verbalized understanding of placement process and appreciation for CSW assist.   Sabino Niemann, MSW [phone]

## 2012-10-06 NOTE — Progress Notes (Signed)
Clinical social worker assisted with patient discharge to skilled nursing facility, [facility].  CSW addressed all family questions and concerns. CSW copied chart and added all important documents. CSW also set up patient transportation with [employer]. Clinical Social Worker will sign off for now as social work intervention is no longer needed.  Sabino Niemann, MSW,  [phone]

## 2012-10-06 NOTE — Discharge Summary (Signed)
Physician Discharge Summary  Troy Cook ZOX:[id number] DOB: 09/08/1931 DOA: 10/03/2012  PCP: Daisy Floro, MD  Admit date: 10/03/2012 Discharge date: 10/06/2012  Recommendations for Outpatient Follow-up:  1. Return to skilled nursing for hospice care, palliative treatments only.  Declines any medications, including antibiotics, that are not directly related to patient comfort.  No painful procedures, including fingersticks.   2. Patient and wife prefer not to return to the hospital.   3. Agreed to leave PICC in for now because they have not made a firm decision about the desire for IV fluids, but may be discontinued at a later time if needed.    Discharge Diagnoses:  Principal Problem:   UTI (urinary tract infection) Active Problems:   Hypertension   Dyslipidemia   Atrial fibrillation   Chronic systolic heart failure   Pressure ulcer stage IV   MRSA (methicillin resistant Staphylococcus aureus) septicemia   Diabetes   Pressure ulcer of heel   Depression   Discharge Condition: stable, fair  ACCESS:  PICC line left arm, Foley to remain in place for comfort.   Wound care:  Normal saline moist to moist dressings for the sacrum, to be changed daily.  Continue prevalon boot to right foot.     Diet recommendation: regular  Wt Readings from Last 3 Encounters:  10/04/12 93.6 kg (206 lb 5.6 oz)  09/21/12 93.6 kg (206 lb 5.6 oz)  09/21/12 93.6 kg (206 lb 5.6 oz)    History of present illness:   Troy Cook is a 79 y.o. male  With past medical history of atrial fibrillation, stage IV decubitus ulcer which is felt to contribute to a MRSA septicemia who was just discharged from the hospitalist service approximately 11 days ago to a skilled nursing facility on IV vancomycin through a PICC line. Patient was brought in to the emergency room today and was noted that the PICC line had clotted off and needed a PICC line exchange. However, patient looked to be quite dehydrated and  weak. Evaluation by emergency room physicians noted borderline hypotension with a systolic blood pressure ranging from 80s to 90s, acute renal failure with a creatinine of 1.86 when previously it was 1.26 on discharge, and a very large urinary tract infection. Patient was also noted to be subtherapeutic with an hour 1.37-patient is on chronic Coumadin for atrial fibrillation. Given his multiple issues with an underlying chronic severe illness a stage IV decubitus ulcer with IV vancomycin therapy, it was felt best that patient come in the hospital for further treatment. Hospitalists were called for admission.    Hospital Course:   MRSA (methicillin resistant Staphylococcus aureus) septicemia with history of stage IV sacral decub and a pressure ulcer of the right heel. Vancomycin was supratherapeutic at admission.  IT was held and the vancomycin level trended down.  He was transitioned to daptomycin for ease of dosing and prevention of further injury in the setting of AKI.  His PICC line was exchanged.  He was seen by wound care who recommended continued wet to dry dressing changes of his sacral decubitus ulcer.  Please make sure he receives morphine prior to turns and dressing changes.   Continue prevalon boot right foot.  The family declined TEE.  Troy Cook ate and drank very little, making it unlikely that he would be able to heal his ulcer and recover. After further conversation, they decided to decline further antibiotics and transition goals of care to comfort.  They recommended cessation of antibiotics.  Mild AKI, likely due to dehydration and supratherapeutic vancomycin.  He was given gentle hydration.  BUN and creatinine remained essentially unchanged, but vancomycin level did trend down.  No further blood draws at request of family.    Dehydration, at risk for recurrence given poor PO intake.  The wife and patient remained undecided about their desire for IVF in the setting of dehydration so we  agreed to leave the PICC line in for now.    Possible UTI (urinary tract infection): Urine culture negative   Hypertension with hypoTN, resolved.    DVT mid right brachial vein.  He was restarted on lovenox and coumadin.  His INR at the time of discharge was 1.76, however, both lovenox and coumadin were discontinued.    Atrial fibrillation- rate controlled on amiodarone.  Amiodarone discontinued - has a long half life.  Aspirin and coumadin also discontinued.    Chronic systolic heart failure:  EF 25%, has some lower extremity edema.  Diabetes- blood sugars well-controlled and has received no insulin since admission.  D/C fingersticks and insulin.    Poor appetite:  Steroids and megace note good treatments for appetite in setting of heart failure. Denies feeling full so unclear if reglan would have much benefit either.  Liberalized diet.  May continue glucerna shakes if he prefers.    Normocytic anemia, no further work up at this time.  No further blood draws per family request.     Foley to remain in for comfort  Procedures:  CT head  CXR  Consultations:  Orthopedics  Discharge Exam: Filed Vitals:   10/05/12 2234  BP: 115/62  Pulse: 115  Temp: 97.4 F (36.3 C)  Resp: 16   Filed Vitals:   10/04/12 1945 10/05/12 0520 10/05/12 1300 10/05/12 2234  BP: 104/58 110/58 114/59 115/62  Pulse: 80 76 94 115  Temp: 97.4 F (36.3 C) 98.1 F (36.7 C) 97.4 F (36.3 C) 97.4 F (36.3 C)  TempSrc: Oral Oral  Oral  Resp: 16 16 16 16   Height:      Weight:      SpO2: 96% 95% 98% 98%   Patient declined medications this morning.  States he is tired of taking medications.  Wife and patient spoke at length with hospice RN, today's RN, and myself regarding goals of care.    General: Alert and awake, not in any acute distress.  CVS: irg ireg, no mrg Chest: CTAB  Abdomen: soft NT, ND, NBS  Extremities: no cyanosis, clubbing. 2+ bilateral pitting edema lower extremities left arm PICC.   Foley in place Neuro: Generalized weakness   Discharge Instructions      Discharge Orders   Future Orders Complete By Expires     (HEART FAILURE PATIENTS) Call MD:  Anytime you have any of the following symptoms: 1) 3 pound weight gain in 24 hours or 5 pounds in 1 week 2) shortness of breath, with or without a dry hacking cough 3) swelling in the hands, feet or stomach 4) if you have to sleep on extra pillows at night in order to breathe.  As directed     Call MD for:  difficulty breathing, headache or visual disturbances  As directed     Call MD for:  extreme fatigue  As directed     Call MD for:  hives  As directed     Call MD for:  persistant dizziness or light-headedness  As directed     Call MD for:  persistant nausea  and vomiting  As directed     Call MD for:  severe uncontrolled pain  As directed     Call MD for:  temperature >100.4  As directed     Diet general  As directed     Discharge instructions  As directed     Comments:      Mr. Margraf was admitted with subtherapeutic INR and a blood clot in the right arm from his PICC line.  His PICC line was moved to his left arm and he was restarted on anticoagulation.  He had some decrease in his kidney function that may have been due to dehydration or vancomycin.  His vancomycin was changed to daptomycin and he was hydrated, however, his kidney function remained unchanged.  He did not eat very well and eventually declined his medications.  After conversation about goals of care, he will be made as comfortable as possible and will not need to come back to the hospital for aggressive care.  His non-comfort related oral medications and antibiotics were discontinued.  He may continue his PICC line and his foley for comfort.  He will return to skilled nursing for palliative care.    Increase activity slowly  As directed         Medication List    STOP taking these medications       amiodarone 200 MG tablet  Commonly known as:  PACERONE      aspirin 81 MG chewable tablet     DULoxetine 30 MG capsule  Commonly known as:  CYMBALTA     ferrous sulfate 325 (65 FE) MG tablet     FLORASTOR 250 MG capsule  Generic drug:  saccharomyces boulardii     insulin aspart 100 UNIT/ML injection  Commonly known as:  novoLOG     vancomycin 1 GM/200ML Soln  Commonly known as:  VANCOCIN     warfarin 5 MG tablet  Commonly known as:  COUMADIN      TAKE these medications       loperamide 2 MG capsule  Commonly known as:  IMODIUM  Take 1 capsule (2 mg total) by mouth 4 (four) times daily as needed for diarrhea or loose stools.     LORazepam 0.5 MG tablet  Commonly known as:  ATIVAN  Take 1 tablet (0.5 mg total) by mouth every 4 (four) hours as needed.     morphine CONCENTRATE 10 mg / 0.5 ml concentrated solution  Take 0.25-0.5 mLs (5-10 mg total) by mouth every 2 (two) hours as needed for pain (or shortness of breath).     ondansetron 4 MG tablet  Commonly known as:  ZOFRAN  Take 1 tablet (4 mg total) by mouth every 8 (eight) hours as needed.     polyethylene glycol packet  Commonly known as:  MIRALAX / GLYCOLAX  Take 17 g by mouth daily.     RESOURCE PO  Take 90 mLs by mouth 2 (two) times daily.          The results of significant diagnostics from this hospitalization (including imaging, microbiology, ancillary and laboratory) are listed below for reference.    Significant Diagnostic Studies: Dg Chest 1 View  10/03/2012  *RADIOLOGY REPORT*  Clinical Data: Fevers  CHEST - 1 VIEW  Comparison: 09/16/2012  Findings: The pacing device and right-sided PICC line are again noted.  The lungs are clear bilaterally.  No pneumothorax or sizable effusion is seen.  Degenerative change of the thoracic spine is noted.  IMPRESSION: No acute abnormality is seen.   Original Report Authenticated By: Alcide Clever, M.D.    Ct Head Wo Contrast  10/03/2012  *RADIOLOGY REPORT*  Clinical Data: Altered level of consciousness.  CT HEAD WITHOUT  CONTRAST  Technique:  Contiguous axial images were obtained from the base of the skull through the vertex without contrast.  Comparison: None.  Findings: There are lacunar infarct of the right cerebellum appears remote.  Bilateral periventricular and subcortical white matter changes are evident.  No acute cortical infarct, hemorrhage, or mass lesion is present.  The ventricles are proportionate to the degree of atrophy.  No significant extra-axial fluid collection is present.  The paranasal sinuses and mastoid air cells are clear.  The osseous skull is intact.  No significant extracranial soft tissue injuries are evident.  IMPRESSION:  1.  No acute intracranial abnormality or significant interval change. 2.  Mild generalized atrophy likely within normal limits for age. 3.  Mild to moderate periventricular and subcortical white matter changes bilaterally, likely related to the sequelae of chronic microvascular ischemia. 4.  Remote lacunar infarct of the right cerebellum.   Original Report Authenticated By: Marin Roberts, M.D.    Dg Chest Port 1 View  10/04/2012  *RADIOLOGY REPORT*  Clinical Data: Confirm line placement  PORTABLE CHEST - 1 VIEW  Comparison: Prior chest x-ray 10/03/2012  Findings: New left upper extremity approach PICC.  The catheter tip projects over the distal SVC.  Stable position of left subclavian approach cardiac rhythm maintenance device with the lead projecting over the right ventricle.  The right upper extremity PICC has been removed.  Stable borderline cardiomegaly.  Aortic atherosclerosis is noted.  Chronic bibasilar opacities appears similar to prior. Mild vascular congestion without overt edema.  IMPRESSION:  1.  The tip of the new left upper extremity PICC projects over the distal SVC in good position. 2.  The right upper extremity PICC has been removed. 3.  Otherwise, no significant interval change in the appearance of the chest.   Original Report Authenticated By: Malachy Moan, M.D.    Dg Chest Port 1 View  09/16/2012  *RADIOLOGY REPORT*  Clinical Data: Complaint of acute dyspnea.  PORTABLE CHEST - 1 VIEW  Comparison: 09/15/2012  Findings: Stable appearance of cardiac pacemaker and right central venous catheters since previous study.  Shallow inspiration.  Heart size and pulmonary vascularity are normal for age for a effort. There is suggestion of atelectasis or infiltration in the left lung base.  No blunting of costophrenic angles.  No pneumothorax. Calcification of the aorta.  Degenerative changes in the right shoulder.  IMPRESSION: No significant change since previous study.  Shallow inspiration with atelectasis in the left lung base.   Original Report Authenticated By: Burman Nieves, M.D.    Dg Chest Port 1 View  09/15/2012  *RADIOLOGY REPORT*  Clinical Data: Confirm PICC line placement  PORTABLE CHEST - 1 VIEW  Comparison: 09/10/2012  Findings: Right arm PICC terminates at the cavoatrial junction.  Increased interstitial markings/bronchitic changes.  Mild patchy opacity at the left lung base, likely atelectasis.  No pleural effusion or pneumothorax.  Mild cardiomegaly.  Left subclavian ICD.  IMPRESSION: Right arm PICC terminates at the cavoatrial junction.   Original Report Authenticated By: Charline Bills, M.D.    Dg Abd Acute W/chest  09/10/2012  *RADIOLOGY REPORT*  Clinical Data: Abdominal pain.  Constipation.  UTI.  ACUTE ABDOMEN SERIES (ABDOMEN 2 VIEW & CHEST 1 VIEW)  Comparison: 11/20/2011  Findings: AICD unchanged  in position.  Mildly low lung volumes noted.  Atherosclerotic calcification of the aortic arch is present.  There is degenerative glenohumeral arthropathy bilaterally.  No free intraperitoneal gas is observed on the left side down lateral decubitus view.  There are air-fluid levels scattered in its of dilated small bowel measuring up to 3.7 cm in diameter. Some of these air-fluid levels are probably at differing vertical heights in the same  loop of small bowel.  There is a paucity of bowel gas in the pelvis, although more rectal stool is visible.  Prominent lumbar spondylosis observed.  Dextroconvex lumbar scoliosis.  IMPRESSION:  1.  Abnormal air-fluid levels in dilated small bowel.  Appearance is suspicious for small bowel obstruction. 2.  Paucity of bowel gas in the pelvis may be due to more proximal obstruction or less likely a mass in the pelvis.  However, there is formed stool in the sigmoid colon and rectum. 3.  No free intraperitoneal gas. 4.  Atherosclerosis. 5.  Lumbar spondylosis.   Original Report Authenticated By: Gaylyn Rong, M.D.     Microbiology: Recent Results (from the past 240 hour(s))  CULTURE, BLOOD (ROUTINE X 2)     Status: None   Collection Time    10/03/12  3:34 PM      Result Value Range Status   Specimen Description BLOOD LEFT ARM   Final   Special Requests BOTTLES DRAWN AEROBIC ONLY 10CC   Final   Culture  Setup Time 10/04/2012 00:06   Final   Culture     Final   Value:        BLOOD CULTURE RECEIVED NO GROWTH TO DATE CULTURE WILL BE HELD FOR 5 DAYS BEFORE ISSUING A FINAL NEGATIVE REPORT   Report Status PENDING   Incomplete  URINE CULTURE     Status: None   Collection Time    10/03/12  4:17 PM      Result Value Range Status   Specimen Description URINE, CLEAN CATCH   Final   Special Requests NONE   Final   Culture  Setup Time 10/03/2012 16:44   Final   Colony Count NO GROWTH   Final   Culture NO GROWTH   Final   Report Status 10/04/2012 FINAL   Final  CULTURE, BLOOD (ROUTINE X 2)     Status: None   Collection Time    10/03/12  4:30 PM      Result Value Range Status   Specimen Description BLOOD LEFT ANTECUBITAL   Final   Special Requests BOTTLES DRAWN AEROBIC AND ANAEROBIC 10CC   Final   Culture  Setup Time 10/04/2012 00:06   Final   Culture     Final   Value:        BLOOD CULTURE RECEIVED NO GROWTH TO DATE CULTURE WILL BE HELD FOR 5 DAYS BEFORE ISSUING A FINAL NEGATIVE REPORT   Report  Status PENDING   Incomplete     Labs: Basic Metabolic Panel:  Recent Labs Lab 10/03/12 1533 10/04/12 0530 10/05/12 0500 10/06/12 0439  NA 134* 137 137 137  K 4.8 4.9 4.4 4.5  CL 99 102 104 103  CO2 27 27 25 25   GLUCOSE 124* 102* 132* 123*  BUN 41* 41* 43* 42*  CREATININE 1.86* 1.94* 1.90* 1.98*  CALCIUM 9.4 9.4 9.1 9.1   Liver Function Tests:  Recent Labs Lab 10/03/12 1533  AST 11  ALT 9  ALKPHOS 81  BILITOT 0.3  PROT 6.8  ALBUMIN 1.6*  No results found for this basename: LIPASE, AMYLASE,  in the last 168 hours No results found for this basename: AMMONIA,  in the last 168 hours CBC:  Recent Labs Lab 10/03/12 1533 10/04/12 0530 10/06/12 0439  WBC 10.0 7.0 6.6  NEUTROABS 8.5*  --   --   HGB 8.8* 8.2* 8.3*  HCT 27.5* 25.6* 26.1*  MCV 86.5 86.2 85.0  PLT 318 285 298   Cardiac Enzymes:  Recent Labs Lab 10/05/12 1130  CKTOTAL 37   BNP: BNP (last 3 results)  Recent Labs  10/26/11 1440  PROBNP 2287.0*   CBG:  Recent Labs Lab 10/05/12 1059 10/05/12 1557 10/05/12 2212 10/06/12 0828 10/06/12 1123  GLUCAP 114* 100* 108* 124* 110*    Time coordinating discharge: 45 minutes  Signed:  Jerrine Urschel  [facility] 10/06/2012, 12:38 PM

## 2012-10-10 ENCOUNTER — Non-Acute Institutional Stay (SKILLED_NURSING_FACILITY): Payer: Medicare Other | Admitting: Nurse Practitioner

## 2012-10-10 ENCOUNTER — Encounter: Payer: Self-pay | Admitting: Nurse Practitioner

## 2012-10-10 DIAGNOSIS — L8994 Pressure ulcer of unspecified site, stage 4: Secondary | ICD-10-CM

## 2012-10-10 DIAGNOSIS — G894 Chronic pain syndrome: Secondary | ICD-10-CM

## 2012-10-10 DIAGNOSIS — R5381 Other malaise: Secondary | ICD-10-CM

## 2012-10-10 DIAGNOSIS — L8990 Pressure ulcer of unspecified site, unspecified stage: Secondary | ICD-10-CM

## 2012-10-10 DIAGNOSIS — Z66 Do not resuscitate: Secondary | ICD-10-CM | POA: Insufficient documentation

## 2012-10-10 LAB — CULTURE, BLOOD (ROUTINE X 2): Culture: NO GROWTH

## 2012-10-10 NOTE — Progress Notes (Signed)
Patient ID: Troy Cook, male   DOB: June 06, 1932, 79 y.o.   MRN: [id number] Code Status: DNR, Hospice Chief Complaint  Patient presents with  . Medical Managment of Chronic Issues    end of life care     No Known Allergies  Chief Complaint  Patient presents with  . Medical Managment of Chronic Issues    end of life care    HPI: Patient is a 79 y.o. male seen in SNF at [facility] today for end of life care issue, chronic pain, sacral wound, constipation.          The patient was admitted to Hospice Service prior to this hospitalization. He was readmitted to hospital due to his malfunctioned PICC for Vancomycin at that time. During hospital stay the patient was initially continued ABT with Daptomycin after PICC exchanged for MRSA, IVF to correct dehydration, regulated Coumadin since it was sub therapeutic The he.and his POA his wife decided to return to SNF at [facility], Palliative treatment only, declined any medications including antibiotics, that are not directly related to patient's comfort. The patient and his wife prefer not to return to hospital.   Other chronic medical issues are: A-fib, stage IV decubitus which is felt to contribute to a MRSA septicemia--treated with Vanco, acute on chronic renal disease, HTN, DVT right brachial vein, CHF with EF 25%, type II diabetes.  Problem List Items Addressed This Visit     ICD-9-CM   Pressure ulcer stage IV     Sacral region: stage IV-Santyl and wet to moist dressing and packing. Continue Foley for incontinent of urine due to sacral wound and chronic pain with body movement.     Chronic pain syndrome     Aches allover, Morphine 5mg  q2hr prn available to him.     Debility     Comfort measures only.     DNR (do not resuscitate) - Primary     Also Hospice care, declined all medications except Morphine, MiraLax, Ativan, Zofran, and Loperamide for comfort measures.         Review of Systems:  Review of Systems  Constitutional:  Positive for weight loss and malaise/fatigue. Negative for fever, chills and diaphoresis.  HENT: Positive for hearing loss and neck pain. Negative for ear pain, congestion and sore throat.   Eyes: Negative for pain, discharge and redness.  Respiratory: Positive for shortness of breath. Negative for cough, sputum production and wheezing.   Cardiovascular: Positive for leg swelling and PND. Negative for chest pain, palpitations, orthopnea and claudication.  Gastrointestinal: Negative for heartburn, nausea, vomiting, abdominal pain, diarrhea, constipation and blood in stool.  Genitourinary: Positive for frequency (Foley ) and hematuria. Negative for dysuria, urgency and flank pain.  Musculoskeletal: Positive for myalgias, back pain and joint pain.  Skin: Negative for itching and rash.       Sacral wound stage IV and right medial heel-new unable to stage-3x4cm dark red/black intact area.   Neurological: Positive for weakness (generalized). Negative for dizziness, tingling, tremors, sensory change, speech change, focal weakness, seizures and loss of consciousness.  Endo/Heme/Allergies: Negative for environmental allergies and polydipsia. Does not bruise/bleed easily.  Psychiatric/Behavioral: Positive for depression and memory loss. Negative for suicidal ideas and hallucinations. The patient is nervous/anxious. The patient does not have insomnia.      Past Medical History  Diagnosis Date  . Atrial fibrillation   . Diabetes mellitus   . Hypertension   . Coronary artery disease   . Hyperlipemia   .  ICD (implantable cardiac defibrillator) in place   . CHF (congestive heart failure)   . Ulcer of sacral region 10/03/2012    STAGE 4 DECUBITIS  . Arthritis     KNEES & LEGS  . Hospice care patient    Past Surgical History  Procedure Laterality Date  . Cholecystectomy    . Cardiac defibrillator placement  11/19/2011    single chamber  . Cardiac catheterization    . Tee without cardioversion N/A  09/14/2012    Procedure: TRANSESOPHAGEAL ECHOCARDIOGRAM (TEE);  Surgeon: Corky Crafts, MD;  Location: [facility] ENDOSCOPY;  Service: Cardiovascular;  Laterality: N/A;  Andrea/ Varanasi  . Picc line     Social History:   reports that he quit smoking about 11 years ago. He has never used smokeless tobacco. He reports that he does not drink alcohol or use illicit drugs.  No family history on file.  Medications: Patient's Medications  New Prescriptions   No medications on file  Previous Medications   LOPERAMIDE (IMODIUM) 2 MG CAPSULE    Take 1 capsule (2 mg total) by mouth 4 (four) times daily as needed for diarrhea or loose stools.   LORAZEPAM (ATIVAN) 0.5 MG TABLET    Take 1 tablet (0.5 mg total) by mouth every 4 (four) hours as needed.   MORPHINE SULFATE (MORPHINE CONCENTRATE) 10 MG / 0.5 ML CONCENTRATED SOLUTION    Take 0.25-0.5 mLs (5-10 mg total) by mouth every 2 (two) hours as needed for pain (or shortness of breath).   NUTRITIONAL SUPPLEMENTS (RESOURCE PO)    Take 90 mLs by mouth 2 (two) times daily.   ONDANSETRON (ZOFRAN) 4 MG TABLET    Take 1 tablet (4 mg total) by mouth every 8 (eight) hours as needed.   POLYETHYLENE GLYCOL (MIRALAX / GLYCOLAX) PACKET    Take 17 g by mouth daily.  Modified Medications   No medications on file  Discontinued Medications   No medications on file     Physical Exam: Physical Exam  Constitutional: He appears well-developed and well-nourished.  HENT:  Head: Normocephalic and atraumatic.  Eyes: Conjunctivae and EOM are normal. Pupils are equal, round, and reactive to light.  Neck: Normal range of motion. Neck supple. No JVD present. No thyromegaly present.  Cardiovascular: Normal rate and intact distal pulses.  An irregular rhythm present.  Murmur heard. Pulmonary/Chest: Effort normal and breath sounds normal. He has no wheezes. He has no rales.  Abdominal: Soft. Bowel sounds are normal.  Genitourinary: Rectum normal.  Musculoskeletal:        Right shoulder: He exhibits decreased range of motion.       Left shoulder: He exhibits decreased range of motion and tenderness.       Right knee: Tenderness found.       Left knee: Tenderness found.  Lymphadenopathy:    He has no cervical adenopathy.  Skin:     Pressure ulcer, stage IV, wound vac and Santyl, Flexseal in place for diarrhea. Pressure wound bed is filled with granulated tissue.  New pressure area is intact with dark red/black medial right heel.   Psychiatric: His mood appears anxious. His affect is angry. His affect is not blunt, not labile and not inappropriate. His speech is not rapid and/or pressured, not delayed, not tangential and not slurred. He is agitated. He is not aggressive, not hyperactive, not slowed, not withdrawn and not combative. Thought content is not paranoid and not delusional. Cognition and memory are impaired. He expresses impulsivity. He does  not express inappropriate judgment. He exhibits a depressed mood. He exhibits abnormal recent memory.       Labs reviewed: Basic Metabolic Panel:  Recent Labs  16/10/96 1440 10/26/11 2047  09/16/12 2330 09/17/12 0117  10/04/12 0530 10/05/12 0500 10/06/12 0439  NA 137  --   < > 133*  --   < > 137 137 137  K 4.5  --   < > 3.1*  --   < > 4.9 4.4 4.5  CL 101  --   < > 102  --   < > 102 104 103  CO2 29  --   < > 22  --   < > 27 25 25   GLUCOSE 124*  --   < > 348*  --   < > 102* 132* 123*  BUN 21  --   < > 25*  --   < > 41* 43* 42*  CREATININE 0.59  --   < > 0.57  --   < > 1.94* 1.90* 1.98*  CALCIUM 8.7  --   < > 7.7*  --   < > 9.4 9.1 9.1  MG  --   --   --   --  1.5  --   --   --   --   TSH  --  1.544  --   --   --   --   --   --   --   < > = values in this interval not displayed. Liver Function Tests:  Recent Labs  10/27/11 0345 09/10/12 1004 10/03/12 1533  AST 13 8 11   ALT 10 9 9   ALKPHOS 55 92 81  BILITOT 0.8 0.3 0.3  PROT 6.9 7.7 6.8  ALBUMIN 3.2* 2.1* 1.6*    Recent Labs   09/10/12 1004  LIPASE 15   No results found for this basename: AMMONIA,  in the last 8760 hours CBC:  Recent Labs  11/12/11 1227 09/10/12 1004  10/03/12 1533 10/04/12 0530 10/06/12 0439  WBC 9.4 45.4*  < > 10.0 7.0 6.6  NEUTROABS 6.7 43.6*  --  8.5*  --   --   HGB 10.8* 11.6*  < > 8.8* 8.2* 8.3*  HCT 33.7* 33.4*  < > 27.5* 25.6* 26.1*  MCV 91.1 81.3  < > 86.5 86.2 85.0  PLT 259.0 427*  < > 318 285 298  < > = values in this interval not displayed. Lipid Panel: No results found for this basename: CHOL, HDL, LDLCALC, TRIG, CHOLHDL, LDLDIRECT,  in the last 8760 hours Anemia Panel: No results found for this basename: FOLATE, IRON, VITAMINB12,  in the last 8760 hours  Past Procedures:  09/10/12 X-ray abd  IMPRESSION:   1.  Abnormal air-fluid levels in dilated small bowel.  Appearance is suspicious for small bowel obstruction. 2.  Paucity of bowel gas in the pelvis may be due to more proximal obstruction or less likely a mass in the pelvis.  However, there is formed stool in the sigmoid colon and rectum. 3.  No free intraperitoneal gas. 4.  Atherosclerosis. 5.  Lumbar spondylosis.  09/15/12 X-ray chest  IMPRESSION: Right arm PICC terminates at the cavoatrial junction.  09/16/12 CXR  IMPRESSION: No significant change since previous study.  Shallow inspiration with atelectasis in the left lung base.  10/03/12 CXR  IMPRESSION: No acute abnormality is seen.  10/03/12 CT head w/o CM  IMPRESSION:   1.  No acute intracranial abnormality or significant interval change.  2.  Mild generalized atrophy likely within normal limits for age. 3.  Mild to moderate periventricular and subcortical white matter changes bilaterally, likely related to the sequelae of chronic microvascular ischemia. 4.  Remote lacunar infarct of the right cerebellum.  10/04/12 CXR  IMPRESSION:   1.  The tip of the new left upper extremity PICC projects over the distal SVC in good position. 2.  The  right upper extremity PICC has been removed. 3.  Otherwise, no significant interval change in the appearance of the chest. Summary:  - Technically difficult due to moderate interstitial   throughout the entire right leg and from the popliteal   fossa to the ankle on the left. - No obvious evidence of deep vein thrombosis involving the   visualized veins of the right lower extremity. - No obvious evidence of deep vein thrombosis involving the   visualized veins of the left lower extremity. - No obvious evidence of superficial thrombus of the right   or left lower extremity - No evidence of Baker's cyst on the right or left. Other specific details can be found in the table(s) above.    Prepared and Electronically Authenticated by  10/26/11 CT angio chest  IMPRESSION:   1.  Negative for pulmonary embolism.   2.  Overall findings compatible with pulmonary edema with small bilateral effusions and bibasilar opacities, right greater than left, atelectasis versus infiltrate.   3.  Apparent enlargement of the right atrium and main pulmonary artery, nonspecific though may be seen in the setting of pulmonary arterial hypertension.  Further evaluation with cardiac echo may be performed as clinically indicated.   4. Nonspecific mediastinal lymphadenopathy, possibly reactive in etiology.   Assessment/Plan DNR (do not resuscitate) Also Hospice care, declined all medications except Morphine, MiraLax, Ativan, Zofran, and Loperamide for comfort measures.    Debility Comfort measures only.   Chronic pain syndrome Aches allover, Morphine 5mg  q2hr prn available to him.   Pressure ulcer stage IV Sacral region: stage IV-Santyl and wet to moist dressing and packing. Continue Foley for incontinent of urine due to sacral wound and chronic pain with body movement.     Family/ Staff Communication:  Comfort measures.   Goals of Care: palliative  Labs/tests ordered: none.

## 2012-10-10 NOTE — Assessment & Plan Note (Addendum)
Sacral region: stage IV-Santyl and wet to moist dressing and packing. Continue Foley for incontinent of urine due to sacral wound and chronic pain with body movement.

## 2012-10-10 NOTE — Assessment & Plan Note (Signed)
Also Hospice care, declined all medications except Morphine, MiraLax, Ativan, Zofran, and Loperamide for comfort measures.

## 2012-10-10 NOTE — Assessment & Plan Note (Signed)
Comfort measures only

## 2012-10-10 NOTE — Assessment & Plan Note (Signed)
Aches allover, Morphine 5mg  q2hr prn available to him.

## 2012-10-11 LAB — BASIC METABOLIC PANEL
Creatinine: 2.6 mg/dL — AB (ref 0.6–1.3)
Potassium: 5.6 mmol/L — AB (ref 3.4–5.3)
Sodium: 141 mmol/L (ref 137–147)

## 2012-10-11 LAB — CBC AND DIFFERENTIAL: Hemoglobin: 8.5 g/dL — AB (ref 13.5–17.5)

## 2012-10-12 ENCOUNTER — Non-Acute Institutional Stay (SKILLED_NURSING_FACILITY): Payer: Medicare Other | Admitting: Nurse Practitioner

## 2012-10-12 DIAGNOSIS — F329 Major depressive disorder, single episode, unspecified: Secondary | ICD-10-CM

## 2012-10-12 DIAGNOSIS — Z95810 Presence of automatic (implantable) cardiac defibrillator: Secondary | ICD-10-CM

## 2012-10-12 DIAGNOSIS — D649 Anemia, unspecified: Secondary | ICD-10-CM

## 2012-10-12 DIAGNOSIS — Z5189 Encounter for other specified aftercare: Secondary | ICD-10-CM

## 2012-10-12 DIAGNOSIS — R319 Hematuria, unspecified: Secondary | ICD-10-CM | POA: Insufficient documentation

## 2012-10-12 DIAGNOSIS — N179 Acute kidney failure, unspecified: Secondary | ICD-10-CM

## 2012-10-12 DIAGNOSIS — G894 Chronic pain syndrome: Secondary | ICD-10-CM

## 2012-10-12 DIAGNOSIS — I5022 Chronic systolic (congestive) heart failure: Secondary | ICD-10-CM

## 2012-10-12 DIAGNOSIS — L8990 Pressure ulcer of unspecified site, unspecified stage: Secondary | ICD-10-CM

## 2012-10-12 DIAGNOSIS — L8994 Pressure ulcer of unspecified site, stage 4: Secondary | ICD-10-CM

## 2012-10-12 NOTE — Progress Notes (Signed)
Patient ID: Troy Cook, male   DOB: Nov 01, 1931, 79 y.o.   MRN: [id number]  Chief Complaint: No chief complaint on file.    HPI:   Problem List Items Addressed This Visit     ICD-9-CM   Normocytic anemia - Primary     Hgb 8.5 10/11/12, hematuria recurred this am.     ICD (implantable cardiac defibrillator) in place     Rate controlled    Chronic systolic heart failure     Still compensated with occasional hacking cough, but bibasilar rales remains as prior.     Pressure ulcer stage IV     Non change    AKI (acute kidney injury)     Related to poor oral intakes and fluid depletion--IVF 1/2NS 2 liters for now. Bun/creat 43/2.63 and K 5.6 10/11/12--f/u BMP pending upon completion of IVF    ICD (implantable cardiac defibrillator) infection     Has been turned off    Depression     Cymbalta seems help.     Chronic pain syndrome     Morphine is available to him    Hematuria      Review of Systems:  Review of Systems  Constitutional: Positive for weight loss and malaise/fatigue. Negative for fever, chills and diaphoresis.  HENT: Positive for hearing loss and neck pain. Negative for ear pain, congestion and sore throat.   Eyes: Negative for pain, discharge and redness.  Respiratory: Positive for shortness of breath. Negative for cough, sputum production and wheezing.   Cardiovascular: Positive for leg swelling and PND. Negative for chest pain, palpitations, orthopnea and claudication.  Gastrointestinal: Negative for heartburn, nausea, vomiting, abdominal pain, diarrhea, constipation and blood in stool.  Genitourinary: Positive for frequency (Foley ) and hematuria. Negative for dysuria, urgency and flank pain.  Musculoskeletal: Positive for myalgias, back pain and joint pain.  Skin: Negative for itching and rash.       Sacral wound stage IV and right medial heel-new unable to stage-3x4cm dark red/black intact area.   Neurological: Positive for weakness (generalized). Negative  for dizziness, tingling, tremors, sensory change, speech change, focal weakness, seizures and loss of consciousness.  Endo/Heme/Allergies: Negative for environmental allergies and polydipsia. Does not bruise/bleed easily.  Psychiatric/Behavioral: Positive for depression and memory loss. Negative for suicidal ideas and hallucinations. The patient is nervous/anxious. The patient does not have insomnia.      Medications: Patient's Medications  New Prescriptions   No medications on file  Previous Medications   LOPERAMIDE (IMODIUM) 2 MG CAPSULE    Take 1 capsule (2 mg total) by mouth 4 (four) times daily as needed for diarrhea or loose stools.   LORAZEPAM (ATIVAN) 0.5 MG TABLET    Take 1 tablet (0.5 mg total) by mouth every 4 (four) hours as needed.   MORPHINE SULFATE (MORPHINE CONCENTRATE) 10 MG / 0.5 ML CONCENTRATED SOLUTION    Take 0.25-0.5 mLs (5-10 mg total) by mouth every 2 (two) hours as needed for pain (or shortness of breath).   NUTRITIONAL SUPPLEMENTS (RESOURCE PO)    Take 90 mLs by mouth 2 (two) times daily.   ONDANSETRON (ZOFRAN) 4 MG TABLET    Take 1 tablet (4 mg total) by mouth every 8 (eight) hours as needed.   POLYETHYLENE GLYCOL (MIRALAX / GLYCOLAX) PACKET    Take 17 g by mouth daily.  Modified Medications   No medications on file  Discontinued Medications   No medications on file     Physical Exam: Physical Exam  Constitutional: He appears well-developed and well-nourished.  HENT:  Head: Normocephalic and atraumatic.  Eyes: Conjunctivae and EOM are normal. Pupils are equal, round, and reactive to light.  Neck: Normal range of motion. Neck supple. No JVD present. No thyromegaly present.  Cardiovascular: Normal rate and intact distal pulses.  An irregular rhythm present.  Murmur heard. Pulmonary/Chest: Effort normal. He has no wheezes. He has rales in the right lower field and the left lower field.  Abdominal: Soft. Bowel sounds are normal.  Genitourinary: Rectum normal.   Musculoskeletal:       Right shoulder: He exhibits decreased range of motion.       Left shoulder: He exhibits decreased range of motion and tenderness.       Right knee: Tenderness found.       Left knee: Tenderness found.  Lymphadenopathy:    He has no cervical adenopathy.  Skin:     Pressure ulcer, stage IV, wound vac and Santyl, Flexseal in place for diarrhea. Pressure wound bed is filled with granulated tissue.  New pressure area is intact with dark red/black medial right heel.   Psychiatric: His mood appears anxious. His affect is angry. His affect is not blunt, not labile and not inappropriate. His speech is not rapid and/or pressured, not delayed, not tangential and not slurred. He is agitated. He is not aggressive, not hyperactive, not slowed, not withdrawn and not combative. Thought content is not paranoid and not delusional. Cognition and memory are impaired. He expresses impulsivity. He does not express inappropriate judgment. He exhibits a depressed mood. He exhibits abnormal recent memory.     Filed Vitals:   10/12/12 1019  BP: 130/70  Pulse: 82  Temp: 97.7 F (36.5 C)  TempSrc: Tympanic  Resp: 16      Labs reviewed: Basic Metabolic Panel:  Recent Labs  95/28/41 1440 10/26/11 2047  09/16/12 2330 09/17/12 0117  10/04/12 0530 10/05/12 0500 10/06/12 0439 10/11/12  NA 137  --   < > 133*  --   < > 137 137 137 141  K 4.5  --   < > 3.1*  --   < > 4.9 4.4 4.5 5.6*  CL 101  --   < > 102  --   < > 102 104 103  --   CO2 29  --   < > 22  --   < > 27 25 25   --   GLUCOSE 124*  --   < > 348*  --   < > 102* 132* 123*  --   BUN 21  --   < > 25*  --   < > 41* 43* 42* 77*  CREATININE 0.59  --   < > 0.57  --   < > 1.94* 1.90* 1.98* 2.6*  CALCIUM 8.7  --   < > 7.7*  --   < > 9.4 9.1 9.1  --   MG  --   --   --   --  1.5  --   --   --   --   --   TSH  --  1.544  --   --   --   --   --   --   --   --   < > = values in this interval not displayed.  Liver Function  Tests:  Recent Labs  10/27/11 0345 09/10/12 1004 10/03/12 1533  AST 13 8 11   ALT 10 9 9   ALKPHOS 55 92  81  BILITOT 0.8 0.3 0.3  PROT 6.9 7.7 6.8  ALBUMIN 3.2* 2.1* 1.6*    CBC:  Recent Labs  11/12/11 1227 09/10/12 1004  10/03/12 1533 10/04/12 0530 10/06/12 0439 10/11/12  WBC 9.4 45.4*  < > 10.0 7.0 6.6 9.1  NEUTROABS 6.7 43.6*  --  8.5*  --   --   --   HGB 10.8* 11.6*  < > 8.8* 8.2* 8.3* 8.5*  HCT 33.7* 33.4*  < > 27.5* 25.6* 26.1* 27*  MCV 91.1 81.3  < > 86.5 86.2 85.0  --   PLT 259.0 427*  < > 318 285 298 256  < > = values in this interval not displayed.  Anemia Panel: No results found for this basename: IRON, FOLATE, VITAMINB12,  in the last 8760 hours  Significant Diagnostic Results:     Assessment/Plan Normocytic anemia Hgb 8.5 10/11/12, hematuria recurred this am.   ICD (implantable cardiac defibrillator) in place Rate controlled  Chronic systolic heart failure Still compensated with occasional hacking cough, but bibasilar rales remains as prior.   Pressure ulcer stage IV Non change  AKI (acute kidney injury) Related to poor oral intakes and fluid depletion--IVF 1/2NS 2 liters for now. Bun/creat 43/2.63 and K 5.6 10/11/12--f/u BMP pending upon completion of IVF  ICD (implantable cardiac defibrillator) infection Has been turned off  Chronic pain syndrome Morphine is available to him  Depression Cymbalta seems help.       Family/ staff Communication: IVF for short period of time   Goals of care: comfort measures.   Labs/tests ordered BMP

## 2012-10-12 NOTE — Assessment & Plan Note (Signed)
Non change

## 2012-10-12 NOTE — Assessment & Plan Note (Addendum)
Related to poor oral intakes and fluid depletion--IVF 1/2NS 2 liters for now. Bun/creat 43/2.63 and K 5.6 10/11/12--f/u BMP pending upon completion of IVF

## 2012-10-12 NOTE — Assessment & Plan Note (Signed)
Cymbalta seems help.

## 2012-10-12 NOTE — Assessment & Plan Note (Signed)
Still compensated with occasional hacking cough, but bibasilar rales remains as prior.

## 2012-10-12 NOTE — Assessment & Plan Note (Signed)
Hgb 8.5 10/11/12, hematuria recurred this am.

## 2012-10-12 NOTE — Assessment & Plan Note (Signed)
Rate controlled 

## 2012-10-12 NOTE — Assessment & Plan Note (Signed)
Has been turned off

## 2012-10-12 NOTE — Assessment & Plan Note (Signed)
Morphine is available to him

## 2012-11-13 DEATH — deceased

## 2014-05-07 ENCOUNTER — Encounter: Payer: Self-pay | Admitting: Internal Medicine

## 2014-05-07 DIAGNOSIS — D649 Anemia, unspecified: Secondary | ICD-10-CM | POA: Insufficient documentation

## 2014-05-07 DIAGNOSIS — R531 Weakness: Secondary | ICD-10-CM | POA: Insufficient documentation

## 2014-05-07 DIAGNOSIS — R197 Diarrhea, unspecified: Secondary | ICD-10-CM | POA: Insufficient documentation

## 2014-05-07 DIAGNOSIS — Z515 Encounter for palliative care: Secondary | ICD-10-CM | POA: Insufficient documentation

## 2014-05-07 NOTE — Progress Notes (Signed)
Patient ID: Troy Cook, male   DOB: 10/01/1931, 80 y.o.   MRN: [id number]    HISTORY AND PHYSICAL  Location:  [facility]   Room 25 Place of Service: SNF (31)   Extended Emergency Contact Information Primary Emergency Contact: Strain,[name] Address: [address]          [place], [place] [address] [place] Home Phone: [phone] Relation: Spouse  Advanced Directive information  DNR  Chief Complaint  Patient presents with  . Medical Management of Chronic Issues    HPI:  Patient was readmitted 09/22/12 to skilled nursing facility at [facility].  He was hospitalized 09/10/12 through 09/22/12. Problems during that admission included sacral decubitus stage IV on a wound VAC, sepsis with MRSA, anemia, dyslipidemia, chronic systolic heart failure, UTI, diarrhea, delirium, cirrhosis. His sepsis was treated with Septra DS vancomycin and Zosyn.  Patient's overall condition is quite poor as a known history of Colon cancer, cancer of the rectum, and the prostate. His Intake Is Very Poor. He Has Been Losing Weight. He Has [employer]. There Is Dyspnea and Diarrhea. He complains of knee ains and pain at the sacral decubitus.  Past Medical History  Diagnosis Date  . Atrial fibrillation   . Diabetes mellitus   . Hypertension   . Coronary artery disease   . Hyperlipemia   . ICD (implantable cardiac defibrillator) in place   . CHF (congestive heart failure)   . Ulcer of sacral region 10/03/2012    STAGE 4 DECUBITIS  . Arthritis     KNEES & LEGS  . Hospice care patient   . Sepsis 09/10/12  . Anemia   . Chronic systolic heart failure   . Urinary tract infection   . Constipation   . Diarrhea   . Pain in both knees   . Weakness   . Ischemic heart disease   . BPH (benign prostatic hyperplasia)   . Cirrhosis   . Delirium   . S/P unilateral BKA (below knee amputation)     Left  . Cardiomyopathy   . Weight loss     Past Surgical History    Procedure Laterality Date  . Cholecystectomy    . Cardiac defibrillator placement  11/19/2011    single chamber  . Cardiac catheterization    . Tee without cardioversion N/A 09/14/2012    Procedure: TRANSESOPHAGEAL ECHOCARDIOGRAM (TEE);  Surgeon: Corky Crafts, MD;  Location: [facility] ENDOSCOPY;  Service: Cardiovascular;  Laterality: N/A;  Andrea/ Varanasi  . Picc line      Patient Care Team: Duane Lope, MD as PCP - General (Family Medicine)  History   Social History  . Marital Status: Married    Spouse Name: N/A    Number of Children: N/A  . Years of Education: N/A   Occupational History  . Not on file.   Social History Main Topics  . Smoking status: Former Smoker    Quit date: 02/13/2001  . Smokeless tobacco: Never Used  . Alcohol Use: No  . Drug Use: No  . Sexual Activity: No   Other Topics Concern  . Not on file   Social History Narrative  . No narrative on file     reports that he quit smoking about 13 years ago. He has never used smokeless tobacco. He reports that he does not drink alcohol or use illicit drugs.  No family history on file. No family status information on file.     There is no  immunization history on file for this patient.  No Known Allergies  Medications: Patient's Medications  New Prescriptions   No medications on file  Previous Medications   LOPERAMIDE (IMODIUM) 2 MG CAPSULE    Take 1 capsule (2 mg total) by mouth 4 (four) times daily as needed for diarrhea or loose stools.   LORAZEPAM (ATIVAN) 0.5 MG TABLET    Take 1 tablet (0.5 mg total) by mouth every 4 (four) hours as needed.   MORPHINE SULFATE (MORPHINE CONCENTRATE) 10 MG / 0.5 ML CONCENTRATED SOLUTION    Take 0.25-0.5 mLs (5-10 mg total) by mouth every 2 (two) hours as needed for pain (or shortness of breath).   NUTRITIONAL SUPPLEMENTS (RESOURCE PO)    Take 90 mLs by mouth 2 (two) times daily.   ONDANSETRON (ZOFRAN) 4 MG TABLET    Take 1 tablet (4 mg total) by mouth every 8  (eight) hours as needed.   POLYETHYLENE GLYCOL (MIRALAX / GLYCOLAX) PACKET    Take 17 g by mouth daily.  Modified Medications   No medications on file  Discontinued Medications   No medications on file    Review of Systems  Constitutional: Positive for fatigue. Negative for activity change and appetite change.  HENT: Negative.   Eyes: Negative.   Respiratory: Negative.   Cardiovascular: Positive for leg swelling.       Ischemic heart disease  Gastrointestinal: Negative.   Endocrine: Negative.   Genitourinary: Positive for dysuria, urgency and frequency.  Musculoskeletal: Positive for arthralgias and gait problem.       Bilateral knee pains  Skin: Positive for wound. Rash: sacral presssure ulcer stage IV.       Sacral decubitus  Allergic/Immunologic: Negative.   Neurological: Negative.   Hematological: Bruises/bleeds easily.  Psychiatric/Behavioral: Positive for behavioral problems.    Filed Vitals:   09/29/12 1602  BP: 110/60  Pulse: 76  Temp: 96.9 F (36.1 C)  Resp: 18  Height: 5\' 9"  (1.753 m)  Weight: 226 lb (102.513 kg)   Body mass index is 33.36 kg/(m^2).  Physical Exam  Constitutional: He appears well-developed and well-nourished.  HENT:  Head: Normocephalic and atraumatic.  Eyes: Conjunctivae and EOM are normal. Pupils are equal, round, and reactive to light.  Neck: Normal range of motion. Neck supple. No JVD present. No thyromegaly present.  Cardiovascular: Normal rate and intact distal pulses.  An irregular rhythm present.  Murmur heard. AF   Pulmonary/Chest: Effort normal and breath sounds normal. He has no wheezes. He has no rales.  Abdominal: Soft. Bowel sounds are normal. He exhibits no distension and no mass. There is no tenderness.  Genitourinary: Rectum normal.  Musculoskeletal: Normal range of motion. He exhibits no edema or tenderness.  Lymphadenopathy:    He has no cervical adenopathy.  Skin:     Pressure ulcer, stage IV, wound vac and  Santyl, Flexseal in place for diarrhea. Pressure wound bed is filled with granulated tissue.  New pressure area is intact with dark red/black medial right heel.   Psychiatric: He has a normal mood and affect. His behavior is normal. His affect is not blunt, not labile and not inappropriate. His speech is not rapid and/or pressured, not delayed, not tangential and not slurred. He is not aggressive, not hyperactive, not slowed, not withdrawn and not combative. Thought content is not paranoid and not delusional. Cognition and memory are impaired. He expresses impulsivity. He does not express inappropriate judgment. He exhibits abnormal recent memory.     Labs  reviewed: Nursing Home on 09/28/2012  Component Date Value Ref Range Status  . Hemoglobin 09/27/2012 8.6* 13.5 - 17.5 g/dL Final  . HCT 16/03/9603 27* 41 - 53 % Final  . Platelets 09/27/2012 363  150 - 399 K/L Final  . WBC 09/27/2012 5.9   Final  . Glucose 09/27/2012 105   Final  . BUN 09/27/2012 29* 4 - 21 mg/dL Final  . Creatinine 54/02/8118 1.2  0.6 - 1.3 mg/dL Final  . Potassium 14/78/2956 4.7  3.4 - 5.3 mmol/L Final  . Sodium 09/27/2012 138  137 - 147 mmol/L Final  Admission on 09/10/2012, Discharged on 09/22/2012  No results displayed because visit has over 200 results.    Orders Only on 09/05/2012  Component Date Value Ref Range Status  . DEVICE MODEL ICD 09/05/2012 213086   Final  . DEV-0014ICD 09/05/2012 Lewayne Bunting   M.D.   Final  . DEV-0020ICD 09/05/2012 N   Final  . [id number]-[id number] 09/05/2012 Lewayne Bunting   M.D.   Final  . EVAL-0005E8 09/05/2012 [facility] Patient Management System   Final  . [id number]-[id number] 09/05/2012    Final                   Value:ICD remote received. Sensing, impedances consistent with previous device measurements. Histograms appropriate for patient and level of activity. 5 NSVT episodes.  All other diagnostic data reviewed and is appropriate and stable for patient. Real time EGM                           demonstrates appropriate sensing. Estimated longevity 12 years.  Due GT 11-2012  . RV LEAD IMPEDENCE ICD 09/05/2012 446   Final  . HV IMPEDENCE 09/05/2012 90   Final  . VENTRICULAR PACING ICD 09/05/2012 0   Final  . RV LEAD AMPLITUDE 09/05/2012 9.7   Final  . BRDY-0001RV 09/05/2012 VVI   Final  . BRDY-0002RV 09/05/2012 40   Final  . BRDY-0005RV 09/05/2012 /   Final  . BRDY-0009RV 09/05/2012 No   Final  . BRDY-0010RV 09/05/2012 *RVRP -- - 250   Final  . TACH-0003RV 09/05/2012 Monitor + Therapy   Final  . Arvid Right 09/05/2012 ATP + 6 Shocks   Final  . TZON-0003FASTVT 09/05/2012 300.0   Final  . TZAT-0001FASTVT 09/05/2012 1   Final  . TZAT-0002FASTVT 09/05/2012 Y   Final  . TZAT-0003FASTVT 09/05/2012 Scan   Final  . TZAT-0013FASTVT 09/05/2012 2   Final  . TZAT-0018FASTVT 09/05/2012 N   Final  . TZAT-0001FASTVT 09/05/2012 2   Final  . TZAT-0002FASTVT 09/05/2012 Y   Final  . TZAT-0003FASTVT 09/05/2012 Ramp   Final  . TZAT-0013FASTVT 09/05/2012 2   Final  . TZAT-0018FASTVT 09/05/2012 N   Final  . TZST-0001FASTVT 09/05/2012 3   Final  . TZST-0002FASTVT 09/05/2012 Y   Final  . TZST-0003FASTVT 09/05/2012 26   Final  . TZST-0001FASTVT 09/05/2012 4   Final  . TZST-0002FASTVT 09/05/2012 Y   Final  . TZST-0003FASTVT 09/05/2012 41   Final  . TZST-0001FASTVT 09/05/2012 5   Final  . TZST-0002FASTVT 09/05/2012 Y   Final  . TZST-0003FASTVT 09/05/2012 41   Final  . TZST-0001FASTVT 09/05/2012 6   Final  . TZST-0002FASTVT 09/05/2012 Y   Final  . TZST-0003FASTVT 09/05/2012 41   Final  . TZST-0001FASTVT 09/05/2012 7   Final  . TZST-0002FASTVT 09/05/2012 Y   Final  . TZST-0003FASTVT 09/05/2012 41   Final  .  TZST-0001FASTVT 09/05/2012 8   Final  . TZST-0002FASTVT 09/05/2012 Y   Final  . TZST-0003FASTVT 09/05/2012 41   Final  Admission on 07/21/2012, Discharged on 07/21/2012  Component Date Value Ref Range Status  . Color, Urine 07/21/2012 RED* YELLOW Final   BIOCHEMICALS MAY BE AFFECTED BY  COLOR  . APPearance 07/21/2012 TURBID* CLEAR Final  . Specific Gravity, Urine 07/21/2012 1.020  1.005 - 1.030 Final  . pH 07/21/2012 5.5  5.0 - 8.0 Final  . Glucose, UA 07/21/2012 250* NEGATIVE mg/dL Final  . Hgb urine dipstick 07/21/2012 LARGE* NEGATIVE Final  . Bilirubin Urine 07/21/2012 LARGE* NEGATIVE Final  . Ketones, ur 07/21/2012 40* NEGATIVE mg/dL Final  . Protein, ur 40/98/1191 >300* NEGATIVE mg/dL Final  . Urobilinogen, UA 07/21/2012 1.0  0.0 - 1.0 mg/dL Final  . Nitrite 47/82/9562 POSITIVE* NEGATIVE Final  . Leukocytes, UA 07/21/2012 LARGE* NEGATIVE Final  . Specimen Description 07/21/2012 URINE, CLEAN CATCH   Final  . Special Requests 07/21/2012 NONE   Final  . Culture  Setup Time 07/21/2012 07/22/2012 05:59   Final  . Colony Count 07/21/2012 >=100,000 COLONIES/ML   Final  . Culture 07/21/2012 ESCHERICHIA COLI   Final  . Report Status 07/21/2012 07/23/2012 FINAL   Final  . Organism ID, Bacteria 07/21/2012 ESCHERICHIA COLI   Final  . WBC, UA 07/21/2012 TOO NUMEROUS TO COUNT  <3 WBC/hpf Final  . RBC / HPF 07/21/2012 TOO NUMEROUS TO COUNT  <3 RBC/hpf Final  . Bacteria, UA 07/21/2012 MANY* RARE Final  . Urine-Other 07/21/2012 FIELD OBSCURED BY WBC'S   Final   FIELD OBSCURED BY RBC'S    Dg Chest 1 View  10/03/2012   *RADIOLOGY REPORT*  Clinical Data: Fevers  CHEST - 1 VIEW  Comparison: 09/16/2012  Findings: The pacing device and right-sided PICC line are again noted.  The lungs are clear bilaterally.  No pneumothorax or sizable effusion is seen.  Degenerative change of the thoracic spine is noted.  IMPRESSION: No acute abnormality is seen.   Original Report Authenticated By: Alcide Clever, M.D.   Ct Head Wo Contrast  10/03/2012   *RADIOLOGY REPORT*  Clinical Data: Altered level of consciousness.  CT HEAD WITHOUT CONTRAST  Technique:  Contiguous axial images were obtained from the base of the skull through the vertex without contrast.  Comparison: None.  Findings: There are lacunar  infarct of the right cerebellum appears remote.  Bilateral periventricular and subcortical white matter changes are evident.  No acute cortical infarct, hemorrhage, or mass lesion is present.  The ventricles are proportionate to the degree of atrophy.  No significant extra-axial fluid collection is present.  The paranasal sinuses and mastoid air cells are clear.  The osseous skull is intact.  No significant extracranial soft tissue injuries are evident.  IMPRESSION:  1.  No acute intracranial abnormality or significant interval change. 2.  Mild generalized atrophy likely within normal limits for age. 3.  Mild to moderate periventricular and subcortical white matter changes bilaterally, likely related to the sequelae of chronic microvascular ischemia. 4.  Remote lacunar infarct of the right cerebellum.   Original Report Authenticated By: Marin Roberts, M.D.   Dg Chest Port 1 View  10/04/2012   *RADIOLOGY REPORT*  Clinical Data: Confirm line placement  PORTABLE CHEST - 1 VIEW  Comparison: Prior chest x-ray 10/03/2012  Findings: New left upper extremity approach PICC.  The catheter tip projects over the distal SVC.  Stable position of left subclavian approach cardiac rhythm maintenance device with the  lead projecting over the right ventricle.  The right upper extremity PICC has been removed.  Stable borderline cardiomegaly.  Aortic atherosclerosis is noted.  Chronic bibasilar opacities appears similar to prior. Mild vascular congestion without overt edema.  IMPRESSION:  1.  The tip of the new left upper extremity PICC projects over the distal SVC in good position. 2.  The right upper extremity PICC has been removed. 3.  Otherwise, no significant interval change in the appearance of the chest.   Original Report Authenticated By: Malachy MoanHeath McCullough, M.D.     Assessment/Plan  1. Automatic implantable cardioverter-defibrillator in situ This has been turned off  2. Essential hypertension Controlled  3.  MRSA (methicillin resistant Staphylococcus aureus) septicemia Has completed antibiotic treatment  4. Pressure ulcer stage IV Daily cleansing  5. Pressure ulcer of heel, right, unstageable Daily cleansing and pressure relief  6. Type 2 diabetes mellitus with diabetic chronic kidney disease controlled  7. Weakness Patient is quite frail and weak. His condition is poor enough that I'm not sure he will recover. He will be given a chance to participate in physical therapy.  8. Diarrhea C. difficile negative  9. Anemia, unspecified anemia type Continue routine lab follow-up  10. Hospice care patient Poor condition of patient warrants continuation of hospice services

## 2014-05-24 ENCOUNTER — Encounter (HOSPITAL_COMMUNITY): Payer: Self-pay | Admitting: Internal Medicine

## 2014-08-19 IMAGING — CR DG ABDOMEN ACUTE W/ 1V CHEST
3 series · 3 of 3 positions shown · non-contrast
Comparison: 11/20/2011

CLINICAL DATA: Abdominal pain.  Constipation.  UTI.

ACUTE ABDOMEN SERIES (ABDOMEN 2 VIEW & CHEST 1 VIEW)

[w abdomen decub]
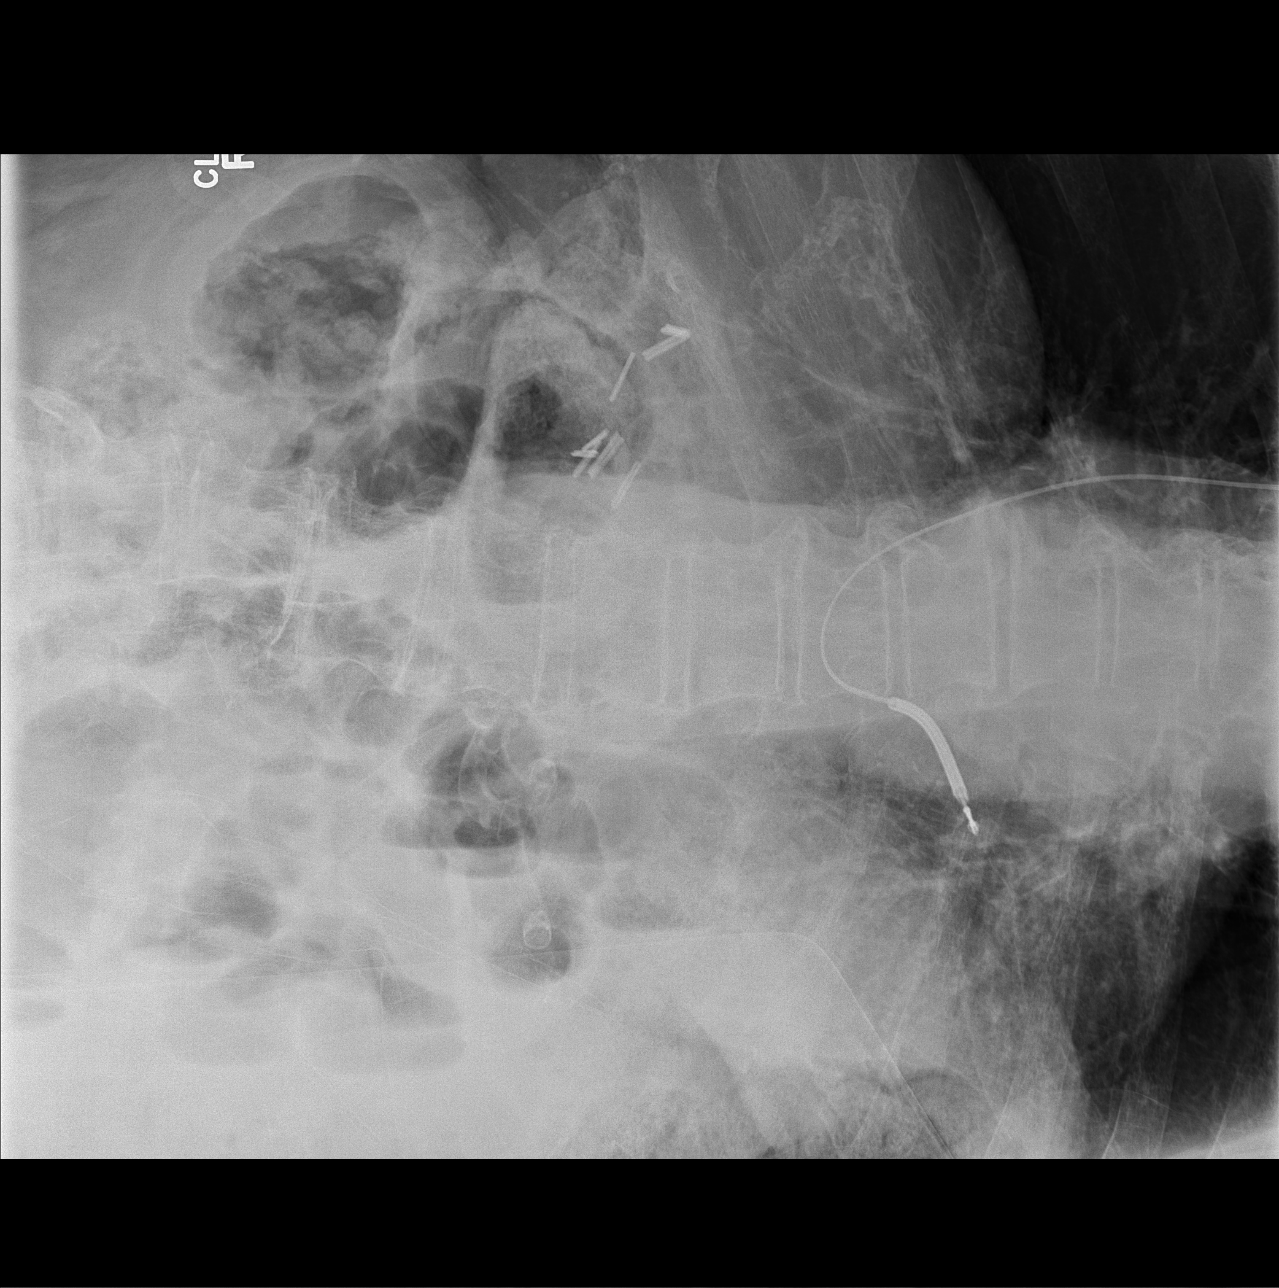

[x abdomen supine]
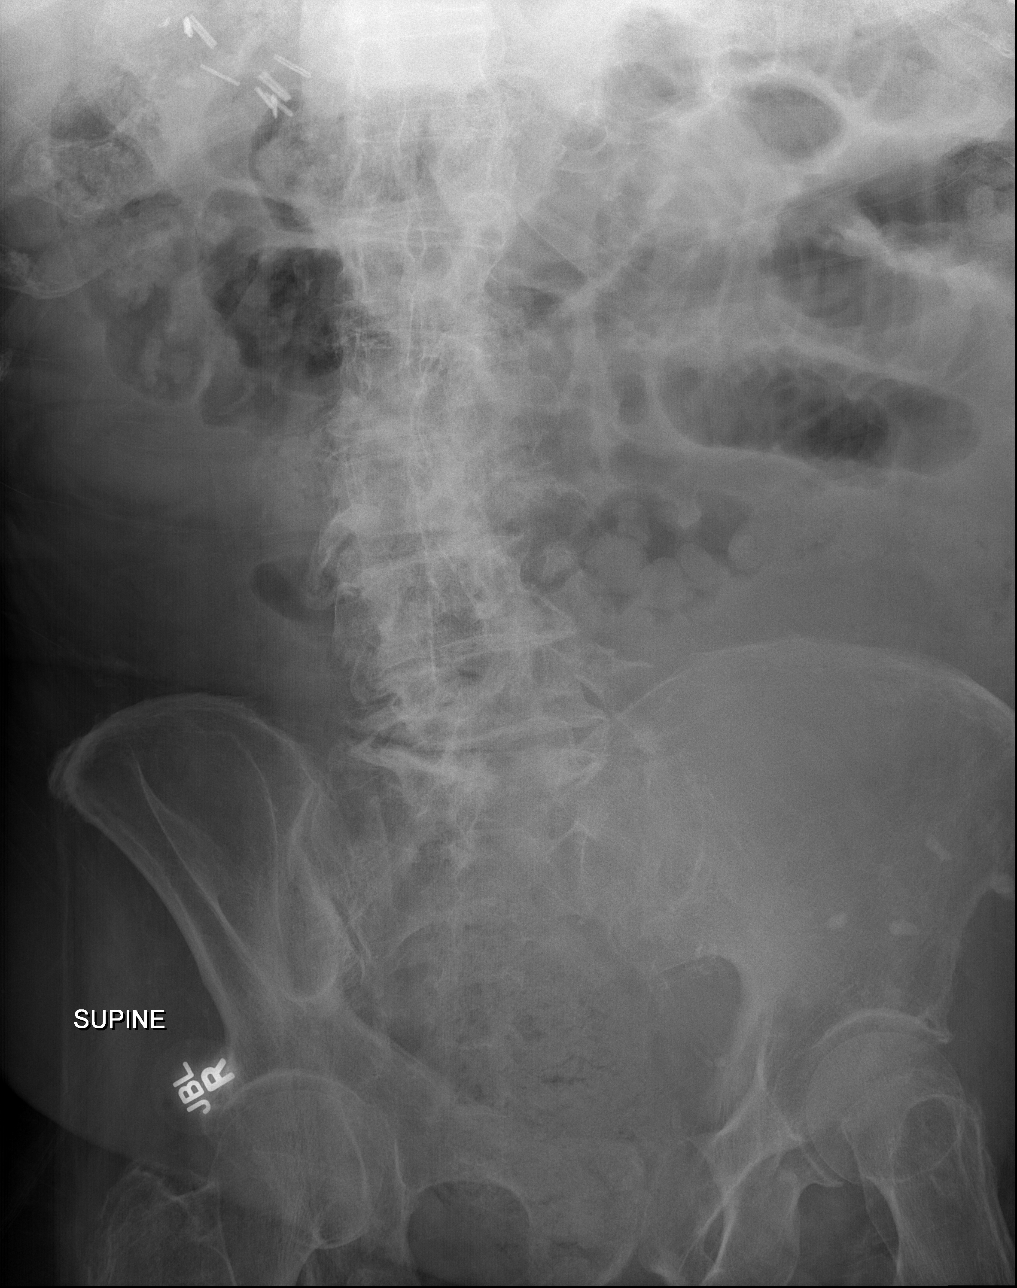

[x chest ap]
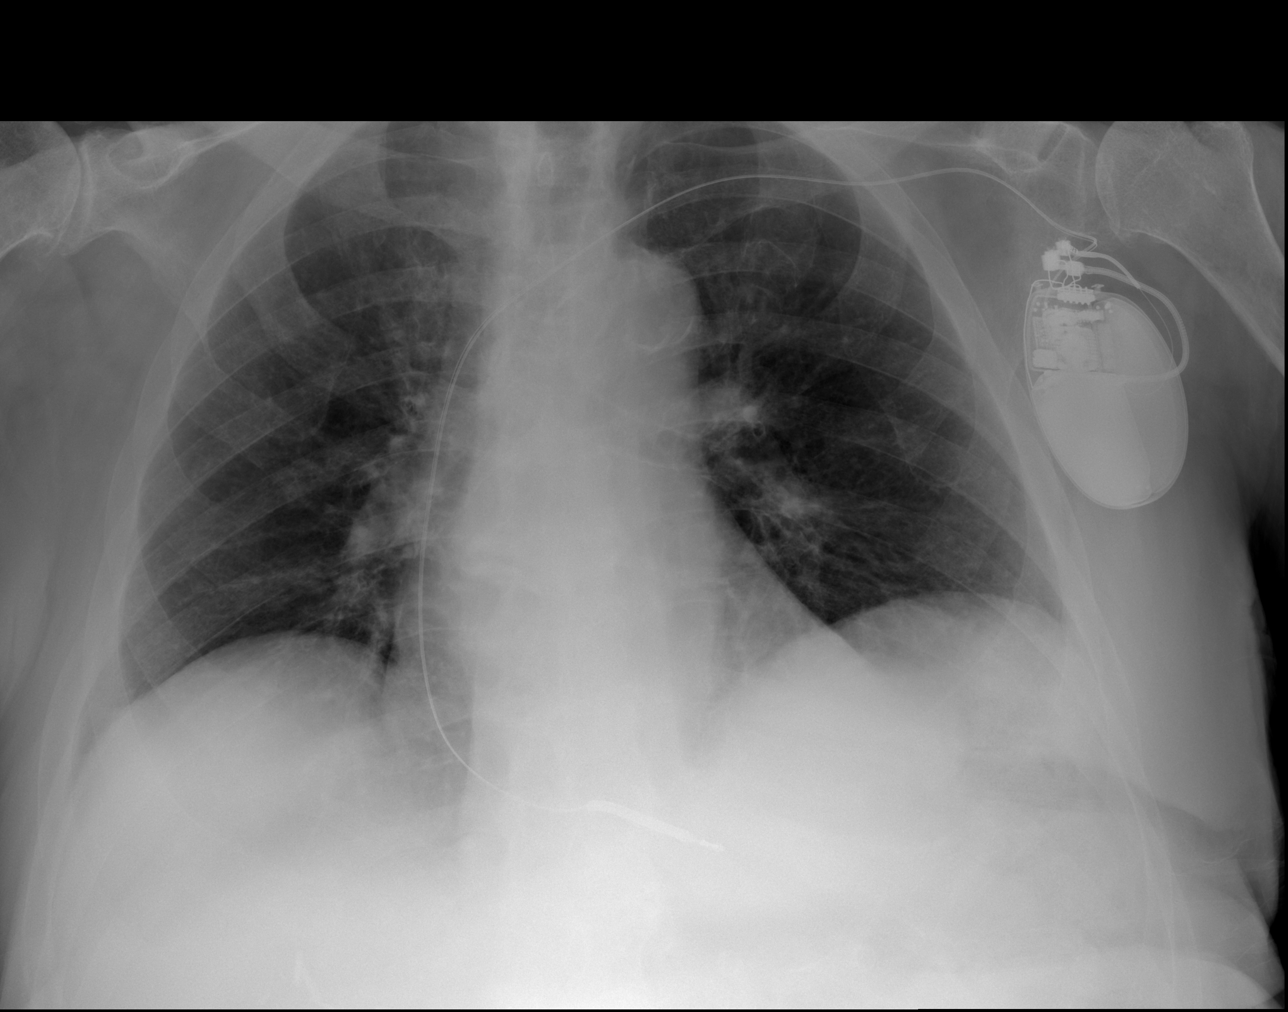

[3 of 3 positions shown; findings below may reference images not displayed]

FINDINGS: AICD unchanged in position.  Mildly low lung volumes
noted.  Atherosclerotic calcification of the aortic arch is
present.  There is degenerative glenohumeral arthropathy
bilaterally.

No free intraperitoneal gas is observed on the left side down
lateral decubitus view.  There are air-fluid levels scattered in
its of dilated small bowel measuring up to 3.7 cm in diameter.
Some of these air-fluid levels are probably at differing vertical
heights in the same loop of small bowel.

There is a paucity of bowel gas in the pelvis, although more rectal
stool is visible.

Prominent lumbar spondylosis observed.  Dextroconvex lumbar
scoliosis.
IMPRESSION: 1.  Abnormal air-fluid levels in dilated small bowel.  Appearance
is suspicious for small bowel obstruction.
2.  Paucity of bowel gas in the pelvis may be due to more proximal
obstruction or less likely a mass in the pelvis.  However, there is
formed stool in the sigmoid colon and rectum.
3.  No free intraperitoneal gas.
4.  Atherosclerosis.
5.  Lumbar spondylosis.

## 2014-08-25 IMAGING — CR DG CHEST 1V PORT
1 series · 1 of 1 positions shown · non-contrast
Comparison: 09/15/2012

CLINICAL DATA: Complaint of acute dyspnea.

PORTABLE CHEST - 1 VIEW

[AP]
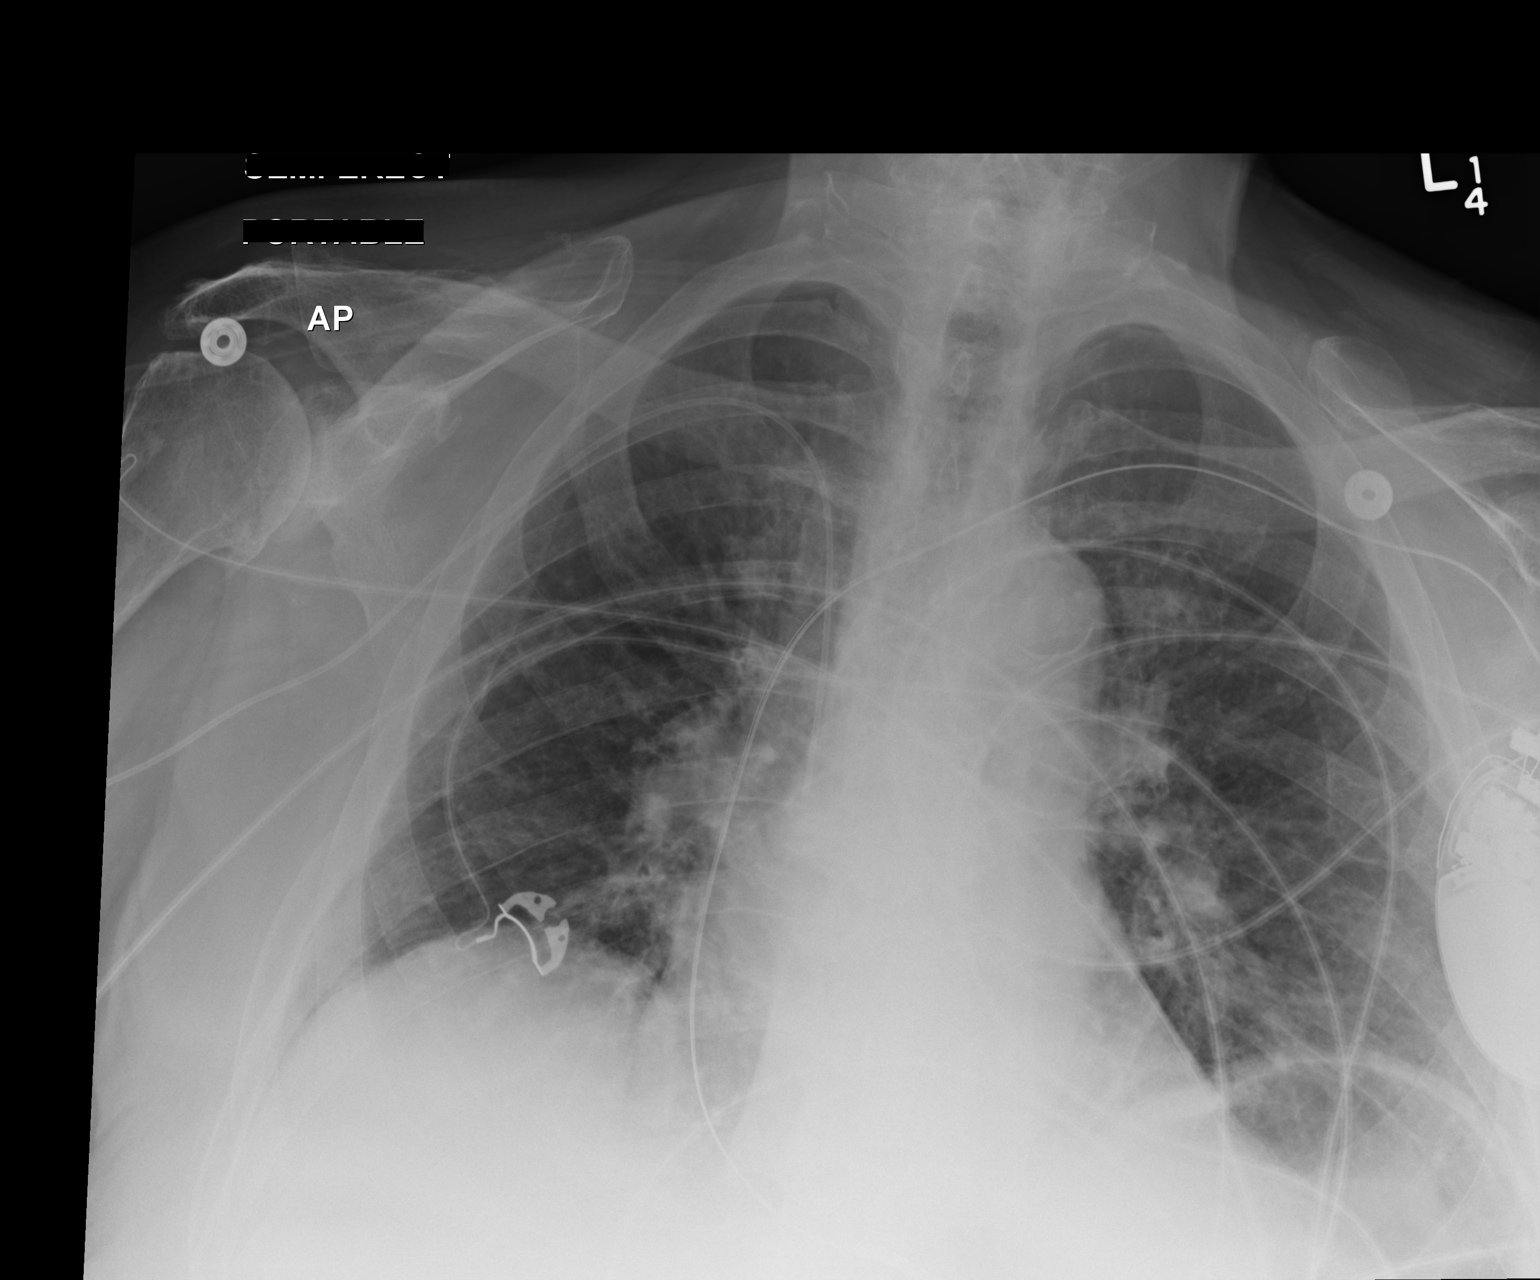

[1 of 1 positions shown; findings below may reference images not displayed]

FINDINGS: Stable appearance of cardiac pacemaker and right central
venous catheters since previous study.  Shallow inspiration.  Heart
size and pulmonary vascularity are normal for age for a effort.
There is suggestion of atelectasis or infiltration in the left lung
base.  No blunting of costophrenic angles.  No pneumothorax.
Calcification of the aorta.  Degenerative changes in the right
shoulder.
IMPRESSION: No significant change since previous study.  Shallow inspiration
with atelectasis in the left lung base.

## 2014-09-11 IMAGING — CR DG CHEST 1V
1 series · 1 of 1 positions shown · non-contrast
Comparison: 09/16/2012

CLINICAL DATA: Fevers

CHEST - 1 VIEW

[view not recorded]
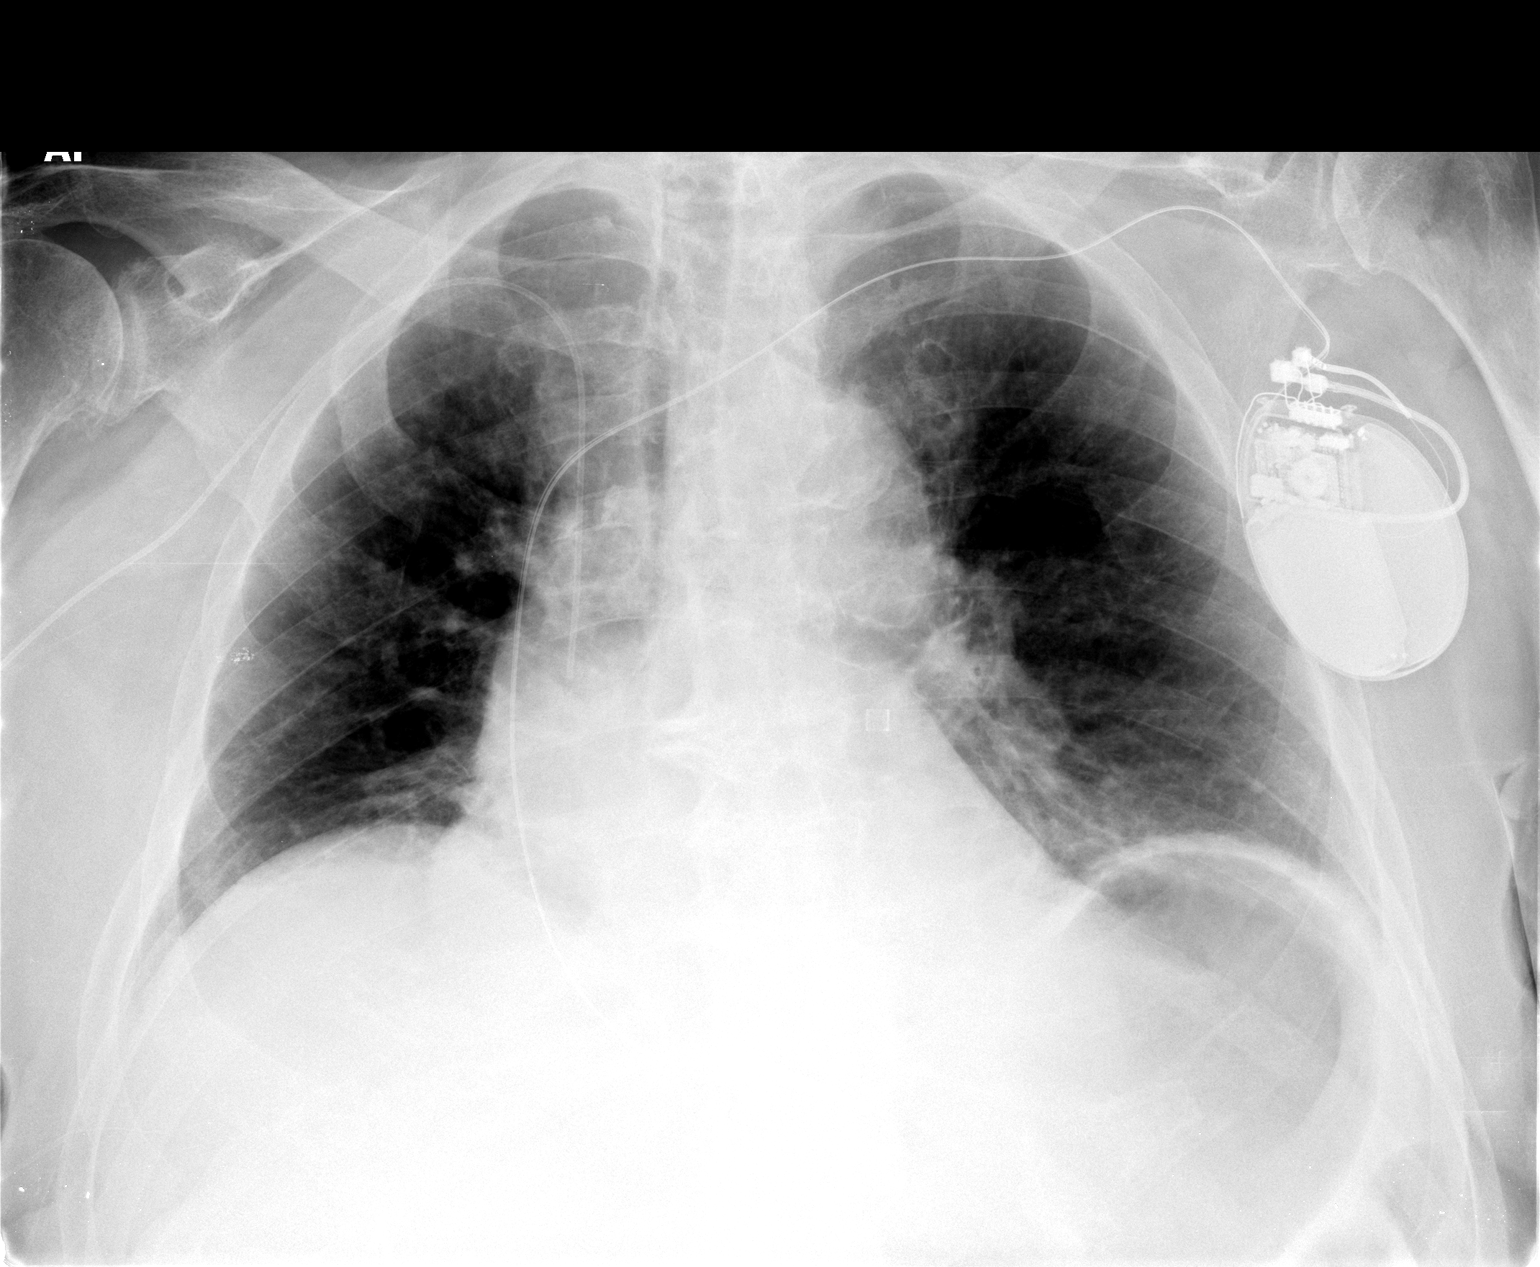

[1 of 1 positions shown; findings below may reference images not displayed]

FINDINGS: The pacing device and right-sided PICC line are again
noted.  The lungs are clear bilaterally.  No pneumothorax or
sizable effusion is seen.  Degenerative change of the thoracic
spine is noted.
IMPRESSION: No acute abnormality is seen.
